# Patient Record
Sex: Female | Born: 1945 | Race: White | Hispanic: No | Marital: Single | Smoking: Never smoker
Health system: Southern US, Community
[De-identification: ages and names within clinical notes are randomized; demographics above are authoritative.]

## PROBLEM LIST (undated history)

## (undated) DIAGNOSIS — F329 Major depressive disorder, single episode, unspecified: Secondary | ICD-10-CM

## (undated) DIAGNOSIS — G4733 Obstructive sleep apnea (adult) (pediatric): Secondary | ICD-10-CM

## (undated) DIAGNOSIS — I10 Essential (primary) hypertension: Secondary | ICD-10-CM

## (undated) DIAGNOSIS — Z930 Tracheostomy status: Secondary | ICD-10-CM

## (undated) DIAGNOSIS — N189 Chronic kidney disease, unspecified: Secondary | ICD-10-CM

## (undated) DIAGNOSIS — E119 Type 2 diabetes mellitus without complications: Secondary | ICD-10-CM

---

## 2004-10-13 ENCOUNTER — Ambulatory Visit: Payer: Self-pay | Admitting: Physician Assistant

## 2004-11-29 ENCOUNTER — Other Ambulatory Visit: Payer: Self-pay

## 2004-12-28 ENCOUNTER — Ambulatory Visit: Payer: Self-pay | Admitting: Unknown Physician Specialty

## 2005-01-14 ENCOUNTER — Emergency Department: Payer: Self-pay | Admitting: Unknown Physician Specialty

## 2005-08-31 ENCOUNTER — Other Ambulatory Visit: Payer: Self-pay

## 2005-08-31 ENCOUNTER — Inpatient Hospital Stay: Payer: Self-pay | Admitting: Internal Medicine

## 2005-09-05 ENCOUNTER — Other Ambulatory Visit: Payer: Self-pay

## 2006-02-27 ENCOUNTER — Ambulatory Visit: Payer: Self-pay | Admitting: *Deleted

## 2006-04-04 ENCOUNTER — Ambulatory Visit: Payer: Self-pay | Admitting: Otolaryngology

## 2006-06-11 ENCOUNTER — Other Ambulatory Visit: Payer: Self-pay

## 2006-06-11 ENCOUNTER — Ambulatory Visit: Payer: Self-pay | Admitting: Unknown Physician Specialty

## 2006-06-18 ENCOUNTER — Other Ambulatory Visit: Payer: Self-pay

## 2006-06-18 ENCOUNTER — Inpatient Hospital Stay: Payer: Self-pay | Admitting: Unknown Physician Specialty

## 2008-10-17 ENCOUNTER — Emergency Department: Payer: Self-pay | Admitting: Emergency Medicine

## 2009-02-07 ENCOUNTER — Inpatient Hospital Stay: Payer: Self-pay | Admitting: Vascular Surgery

## 2009-02-20 ENCOUNTER — Inpatient Hospital Stay: Payer: Self-pay | Admitting: Internal Medicine

## 2009-03-17 ENCOUNTER — Ambulatory Visit: Payer: Self-pay | Admitting: Surgery

## 2009-05-26 ENCOUNTER — Ambulatory Visit: Payer: Self-pay | Admitting: Family Medicine

## 2009-10-14 ENCOUNTER — Ambulatory Visit: Payer: Self-pay | Admitting: Unknown Physician Specialty

## 2009-10-26 ENCOUNTER — Inpatient Hospital Stay: Payer: Self-pay | Admitting: Unknown Physician Specialty

## 2011-03-11 ENCOUNTER — Ambulatory Visit: Payer: Self-pay | Admitting: Internal Medicine

## 2011-03-26 ENCOUNTER — Inpatient Hospital Stay: Payer: Self-pay | Admitting: Internal Medicine

## 2011-04-11 ENCOUNTER — Ambulatory Visit: Payer: Self-pay | Admitting: Internal Medicine

## 2011-04-12 LAB — CBC WITH DIFFERENTIAL/PLATELET
Basophil %: 0.1 %
Eosinophil %: 0.8 %
HCT: 35.6 % (ref 35.0–47.0)
HGB: 11.6 g/dL — ABNORMAL LOW (ref 12.0–16.0)
Lymphocyte %: 5.9 %
MCHC: 32.6 g/dL (ref 32.0–36.0)
Monocyte %: 5.9 %
Neutrophil #: 10.4 10*3/uL — ABNORMAL HIGH (ref 1.4–6.5)
Neutrophil %: 87.3 %
RBC: 3.94 10*6/uL (ref 3.80–5.20)

## 2011-04-12 LAB — URINALYSIS, COMPLETE
Bilirubin,UR: NEGATIVE
Glucose,UR: NEGATIVE mg/dL (ref 0–75)
Leukocyte Esterase: NEGATIVE
Nitrite: NEGATIVE
RBC,UR: 328 /HPF (ref 0–5)
Specific Gravity: 1.012 (ref 1.003–1.030)
Squamous Epithelial: 2
WBC UR: 10 /HPF (ref 0–5)

## 2011-04-12 LAB — BASIC METABOLIC PANEL
Calcium, Total: 10.5 mg/dL — ABNORMAL HIGH (ref 8.5–10.1)
Co2: 31 mmol/L (ref 21–32)
Creatinine: 0.8 mg/dL (ref 0.60–1.30)
EGFR (African American): >60
EGFR (Non-African Amer.): >60
Glucose: 238 mg/dL — ABNORMAL HIGH (ref 65–99)
Potassium: 3.6 mmol/L (ref 3.5–5.1)
Sodium: 150 mmol/L — ABNORMAL HIGH (ref 136–145)

## 2011-04-13 LAB — BASIC METABOLIC PANEL
Anion Gap: 11 (ref 7–16)
BUN: 22 mg/dL — ABNORMAL HIGH (ref 7–18)
Calcium, Total: 10.2 mg/dL — ABNORMAL HIGH (ref 8.5–10.1)
Chloride: 105 mmol/L (ref 98–107)
EGFR (African American): >60
EGFR (Non-African Amer.): >60
Glucose: 261 mg/dL — ABNORMAL HIGH (ref 65–99)
Osmolality: 305 (ref 275–301)

## 2011-04-13 LAB — CBC WITH DIFFERENTIAL/PLATELET
Basophil #: 0 10*3/uL (ref 0.0–0.1)
Eosinophil #: 0.1 10*3/uL (ref 0.0–0.7)
HCT: 36.6 % (ref 35.0–47.0)
Lymphocyte #: 0.6 10*3/uL — ABNORMAL LOW (ref 1.0–3.6)
Lymphocyte %: 7.3 %
MCH: 30.1 pg (ref 26.0–34.0)
MCHC: 33.4 g/dL (ref 32.0–36.0)
MCV: 90 fL (ref 80–100)
Monocyte #: 0.5 10*3/uL (ref 0.0–0.7)
Monocyte %: 6 %
Neutrophil #: 7.3 10*3/uL — ABNORMAL HIGH (ref 1.4–6.5)
RBC: 4.07 10*6/uL (ref 3.80–5.20)
RDW: 14.3 % (ref 11.5–14.5)

## 2011-04-14 LAB — BASIC METABOLIC PANEL
Calcium, Total: 10.2 mg/dL — ABNORMAL HIGH (ref 8.5–10.1)
Creatinine: 0.64 mg/dL (ref 0.60–1.30)
EGFR (Non-African Amer.): >60
Glucose: 204 mg/dL — ABNORMAL HIGH (ref 65–99)
Potassium: 3.2 mmol/L — ABNORMAL LOW (ref 3.5–5.1)
Sodium: 146 mmol/L — ABNORMAL HIGH (ref 136–145)

## 2011-04-14 LAB — CBC WITH DIFFERENTIAL/PLATELET
Basophil #: 0 10*3/uL (ref 0.0–0.1)
Eosinophil #: 0.1 10*3/uL (ref 0.0–0.7)
HGB: 11.8 g/dL — ABNORMAL LOW (ref 12.0–16.0)
Lymphocyte #: 0.6 10*3/uL — ABNORMAL LOW (ref 1.0–3.6)
Lymphocyte %: 8.5 %
MCH: 29.9 pg (ref 26.0–34.0)
MCHC: 33.2 g/dL (ref 32.0–36.0)
Monocyte %: 7.7 %
Neutrophil #: 5.5 10*3/uL (ref 1.4–6.5)
Platelet: 254 10*3/uL (ref 150–440)
RDW: 13.7 % (ref 11.5–14.5)
WBC: 6.7 10*3/uL (ref 3.6–11.0)

## 2011-04-14 LAB — PROTIME-INR
INR: 1.1
Prothrombin Time: 14.5 secs (ref 11.5–14.7)

## 2011-04-15 LAB — CBC WITH DIFFERENTIAL/PLATELET
Basophil %: 0.3 %
Eosinophil %: 2.6 %
HGB: 11.7 g/dL — ABNORMAL LOW (ref 12.0–16.0)
Lymphocyte #: 0.6 10*3/uL — ABNORMAL LOW (ref 1.0–3.6)
Lymphocyte %: 8.9 %
MCH: 29.9 pg (ref 26.0–34.0)
MCV: 90 fL (ref 80–100)
Monocyte #: 0.4 10*3/uL (ref 0.0–0.7)
Neutrophil #: 5.3 10*3/uL (ref 1.4–6.5)
Neutrophil %: 81.8 %
Platelet: 237 10*3/uL (ref 150–440)
RBC: 3.9 10*6/uL (ref 3.80–5.20)
WBC: 6.5 10*3/uL (ref 3.6–11.0)

## 2011-04-15 LAB — BASIC METABOLIC PANEL
Anion Gap: 10 (ref 7–16)
BUN: 19 mg/dL — ABNORMAL HIGH (ref 7–18)
Chloride: 105 mmol/L (ref 98–107)
Co2: 28 mmol/L (ref 21–32)
EGFR (Non-African Amer.): >60
Osmolality: 294 (ref 275–301)
Potassium: 3.4 mmol/L — ABNORMAL LOW (ref 3.5–5.1)
Sodium: 143 mmol/L (ref 136–145)

## 2011-04-15 LAB — APTT
Activated PTT: 66.5 secs — ABNORMAL HIGH (ref 23.6–35.9)
Activated PTT: 92.1 secs — ABNORMAL HIGH (ref 23.6–35.9)

## 2011-04-16 LAB — BASIC METABOLIC PANEL
Anion Gap: 13 (ref 7–16)
BUN: 18 mg/dL (ref 7–18)
Calcium, Total: 9.9 mg/dL (ref 8.5–10.1)
Co2: 26 mmol/L (ref 21–32)
Creatinine: 0.71 mg/dL (ref 0.60–1.30)
EGFR (African American): >60
EGFR (Non-African Amer.): >60
Glucose: 172 mg/dL — ABNORMAL HIGH (ref 65–99)
Osmolality: 291 (ref 275–301)
Potassium: 3.2 mmol/L — ABNORMAL LOW (ref 3.5–5.1)

## 2011-04-16 LAB — CBC WITH DIFFERENTIAL/PLATELET
Basophil #: 0 10*3/uL (ref 0.0–0.1)
Basophil %: 0.3 %
Eosinophil %: 1.7 %
HCT: 35.8 % (ref 35.0–47.0)
HGB: 11.9 g/dL — ABNORMAL LOW (ref 12.0–16.0)
Lymphocyte %: 9.1 %
MCH: 29.5 pg (ref 26.0–34.0)
MCV: 89 fL (ref 80–100)
Monocyte %: 6.8 %
Neutrophil %: 82.1 %
Platelet: 235 10*3/uL (ref 150–440)
RBC: 4.03 10*6/uL (ref 3.80–5.20)

## 2011-04-16 LAB — APTT
Activated PTT: 117.1 secs — ABNORMAL HIGH (ref 23.6–35.9)
Activated PTT: 82.8 secs — ABNORMAL HIGH (ref 23.6–35.9)

## 2011-04-17 LAB — CBC WITH DIFFERENTIAL/PLATELET
Basophil #: 0 10*3/uL (ref 0.0–0.1)
Eosinophil #: 0.1 10*3/uL (ref 0.0–0.7)
Eosinophil %: 1.5 %
Lymphocyte #: 0.5 10*3/uL — ABNORMAL LOW (ref 1.0–3.6)
Lymphocyte %: 7.4 %
MCHC: 33.1 g/dL (ref 32.0–36.0)
Monocyte %: 5.8 %
Neutrophil %: 85.1 %
Platelet: 227 10*3/uL (ref 150–440)
RBC: 3.94 10*6/uL (ref 3.80–5.20)
RDW: 14.1 % (ref 11.5–14.5)
WBC: 6.7 10*3/uL (ref 3.6–11.0)

## 2011-04-17 LAB — BASIC METABOLIC PANEL
Calcium, Total: 9.7 mg/dL (ref 8.5–10.1)
Chloride: 107 mmol/L (ref 98–107)
Co2: 26 mmol/L (ref 21–32)
EGFR (Non-African Amer.): >60
Glucose: 177 mg/dL — ABNORMAL HIGH (ref 65–99)
Potassium: 3.4 mmol/L — ABNORMAL LOW (ref 3.5–5.1)
Sodium: 144 mmol/L (ref 136–145)

## 2011-04-17 LAB — MAGNESIUM
Magnesium: 1.5 mg/dL — ABNORMAL LOW
Magnesium: 1.9 mg/dL

## 2011-04-17 LAB — APTT: Activated PTT: 32.9 secs (ref 23.6–35.9)

## 2011-04-18 LAB — BASIC METABOLIC PANEL
Anion Gap: 11 (ref 7–16)
BUN: 13 mg/dL (ref 7–18)
EGFR (African American): >60
EGFR (Non-African Amer.): >60
Osmolality: 292 (ref 275–301)
Potassium: 3.3 mmol/L — ABNORMAL LOW (ref 3.5–5.1)
Sodium: 146 mmol/L — ABNORMAL HIGH (ref 136–145)

## 2011-04-18 LAB — MAGNESIUM: Magnesium: 1.6 mg/dL — ABNORMAL LOW

## 2011-04-18 LAB — CULTURE, BLOOD (SINGLE)

## 2011-04-19 LAB — MAGNESIUM
Magnesium: 1.6 mg/dL — ABNORMAL LOW
Magnesium: 3.4 mg/dL — ABNORMAL HIGH

## 2011-04-19 LAB — POTASSIUM
Potassium: 3.4 mmol/L — ABNORMAL LOW (ref 3.5–5.1)
Potassium: 3.8 mmol/L (ref 3.5–5.1)

## 2011-04-19 LAB — PROTIME-INR
INR: 1.2
Prothrombin Time: 16 secs — ABNORMAL HIGH (ref 11.5–14.7)

## 2011-04-20 ENCOUNTER — Ambulatory Visit (HOSPITAL_COMMUNITY)
Admission: AD | Admit: 2011-04-20 | Discharge: 2011-04-20 | Disposition: A | Discharge status: Home / Self Care | Payer: Self-pay | Source: Other Acute Inpatient Hospital | Attending: Internal Medicine | Admitting: Internal Medicine

## 2011-04-20 ENCOUNTER — Other Ambulatory Visit: Payer: Self-pay

## 2011-04-20 ENCOUNTER — Other Ambulatory Visit (HOSPITAL_COMMUNITY): Payer: Self-pay

## 2011-04-20 ENCOUNTER — Inpatient Hospital Stay
Admission: AD | Admit: 2011-04-20 | Discharge: 2011-05-19 | Disposition: A | Discharge status: Skilled Nursing Facility | Payer: Self-pay | Source: Ambulatory Visit | Attending: Internal Medicine | Admitting: Internal Medicine

## 2011-04-20 DIAGNOSIS — J9600 Acute respiratory failure, unspecified whether with hypoxia or hypercapnia: Secondary | ICD-10-CM | POA: Insufficient documentation

## 2011-04-20 DIAGNOSIS — Z9911 Dependence on respirator [ventilator] status: Secondary | ICD-10-CM | POA: Insufficient documentation

## 2011-04-20 LAB — COMPREHENSIVE METABOLIC PANEL
ALT: 26 U/L (ref 0–35)
AST: 16 U/L (ref 0–37)
Calcium: 10.9 mg/dL — ABNORMAL HIGH (ref 8.4–10.5)
GFR calc Af Amer: >90 mL/min (ref >90)
Sodium: 139 mEq/L (ref 135–145)
Total Protein: 6.5 g/dL (ref 6.0–8.3)

## 2011-04-20 LAB — PROTIME-INR
INR: 1.5
INR: 1.57 — ABNORMAL HIGH (ref 0.00–1.49)
Prothrombin Time: 18.9 secs — ABNORMAL HIGH (ref 11.5–14.7)

## 2011-04-20 LAB — CBC
MCH: 30.1 pg (ref 26.0–34.0)
MCHC: 34.2 g/dL (ref 30.0–36.0)
Platelets: 242 10*3/uL (ref 150–400)

## 2011-04-20 LAB — VANCOMYCIN, TROUGH: Vancomycin, Trough: 16 ug/mL (ref 10–20)

## 2011-04-20 LAB — BASIC METABOLIC PANEL
BUN: 16 mg/dL (ref 7–18)
Calcium, Total: 10.2 mg/dL — ABNORMAL HIGH (ref 8.5–10.1)
EGFR (African American): >60
EGFR (Non-African Amer.): >60
Glucose: 162 mg/dL — ABNORMAL HIGH (ref 65–99)
Osmolality: 293 (ref 275–301)
Potassium: 3.5 mmol/L (ref 3.5–5.1)

## 2011-04-20 LAB — MAGNESIUM
Magnesium: 1.7 mg/dL — ABNORMAL LOW
Magnesium: 1.9 mg/dL (ref 1.5–2.5)

## 2011-04-20 LAB — PHOSPHORUS: Phosphorus: 2.7 mg/dL (ref 2.3–4.6)

## 2011-04-20 LAB — CLOSTRIDIUM DIFFICILE BY PCR

## 2011-04-21 LAB — CBC
MCHC: 32.4 g/dL (ref 30.0–36.0)
MCV: 88.9 fL (ref 78.0–100.0)
Platelets: 230 10*3/uL (ref 150–400)
RDW: 14.3 % (ref 11.5–15.5)
WBC: 8.1 10*3/uL (ref 4.0–10.5)

## 2011-04-21 LAB — TYPE AND SCREEN: ABO/RH(D): O POS

## 2011-04-21 LAB — PREPARE FRESH FROZEN PLASMA: Unit division: 0

## 2011-04-21 LAB — ABO/RH: ABO/RH(D): O POS

## 2011-04-22 ENCOUNTER — Other Ambulatory Visit: Payer: Self-pay

## 2011-04-22 LAB — CBC
HCT: 29.4 % — ABNORMAL LOW (ref 36.0–46.0)
MCHC: 35.7 g/dL (ref 30.0–36.0)
MCV: 90.2 fL (ref 78.0–100.0)
RDW: 14.7 % (ref 11.5–15.5)

## 2011-04-22 LAB — BASIC METABOLIC PANEL
BUN: 12 mg/dL (ref 6–23)
CO2: 30 mEq/L (ref 19–32)
Chloride: 105 mEq/L (ref 96–112)
Creatinine, Ser: 0.54 mg/dL (ref 0.50–1.10)
GFR calc Af Amer: >90 mL/min (ref >90)
Glucose, Bld: 163 mg/dL — ABNORMAL HIGH (ref 70–99)
Potassium: 3.8 mEq/L (ref 3.5–5.1)

## 2011-04-22 LAB — PROTIME-INR: INR: 1.2 (ref 0.00–1.49)

## 2011-04-23 LAB — PROTIME-INR
INR: 1.06 (ref 0.00–1.49)
Prothrombin Time: 14 seconds (ref 11.6–15.2)

## 2011-04-23 LAB — CBC
MCH: 29.3 pg (ref 26.0–34.0)
MCHC: 32.1 g/dL (ref 30.0–36.0)
Platelets: 205 10*3/uL (ref 150–400)
RBC: 3.38 MIL/uL — ABNORMAL LOW (ref 3.87–5.11)

## 2011-04-23 LAB — BASIC METABOLIC PANEL
Calcium: 10.4 mg/dL (ref 8.4–10.5)
GFR calc Af Amer: >90 mL/min (ref >90)
GFR calc non Af Amer: >90 mL/min (ref >90)
Sodium: 140 mEq/L (ref 135–145)

## 2011-04-24 ENCOUNTER — Other Ambulatory Visit (HOSPITAL_COMMUNITY): Payer: Self-pay

## 2011-04-24 DIAGNOSIS — J9620 Acute and chronic respiratory failure, unspecified whether with hypoxia or hypercapnia: Secondary | ICD-10-CM

## 2011-04-24 DIAGNOSIS — Z930 Tracheostomy status: Secondary | ICD-10-CM

## 2011-04-24 DIAGNOSIS — J189 Pneumonia, unspecified organism: Secondary | ICD-10-CM

## 2011-04-24 LAB — URINALYSIS, ROUTINE W REFLEX MICROSCOPIC
Bilirubin Urine: NEGATIVE
Nitrite: NEGATIVE
Protein, ur: NEGATIVE mg/dL
Urobilinogen, UA: 0.2 mg/dL (ref 0.0–1.0)

## 2011-04-24 LAB — BASIC METABOLIC PANEL
BUN: 18 mg/dL (ref 6–23)
Chloride: 101 mEq/L (ref 96–112)
GFR calc Af Amer: >90 mL/min (ref >90)
GFR calc non Af Amer: >90 mL/min (ref >90)
Potassium: 4.2 mEq/L (ref 3.5–5.1)

## 2011-04-24 LAB — URINE CULTURE
Culture  Setup Time: 201301141658
Culture: NO GROWTH

## 2011-04-24 LAB — CBC
HCT: 32.2 % — ABNORMAL LOW (ref 36.0–46.0)
MCHC: 32.3 g/dL (ref 30.0–36.0)
Platelets: 193 10*3/uL (ref 150–400)
RDW: 14.3 % (ref 11.5–15.5)
WBC: 6.8 10*3/uL (ref 4.0–10.5)

## 2011-04-24 LAB — PROTIME-INR
INR: 1 (ref 0.00–1.49)
Prothrombin Time: 13.4 seconds (ref 11.6–15.2)

## 2011-04-24 LAB — PROCALCITONIN: Procalcitonin: <0.1 ng/mL

## 2011-04-24 LAB — CULTURE, BLOOD (ROUTINE X 2): Culture  Setup Time: 201301142207

## 2011-04-24 LAB — PTH, INTACT AND CALCIUM: Calcium, Total (PTH): 9.6 mg/dL (ref 8.4–10.5)

## 2011-04-24 LAB — URINE MICROSCOPIC-ADD ON

## 2011-04-24 NOTE — Consult Note (Signed)
Name: Rachel Brady MRN: [id number] DOB: 01/08/1946    LOS: 4  PCCM  CONSULT  NOTE  History of Present Illness: 66 yo f with hx of drug OD ? Suicide attempt. Treated at [facility] and required tracheostomy and full vent support. Tx to [facility] 04/20/11 and PCCM asked to consult 04/24/11 for ventilator mangement.  Lines / Drains: Trach>> Rt ij cvl>>  Cultures: none  Antibiotics: 1/11 diflucan>> 1/10 primaxin>> 1/10 zyvox>>reviewed  Tests / Events:   The patient is on full vent support.  PMH: ARF on vent MRSA PNA Drug OD LLE cellulitis CAD CHF CAF OSA DJD TME Fungal groin rash Lower ext ulcers   Allergies nnone Family History reviewed Social History Hx substance abuse Review Of Systems  na  Vital Signs:  reviewed Physical Examination: General: MO WF, follows commands by MAE x 4 Neuro:  As above   HEENT:  trch-> vent   Cardiovascular hsir Lungs:  Coarse rhonchi bilat Abdomen: obese +bs Musculoskeletal:  reviewed Skin:  Rt breast with fungus, Bilat lower ext PVD changes  Ventilator settings: PS wean ongoing per protocol  Labs and Imaging:     Lab 04/24/11 0430 04/23/11 0539 04/22/11 0702  NA 138 140 142  K 4.2 4.0 3.8  CL 101 101 105  CO2 31 32 30  BUN 18 13 12   CREATININE 0.55 0.48* 0.54  GLUCOSE 232* 204* 163*    Lab 04/24/11 0430 04/23/11 0539 04/22/11 0933  HGB 10.4* 9.9* 10.5*  HCT 32.2* 30.8* 29.4*  WBC 6.8 5.9 6.7  PLT 193 205 372   No results found. .  Assessment and Plan: VDRF multifactorial. OD. MO, Pna. -wean per protocol -abx established per primary. -repeat cultures and CxR\   CAF -per IM  LE PVD -per IM  Brett Canales Minor ACNP Adolph Pollack PCCM Pager [phone] till 3 pm If no answer page [phone] 04/24/2011, 10:47 AM  Patient seen and examined, agree with above note.  I dictated the care and orders written for this patient under my direction.  Koren Bound, M.D. [phone]

## 2011-04-25 LAB — PROTIME-INR: Prothrombin Time: 14.6 seconds (ref 11.6–15.2)

## 2011-04-26 DIAGNOSIS — J189 Pneumonia, unspecified organism: Secondary | ICD-10-CM

## 2011-04-26 DIAGNOSIS — Z930 Tracheostomy status: Secondary | ICD-10-CM

## 2011-04-26 DIAGNOSIS — J9620 Acute and chronic respiratory failure, unspecified whether with hypoxia or hypercapnia: Secondary | ICD-10-CM

## 2011-04-26 LAB — DIFFERENTIAL
Basophils Absolute: 0 10*3/uL (ref 0.0–0.1)
Basophils Relative: 0 % (ref 0–1)
Neutro Abs: 4.3 10*3/uL (ref 1.7–7.7)
Neutrophils Relative %: 76 % (ref 43–77)

## 2011-04-26 LAB — PROTIME-INR
INR: 1.04 (ref 0.00–1.49)
Prothrombin Time: 13.8 seconds (ref 11.6–15.2)

## 2011-04-26 LAB — CBC
MCHC: 32.1 g/dL (ref 30.0–36.0)
Platelets: 200 10*3/uL (ref 150–400)
RDW: 14 % (ref 11.5–15.5)
WBC: 5.6 10*3/uL (ref 4.0–10.5)

## 2011-04-26 NOTE — Progress Notes (Addendum)
Name: TAPANGA OTTAWAY MRN: [id number] DOB: [birth year]    LOS: 6  PCCM  CONSULT  NOTE  History of Present Illness: 66 yo f with hx of drug OD ? Suicide attempt. Treated at [facility] and required tracheostomy and full vent support. Tx to [facility] 04/20/11 and PCCM asked to consult 04/24/11 for ventilator mangement.  Lines / Drains: Trach>> Rt ij cvl>>  Cultures: none  Antibiotics: 1/11 diflucan>> 1/10 primaxin>> 1/10 zyvox>>reviewed  Tests / Events:   The patient is on full vent support.   Ventilator settings: PS wean ongoing per protocol  Labs and Imaging:     Lab 04/24/11 0430 04/23/11 0539 04/22/11 0702  NA 138 140 142  K 4.2 4.0 3.8  CL 101 101 105  CO2 31 32 30  BUN 18 13 12   CREATININE 0.55 0.48* 0.54  GLUCOSE 232* 204* 163*    Lab 04/26/11 0524 04/24/11 0430 04/23/11 0539  HGB 10.5* 10.4* 9.9*  HCT 32.7* 32.2* 30.8*  WBC 5.6 6.8 5.9  PLT 200 193 205   Dg Chest Portable 1 View  04/24/2011  *RADIOLOGY REPORT*  Clinical Data: vdrf  PORTABLE CHEST - 1 VIEW  Comparison: 04/20/2011  Findings: Megaly again noted.  Stable right IJ central line position.  Stable tracheostomy tube position.  Central mild vascular congestion without convincing edema.  Mild basilar atelectasis.  IMPRESSION: Central vascular congestion without convincing edema.  Basilar atelectasis.  Original Report Authenticated By: Natasha Mead, M.D.   .  Assessment and Plan: VDRF multifactorial. OD. MO, Pna. -wean per protocol -abx established per primary. -repeat cultures and CxR\   CAF -per IM  LE PVD -per IM  Brett Canales Minor ACNP Adolph Pollack PCCM Pager [phone] till 3 pm If no answer page [phone] 04/26/2011, 10:43 AM  Patient seen and examined, agree with above note.  I dictated the care and orders written for this patient under my direction.  Koren Bound, M.D. [phone]

## 2011-04-27 ENCOUNTER — Other Ambulatory Visit (HOSPITAL_COMMUNITY): Payer: Self-pay

## 2011-04-27 LAB — PROTIME-INR
INR: 1.38 (ref 0.00–1.49)
Prothrombin Time: 17.2 seconds — ABNORMAL HIGH (ref 11.6–15.2)

## 2011-04-28 ENCOUNTER — Other Ambulatory Visit (HOSPITAL_COMMUNITY): Payer: Self-pay

## 2011-04-28 DIAGNOSIS — J189 Pneumonia, unspecified organism: Secondary | ICD-10-CM

## 2011-04-28 DIAGNOSIS — J9620 Acute and chronic respiratory failure, unspecified whether with hypoxia or hypercapnia: Secondary | ICD-10-CM

## 2011-04-28 DIAGNOSIS — Z930 Tracheostomy status: Secondary | ICD-10-CM

## 2011-04-28 LAB — PROTIME-INR: Prothrombin Time: 19.7 seconds — ABNORMAL HIGH (ref 11.6–15.2)

## 2011-04-28 NOTE — Progress Notes (Addendum)
Name: Rachel Brady MRN: [id number] DOB: 08/16/1945    LOS: 8  PCCM  PROGRESS  NOTE  History of Present Illness: 66 yo f with hx of drug OD ? Suicide attempt. Treated at [facility] and required tracheostomy and full vent support. Tx to [facility] 04/20/11 and PCCM asked to consult 04/24/11 for ventilator mangement.  Lines / Drains: Trach>> Rt ij cvl>>  Cultures: none  Antibiotics: 1/11 diflucan>> 1/10 primaxin>> 1/10 zyvox>>reviewed  Tests / Events:   The patient is on full vent support.  PMH: ARF on vent MRSA PNA Drug OD LLE cellulitis CAD CHF CAF OSA DJD TME Fungal groin rash Lower ext ulcers   Allergies nnone Family History reviewed Social History Hx substance abuse Review Of Systems  na  Vital Signs:  reviewed Physical Examination: General: MO WF, follows commands by MAE x 4, comfortable on vent Neuro:  As above   HEENT:  trch-> vent   Cardiovascular hsir Lungs:  Coarse rhonchi bilat Abdomen: obese +bs Musculoskeletal:  reviewed Skin:  Rt breast with fungus, Bilat lower ext PVD changes  Ventilator settings: PS wean ongoing per protocol  Labs and Imaging:     Lab 04/24/11 0430 04/23/11 0539 04/22/11 0702  NA 138 140 142  K 4.2 4.0 3.8  CL 101 101 105  CO2 31 32 30  BUN 18 13 12   CREATININE 0.55 0.48* 0.54  GLUCOSE 232* 204* 163*    Lab 04/26/11 0524 04/24/11 0430 04/23/11 0539  HGB 10.5* 10.4* 9.9*  HCT 32.7* 32.2* 30.8*  WBC 5.6 6.8 5.9  PLT 200 193 205   Dg Chest Port 1 View  04/27/2011  *RADIOLOGY REPORT*  Clinical Data: Respiratory failure.  PORTABLE CHEST - 1 VIEW  Comparison: 04/24/2011  Findings: The patient is rotated to the right on today's exam, resulting in reduced diagnostic sensitivity and specificity. Right IJ line tip projects over the SVC. Tracheostomy tube noted.  Cardiomegaly noted.  There is obscuration of the left hemidiaphragm.  Left rib deformities reflect prior healed fractures.  Low lung volumes are present, causing  crowding of the pulmonary vasculature.  Indistinct opacity at the right lung base likely represents combination of atelectasis and vasculature.  IMPRESSION:  1.  Cardiomegaly, without overt edema. 2.  Left lower lobe airspace opacity, potentially from atelectasis or pneumonia.  Minimal atelectasis in the right lower lobe. 3.  Prominent rightward rotation reduces diagnostic sensitivity.  Original Report Authenticated By: Dellia Cloud, M.D.   .  Assessment and Plan: VDRF multifactorial. OD. MO, Pna. -wean per protocol -abx established per primary. -repeat cultures and CxR\   CAF -per IM  LE PVD -per IM  Brett Canales Minor ACNP Adolph Pollack PCCM Pager [phone] till 3 pm If no answer page [phone] 04/28/2011, 10:07 AM  Patient seen and examined, agree with above note.  I dictated the care and orders written for this patient under my direction.  Koren Bound, M.D. [phone]

## 2011-04-29 LAB — PROTIME-INR: Prothrombin Time: 20.9 seconds — ABNORMAL HIGH (ref 11.6–15.2)

## 2011-04-30 LAB — BASIC METABOLIC PANEL
BUN: 32 mg/dL — ABNORMAL HIGH (ref 6–23)
Calcium: 11.4 mg/dL — ABNORMAL HIGH (ref 8.4–10.5)
GFR calc non Af Amer: >90 mL/min (ref >90)
Glucose, Bld: 233 mg/dL — ABNORMAL HIGH (ref 70–99)
Sodium: 139 mEq/L (ref 135–145)

## 2011-04-30 LAB — CBC
Hemoglobin: 11.9 g/dL — ABNORMAL LOW (ref 12.0–15.0)
MCH: 29.2 pg (ref 26.0–34.0)
MCHC: 32.1 g/dL (ref 30.0–36.0)

## 2011-04-30 LAB — PROTIME-INR: INR: 2.31 — ABNORMAL HIGH (ref 0.00–1.49)

## 2011-05-01 ENCOUNTER — Other Ambulatory Visit (HOSPITAL_COMMUNITY): Payer: Self-pay

## 2011-05-01 DIAGNOSIS — Z930 Tracheostomy status: Secondary | ICD-10-CM

## 2011-05-01 DIAGNOSIS — J189 Pneumonia, unspecified organism: Secondary | ICD-10-CM

## 2011-05-01 DIAGNOSIS — J9620 Acute and chronic respiratory failure, unspecified whether with hypoxia or hypercapnia: Secondary | ICD-10-CM

## 2011-05-01 NOTE — Progress Notes (Signed)
Name: Rachel Brady MRN: [id number] DOB: June 19, 1945    LOS: 11  PCCM  PROGRESS  NOTE  History of Present Illness: 66 yo f with hx of drug OD ? Suicide attempt. Treated at [facility] and required tracheostomy and full vent support. Tx to [facility] 04/20/11 and PCCM asked to consult 04/24/11 for ventilator mangement.  Lines / Drains: Trach>> Rt ij cvl>>  Cultures: none  Antibiotics: 1/11 diflucan>> 1/10 primaxin>> 1/10 zyvox>>off  Tests / Events:   Vital Signs:  reviewed  Physical Examination: General: chronically ill appearing female, NAD OOB in chair Neuro: awake and alert, appropriate, MAE CV: s1s2 rrr, no m/r/g PULM: resps even non labored on ATC with PMV, dimininshed bases, few scattered ronchi  GI: abd soft, +bs Extremities: warm and dry, no edema    Ventilator settings: ATC wean - tol ATC since 1/20  Labs and Imaging:     Lab 04/30/11 0618  NA 139  K 3.8  CL 96  CO2 33*  BUN 32*  CREATININE 0.55  GLUCOSE 233*    Lab 04/30/11 0618 04/26/11 0524  HGB 11.9* 10.5*  HCT 37.1 32.7*  WBC 6.4 5.6  PLT 192 200   Dg Chest Port 1 View  05/01/2011  *RADIOLOGY REPORT*  Clinical Data: Fever  PORTABLE CHEST - 1 VIEW  Comparison: Multiple priors, most recently a chest x-ray 04/28/2011.  Findings: Previously noted support apparatus are unchanged.  Lung volumes remain low.  There continues to be complete obscuration of the left hemidiaphragm and retrocardiac structures.  Blunting left costophrenic sulcus is again noted. There are increasing opacities in the medial aspect of the right lung apex and right lung base, which may reflect additional areas of worsening atelectasis and/or consolidation.  Pulmonary vascular crowding without frank pulmonary edema.  Heart size is enlarged (unchanged).  Atherosclerosis of the arch of the aorta.  IMPRESSION:  1.  While today's study appears similar to prior examinations, with complete opacification in the region of the left lower lobe (concerning  for combination of atelectasis and consolidation from infection or aspiration) there is worsening aeration in the medial aspect of the right apex and right base, concerning for additional areas of atelectasis and/or consolidation.  Original Report Authenticated By: Florencia Reasons, M.D.    Assessment and Plan: VDRF multifactorial in setting ? Drug OD, PNA, OSA.  Now tol ATC since 1/20.  Passed swallow eval will start po diet 1/21. CXR slightly worse aeration R and cont opacification LLL.   PLAN -  - cont ATC as tol  -abx per primary. -repeat cultures and CxR -would recommend at least PS qhs for now with hx OSA and worsening atx/collapse on CXR   CAF -per IM  LE PVD -per IM  WHITEHEART,KATHRYN, NP 05/01/2011  9:10 AM  *Care during the described time interval was provided by me and/or other providers on the critical care team. I have reviewed this patient's available data, including medical history, events of note, physical examination and test results as part of my evaluation.    Pt seen and examined and database reviewed. I agree with above findings, assessment and plan Billy Fischer, MD;  PCCM service; Mobile [phone]

## 2011-05-02 LAB — CBC
HCT: 36.1 % (ref 36.0–46.0)
Hemoglobin: 12 g/dL (ref 12.0–15.0)
MCH: 29.6 pg (ref 26.0–34.0)
MCHC: 33.2 g/dL (ref 30.0–36.0)
MCV: 89.1 fL (ref 78.0–100.0)

## 2011-05-02 LAB — BASIC METABOLIC PANEL
BUN: 38 mg/dL — ABNORMAL HIGH (ref 6–23)
Chloride: 96 mEq/L (ref 96–112)
Glucose, Bld: 168 mg/dL — ABNORMAL HIGH (ref 70–99)
Potassium: 3.7 mEq/L (ref 3.5–5.1)

## 2011-05-03 DIAGNOSIS — J189 Pneumonia, unspecified organism: Secondary | ICD-10-CM

## 2011-05-03 DIAGNOSIS — Z930 Tracheostomy status: Secondary | ICD-10-CM

## 2011-05-03 DIAGNOSIS — J9620 Acute and chronic respiratory failure, unspecified whether with hypoxia or hypercapnia: Secondary | ICD-10-CM

## 2011-05-03 NOTE — Progress Notes (Signed)
Name: Rachel Brady MRN: [id number] DOB: 1946-02-11    LOS: 13  PCCM  PROGRESS  NOTE  History of Present Illness: 66 yo f with hx of drug OD ? Suicide attempt. Treated at [facility] and required tracheostomy and full vent support. Tx to [facility] 04/20/11 and PCCM asked to consult 04/24/11 for ventilator mangement.  Lines / Drains: Trach>> Rt ij cvl>>  Cultures: none  Antibiotics: 1/11 diflucan>>off 1/10 primaxin>>off 1/10 zyvox>>off  Tests / Events:   Vital Signs: Vital signs reviewed. Abnormal values will appear under impression plan section.    Physical Examination: General: chronically ill appearing female,  Neuro: awake and alert, appropriate, MAE CV: s1s2 rrr, no m/r/g PULM: resps even non labored on ATC with PMV, dimininshed bases, few scattered rhonchi. Remain on full vent support nocturnally  GI: abd soft, +bs Extremities: warm and dry, no edema    Ventilator settings: ATC wean - tol ATC since 1/20  Labs and Imaging:     Lab 05/02/11 0529 04/30/11 0618  NA 138 139  K 3.7 3.8  CL 96 96  CO2 31 33*  BUN 38* 32*  CREATININE 0.56 0.55  GLUCOSE 168* 233*    Lab 05/02/11 0529 04/30/11 0618  HGB 12.0 11.9*  HCT 36.1 37.1  WBC 9.0 6.4  PLT 196 192   No results found.   Assessment and Plan: VDRF multifactorial in setting ? Drug OD, PNA, OSA.  Now tol ATC since 1/20.  Passed swallow eval will start po diet 1/21. CXR slightly worse aeration R and cont opacification LLL.   PLAN -  - cont ATC as tol  -abx per primary. -repeat cultures and CxR -would recommend at least PS qhs for now with hx OSA and worsening atx/collapse on CXR -repeat c x r 1/25   CAF -per IM  LE PVD -per IM   Brett Canales Minor ACNP Adolph Pollack PCCM Pager [phone] till 3 pm If no answer page [phone] 05/03/2011, 9:16 AM  ATTENDING MD ADDENDUM: Pt seen and examined and database reviewed. I agree with above findings, assessment and plan  Billy Fischer, MD;  PCCM service; Mobile  [phone]

## 2011-05-04 ENCOUNTER — Other Ambulatory Visit (HOSPITAL_COMMUNITY): Payer: Self-pay

## 2011-05-04 LAB — PROTIME-INR
INR: 3.38 — ABNORMAL HIGH (ref 0.00–1.49)
Prothrombin Time: 34.7 seconds — ABNORMAL HIGH (ref 11.6–15.2)

## 2011-05-05 ENCOUNTER — Other Ambulatory Visit (HOSPITAL_COMMUNITY): Payer: Self-pay

## 2011-05-05 DIAGNOSIS — J9620 Acute and chronic respiratory failure, unspecified whether with hypoxia or hypercapnia: Secondary | ICD-10-CM

## 2011-05-05 DIAGNOSIS — Z930 Tracheostomy status: Secondary | ICD-10-CM

## 2011-05-05 DIAGNOSIS — J189 Pneumonia, unspecified organism: Secondary | ICD-10-CM

## 2011-05-05 LAB — PROTIME-INR: INR: 3.34 — ABNORMAL HIGH (ref 0.00–1.49)

## 2011-05-05 NOTE — Progress Notes (Signed)
Name: Rachel Brady MRN: [id number] DOB: May 19, 1945    LOS: 15  PCCM  PROGRESS  NOTE  History of Present Illness: 66 y/o F with hx of drug OD ? Suicide attempt. Treated at [facility] and required tracheostomy and full vent support. Tx to [facility] 04/20/11 and PCCM asked to consult 04/24/11 for ventilator mangement.  Lines / Drains: Trach>>  Cultures: none  Antibiotics: 1/11 diflucan>>off 1/10 primaxin>>off 1/10 zyvox>>off  Tests / Events: Trach collar tolerated  Vital Signs: Vital signs reviewed. Abnormal values will appear under impression plan section.    Physical Examination: General: chronically ill appearing female,  Neuro: awake and alert, appropriate, MAE CV: s1s2 rrr, no m/r/g PULM: resps even non labored on ATC with PMV, dimininshed bases, few scattered rhonchi.  GI: abd soft, +bs Extremities: warm and dry, no edema    Ventilator settings: ATC wean - tol ATC since 1/20, 28%  Labs and Imaging:     Lab 05/02/11 0529 04/30/11 0618  NA 138 139  K 3.7 3.8  CL 96 96  CO2 31 33*  BUN 38* 32*  CREATININE 0.56 0.55  GLUCOSE 168* 233*    Lab 05/02/11 0529 04/30/11 0618  HGB 12.0 11.9*  HCT 36.1 37.1  WBC 9.0 6.4  PLT 196 192   Dg Chest Port 1 View  05/05/2011  *RADIOLOGY REPORT*  Clinical Data: Respiratory distress  PORTABLE CHEST - 1 VIEW  Comparison: 05/01/2011  Findings: Cardiomediastinal silhouette is stable.  Tracheostomy tube is unchanged in position.  Stable right IJ central line position.  There is slight improvement in aeration without pulmonary edema.  Old left rib fractures are stable.  Residual left basilar and medial right base atelectasis or infiltrate.  IMPRESSION: Stable tracheostomy tube position.   There is slight improvement in aeration without pulmonary edema.  Old left rib fractures are stable.  Residual left basilar and medial right base atelectasis or infiltrate.  Original Report Authenticated By: Natasha Mead, M.D.     Assessment and Plan: VDRF  multifactorial in setting ? Drug OD, PNA, OSA.   Assessment:  Now tol ATC since 1/20.  Passed swallow eval will start po diet 1/21. CXR slightly worse aeration R and cont opacification LLL.   PLAN -  -cont ATC as tol --on 28% -abx per primary -repeat CXR PRN, today improved  Hilar changes -downsize to #6 cuffed trach 1/25 Requires noctunal support for now,  Next week may consider to assess off vent at night  CAF -per IM  LE PVD -per IM   Canary Brim, NP-C [facility] Pgr: [phone]  Mcarthur Rossetti. Tyson Alias, MD, FACP Pgr: [phone] [place] [facility]

## 2011-05-06 ENCOUNTER — Other Ambulatory Visit (HOSPITAL_COMMUNITY): Payer: Self-pay

## 2011-05-06 LAB — PROTIME-INR: Prothrombin Time: 25.9 seconds — ABNORMAL HIGH (ref 11.6–15.2)

## 2011-05-07 LAB — BASIC METABOLIC PANEL
Calcium: 10.8 mg/dL — ABNORMAL HIGH (ref 8.4–10.5)
Creatinine, Ser: 0.59 mg/dL (ref 0.50–1.10)
GFR calc non Af Amer: >90 mL/min (ref >90)
Glucose, Bld: 110 mg/dL — ABNORMAL HIGH (ref 70–99)
Sodium: 137 mEq/L (ref 135–145)

## 2011-05-07 LAB — CBC
MCH: 29.2 pg (ref 26.0–34.0)
MCHC: 32.7 g/dL (ref 30.0–36.0)
Platelets: 222 10*3/uL (ref 150–400)

## 2011-05-07 LAB — PROTIME-INR: INR: 1.86 — ABNORMAL HIGH (ref 0.00–1.49)

## 2011-05-08 DIAGNOSIS — Z930 Tracheostomy status: Secondary | ICD-10-CM

## 2011-05-08 DIAGNOSIS — J189 Pneumonia, unspecified organism: Secondary | ICD-10-CM

## 2011-05-08 DIAGNOSIS — J9620 Acute and chronic respiratory failure, unspecified whether with hypoxia or hypercapnia: Secondary | ICD-10-CM

## 2011-05-08 LAB — BASIC METABOLIC PANEL
BUN: 24 mg/dL — ABNORMAL HIGH (ref 6–23)
CO2: 28 mEq/L (ref 19–32)
Calcium: 10.8 mg/dL — ABNORMAL HIGH (ref 8.4–10.5)
Creatinine, Ser: 0.64 mg/dL (ref 0.50–1.10)

## 2011-05-08 LAB — MAGNESIUM: Magnesium: 1.8 mg/dL (ref 1.5–2.5)

## 2011-05-08 NOTE — Progress Notes (Signed)
Name: Rachel Brady MRN: [id number] DOB: 27-May-1945    LOS: 18  PCCM  PROGRESS  NOTE  History of Present Illness: 66 y/o F with hx of drug OD ? Suicide attempt. Treated at [facility] and required tracheostomy and full vent support. Tx to [facility] 04/20/11 and PCCM asked to consult 04/24/11 for ventilator mangement.  Lines / Drains: Trach>>  Cultures: none  Antibiotics: 1/11 diflucan>>off 1/10 primaxin>>off 1/10 zyvox>>off  Tests / Events: Trach collar tolerated  Vital Signs: Vital signs reviewed. Abnormal values will appear under impression plan section.    Physical Examination: General: chronically ill appearing female,  Neuro: awake and alert, appropriate, MAE CV: s1s2 rrr, no m/r/g PULM: resps even non labored on ATC with PMV, dimininshed bases, few scattered rhonchi.  GI: abd soft, +bs Extremities: warm and dry, no edema    Ventilator settings: ATC wean - tol ATC since 1/20, 28%  Labs and Imaging:     Lab 05/08/11 0600 05/07/11 0535 05/02/11 0529  NA 138 137 138  K 3.8 3.4* 3.7  CL 100 99 96  CO2 28 30 31   BUN 24* 19 38*  CREATININE 0.64 0.59 0.56  GLUCOSE 143* 110* 168*    Lab 05/07/11 0535 05/02/11 0529  HGB 10.8* 12.0  HCT 33.0* 36.1  WBC 6.1 9.0  PLT 222 196   No results found.   Assessment and Plan: VDRF multifactorial in setting ? Drug OD, PNA, OSA.   Assessment:  Now tol ATC since 1/20.  Passed swallow eval will start po diet 1/21. CXR slightly worse aeration R and cont opacification LLL.   PLAN -  -cont ATC as tol --on 28% -abx per primary -repeat CXR PRN, today improved  Hilar changes -downsize to #6 cuffed trach 1/25 Requires noctunal support for now,  Next week may consider to assess off vent at night. Consider decannulation and nocturnal nimvs CAF -per IM  LE PVD -per IM  Brett Canales Minor ACNP Adolph Pollack PCCM Pager [phone] till 3 pm If no answer page [phone] 05/08/2011, 9:12 AM  Patient seen and examined, agree with above note.  I  dictated the care and orders written for this patient under my direction.  Koren Bound, M.D. [phone]

## 2011-05-09 ENCOUNTER — Other Ambulatory Visit (HOSPITAL_COMMUNITY): Payer: Self-pay

## 2011-05-09 LAB — PROTIME-INR: INR: 2.54 — ABNORMAL HIGH (ref 0.00–1.49)

## 2011-05-10 LAB — PROTIME-INR
INR: 2.46 — ABNORMAL HIGH (ref 0.00–1.49)
Prothrombin Time: 27.1 s — ABNORMAL HIGH (ref 11.6–15.2)

## 2011-05-10 NOTE — Progress Notes (Signed)
Name: Rachel Brady MRN: [id number] DOB: April 26, 1945    LOS: 20  PCCM  PROGRESS  NOTE  History of Present Illness: 66 y/o F with hx of drug OD ? Suicide attempt. Treated at [facility] and required tracheostomy and full vent support. Tx to [facility] 04/20/11 and PCCM asked to consult 04/24/11 for ventilator mangement.  Lines / Drains: Trach>>  Cultures: none  Antibiotics: 1/11 diflucan>>off 1/10 primaxin>>off 1/10 zyvox>>off  Tests / Events: Trach collar tolerated  Vital Signs: Vital signs reviewed. Abnormal values will appear under impression plan section.    Physical Examination: General: chronically ill appearing female,  Neuro: awake and alert, appropriate, MAE CV: s1s2 rrr, no m/r/g PULM: resps even non labored on ATC with PMV, dimininshed bases, few scattered rhonchi.  GI: abd soft, +bs Extremities: warm and dry, no edema    Ventilator settings: ATC wean - tol ATC since 1/20, 28%  Labs and Imaging:     Lab 05/08/11 0600 05/07/11 0535  NA 138 137  K 3.8 3.4*  CL 100 99  CO2 28 30  BUN 24* 19  CREATININE 0.64 0.59  GLUCOSE 143* 110*    Lab 05/07/11 0535  HGB 10.8*  HCT 33.0*  WBC 6.1  PLT 222   Dg Chest Portable 1 View  05/09/2011  *RADIOLOGY REPORT*  Clinical Data: follow-up airspace disease.  Tracheostomy.  PORTABLE CHEST - 1 VIEW  Comparison: 06/13/2011  Findings: Tracheostomy and right central line are unchanged. Cardiomegaly.  Left basilar atelectasis or infiltrate.  No focal opacity on the right.  No definite effusions.  Old left rib fractures are noted.  IMPRESSION: Cardiomegaly.  Left lower lobe atelectasis or infiltrate.  Original Report Authenticated By: Cyndie Chime, M.D.     Assessment and Plan: VDRF multifactorial in setting ? Drug OD, PNA, OSA.   Assessment:  Now tol ATC since 1/20.  Passed swallow eval will start po diet 1/21. CXR slightly worse aeration R and cont opacification LLL.   PLAN -  -cont ATC as tol --on 28% -abx per  primary -repeat CXR PRN, today improved  Hilar changes -downsize to #6 cuffed trach 1/25 Requires noctunal support for now,  Next week may consider to assess off vent at night. Consider decannulation and nocturnal nimvs CAF -per IM  LE PVD -per IM  Place on CPAP of 5 overnight and if tolerated by Friday will keep off positive pressure 24/7.  Brett Canales Minor ACNP Adolph Pollack PCCM Pager [phone] till 3 pm If no answer page [phone] 05/10/2011, 9:07 AM  Patient seen and examined, agree with above note.  I dictated the care and orders written for this patient under my direction.  Koren Bound, M.D. [phone]

## 2011-05-11 DIAGNOSIS — J9620 Acute and chronic respiratory failure, unspecified whether with hypoxia or hypercapnia: Secondary | ICD-10-CM

## 2011-05-11 DIAGNOSIS — J189 Pneumonia, unspecified organism: Secondary | ICD-10-CM

## 2011-05-11 DIAGNOSIS — Z930 Tracheostomy status: Secondary | ICD-10-CM

## 2011-05-11 NOTE — Progress Notes (Signed)
Name: Rachel Brady MRN: [id number] DOB: 10-08-1945    LOS: 21  PCCM  PROGRESS  NOTE  History of Present Illness: 66 y/o F with hx of drug OD ? Suicide attempt. Treated at [facility] and required tracheostomy and full vent support. Tx to [facility] 04/20/11 and PCCM asked to consult 04/24/11 for ventilator mangement.  Lines / Drains: Trach>>  Cultures: none  Antibiotics: 1/11 diflucan>>off 1/10 primaxin>>off 1/10 zyvox>>off  Tests / Events: Tol PS 5 overnight. Wears CPAP at baseline.   Vital Signs: Vital signs reviewed and stable   Physical Examination: General: chronically ill appearing female,  Neuro: awake and alert, appropriate, MAE CV: s1s2 rrr, no m/r/g PULM: resps even non labored on ATC with PMV, dimininshed bases, few scattered rhonchi.  GI: abd soft, +bs Extremities: warm and dry, no edema    Ventilator settings: ATC wean - tol ATC since 1/20, 28% with qhs PS  Labs and Imaging:     Lab 05/08/11 0600 05/07/11 0535  NA 138 137  K 3.8 3.4*  CL 100 99  CO2 28 30  BUN 24* 19  CREATININE 0.64 0.59  GLUCOSE 143* 110*    Lab 05/07/11 0535  HGB 10.8*  HCT 33.0*  WBC 6.1  PLT 222   No results found.   Assessment and Plan: VDRF multifactorial in setting ? Drug OD, PNA, OSA.   Assessment:  Now tol ATC since 1/20.  Passed swallow eval will start po diet 1/21. Tol PS 5 overnight 1/30.   PLAN -  -cont ATC as tol --on 28% -abx per primary -downsize to #6 cuffed trach 1/25 -will try cap trach daytime, ATC overnight  -likely not candidate for decannulation given mult OD/suicide attempts/VDRF  CAF -per IM  LE PVD -per IM    WHITEHEART,KATHRYN, NP 05/11/2011  9:37 AM Pager: [phone]  *Care during the described time interval was provided by me and/or other providers on the critical care team. I have reviewed this patient's available data, including medical history, events of note, physical examination and test results as part of my  evaluation.  Patient seen and examined, agree with above note.  I dictated the care and orders written for this patient under my direction.  Koren Bound, M.D. [phone]

## 2011-05-12 ENCOUNTER — Other Ambulatory Visit (HOSPITAL_COMMUNITY): Payer: Self-pay

## 2011-05-12 ENCOUNTER — Ambulatory Visit: Payer: Self-pay | Admitting: Internal Medicine

## 2011-05-12 LAB — PROTIME-INR: INR: 1.91 — ABNORMAL HIGH (ref 0.00–1.49)

## 2011-05-14 LAB — PROTIME-INR: INR: 1.9 — ABNORMAL HIGH (ref 0.00–1.49)

## 2011-05-15 DIAGNOSIS — J9620 Acute and chronic respiratory failure, unspecified whether with hypoxia or hypercapnia: Secondary | ICD-10-CM

## 2011-05-15 DIAGNOSIS — J189 Pneumonia, unspecified organism: Secondary | ICD-10-CM

## 2011-05-15 DIAGNOSIS — Z930 Tracheostomy status: Secondary | ICD-10-CM

## 2011-05-15 LAB — BASIC METABOLIC PANEL
CO2: 30 mEq/L (ref 19–32)
Chloride: 100 mEq/L (ref 96–112)
Creatinine, Ser: 0.53 mg/dL (ref 0.50–1.10)
Sodium: 138 mEq/L (ref 135–145)

## 2011-05-15 LAB — CBC
MCV: 89.4 fL (ref 78.0–100.0)
Platelets: 301 10*3/uL (ref 150–400)
RBC: 3.97 MIL/uL (ref 3.87–5.11)
WBC: 6.7 10*3/uL (ref 4.0–10.5)

## 2011-05-15 NOTE — Progress Notes (Signed)
Name: Rachel Brady MRN: [id number] DOB: [birth year]    LOS: 25  PCCM  PROGRESS  NOTE  History of Present Illness: 66 y/o F with hx of drug OD ? Suicide attempt. Treated at [facility] and required tracheostomy and full vent support. Tx to [facility] 04/20/11 and PCCM asked to consult 04/24/11 for ventilator mangement.  Lines / Drains: Trach>>  Cultures: none  Antibiotics: 1/11 diflucan>>off 1/10 primaxin>>off 1/10 zyvox>>off  Tests / Events: On RA, trach capped since 2/3  Vital Signs: Vital signs reviewed and stable   Physical Examination: General: chronically ill appearing female,  Neuro: awake and alert, appropriate, MAE CV: s1s2 rrr, no m/r/g PULM: resps even non labored on RA with cap, lungs distant bilaterally but clear GI: abd soft, +bs Extremities: warm and dry, no edema      Labs and Imaging:     Lab 05/15/11 0500  NA 138  K 3.6  CL 100  CO2 30  BUN 17  CREATININE 0.53  GLUCOSE 108*    Lab 05/15/11 0500  HGB 11.4*  HCT 35.5*  WBC 6.7  PLT 301   No results found.   Assessment and Plan: VDRF multifactorial in setting ? Drug OD, PNA, OSA.   Assessment:  Now tol ATC since 1/20.  Passed swallow eval will start po diet 1/21. Tol PS 5 overnight 1/30.   PLAN -  -cont ATC as tol --on 28% -abx per primary -downsize to #6 cuffed trach 1/25>>>? De-cannulate 2/4.  Pending Pulmonary MD review. -?? candidate for decannulation given mult OD/suicide attempts/VDRF  CAF -per IM  LE PVD -per IM    Canary Brim, NP-C [place] [facility] Pgr: [phone]  Pt seen and examined and database reviewed. I agree with above findings, assessment and plan. Discussed with Dr Christella Hartigan. She appears ready for decannulation. Would proceed with such today. PCCM will s/o. Please call if we can be of further assistance  Billy Fischer, MD;  PCCM service; Mobile [phone]

## 2011-05-16 LAB — PROTIME-INR
INR: 1.88 — ABNORMAL HIGH (ref 0.00–1.49)
Prothrombin Time: 21.9 seconds — ABNORMAL HIGH (ref 11.6–15.2)

## 2011-05-17 ENCOUNTER — Other Ambulatory Visit (HOSPITAL_COMMUNITY): Payer: Self-pay

## 2011-05-17 DIAGNOSIS — Z930 Tracheostomy status: Secondary | ICD-10-CM

## 2011-05-17 DIAGNOSIS — R0602 Shortness of breath: Secondary | ICD-10-CM

## 2011-05-17 DIAGNOSIS — R061 Stridor: Secondary | ICD-10-CM

## 2011-05-17 LAB — PROTIME-INR: INR: 1.89 — ABNORMAL HIGH (ref 0.00–1.49)

## 2011-05-17 LAB — BLOOD GAS, ARTERIAL
Acid-Base Excess: 4.4 mmol/L — ABNORMAL HIGH (ref 0.0–2.0)
O2 Content: 6 L/min
O2 Saturation: 99.7 %
Patient temperature: 98.6

## 2011-05-17 NOTE — Progress Notes (Signed)
Name: Rachel Brady MRN: [id number] DOB: 1946/03/14    LOS: 27  PCCM  PROGRESS  NOTE  History of Present Illness: 66 y/o F with hx of drug OD ? Suicide attempt. Treated at [facility] and required tracheostomy and full vent support. Tx to [facility] 04/20/11 and PCCM asked to consult 04/24/11 for ventilator mangement.  Lines / Drains: Trach>>  Cultures: none  Antibiotics: 1/11 diflucan>>off 1/10 primaxin>>off 1/10 zyvox>>off  Tests / Events: On RA, trach capped since 2/3>>decan 2/4 2/5- stridor , resp distress>>>  Vital Signs: Vital signs reviewed rr 21   Physical Examination: General: chronically ill appearing female, mild distress, cough wnl, speaking full sentences Neuro: awake and alert, appropriate, MAE CV: s1s2 rrr, no m/r/g PULM: resps even mild labored on rac epi neb, lungs distant bilaterally but clear GI: abd soft, +bs Extremities: warm and dry, no edema    Labs and Imaging:     Lab 05/15/11 0500  NA 138  K 3.6  CL 100  CO2 30  BUN 17  CREATININE 0.53  GLUCOSE 108*    Lab 05/15/11 0500  HGB 11.4*  HCT 35.5*  WBC 6.7  PLT 301   No results found.  Assessment and Plan: VDRF multifactorial in setting ? Drug OD, PNA, OSA.   Assessment:  Now tol ATC since 1/20.  Passed swallow eval will start po diet 1/21. Tol PS 5 overnight 1/30.  Decanulation 2/4 done now Stridor, resp distress 2/4, mild at best -she speaks full sentences abg reviewed, ventilation and O2 needs good Etiology? Secretions, stenosis, granulation? i see no need now to intubate, if she requires intubation then I will perform bedside re do trach rac epi x 2 given, no further needed NTS Will call ENT to consider scope and assess for stenosis, granulation etc  Re add home nocturnal Bipap for tonight There is no role NIMV as of now for resp status, but may be required in day  Change solumedrol to decadron  CAF -per IM  LE PVD -per IM  Ccm 30 min   Mcarthur Rossetti. Tyson Alias, MD, FACP Pgr:  [phone] [facility]

## 2011-05-18 ENCOUNTER — Other Ambulatory Visit (HOSPITAL_COMMUNITY): Payer: Self-pay

## 2011-05-18 LAB — BASIC METABOLIC PANEL
BUN: 21 mg/dL (ref 6–23)
Calcium: 11.3 mg/dL — ABNORMAL HIGH (ref 8.4–10.5)
GFR calc non Af Amer: >90 mL/min (ref >90)
Glucose, Bld: 214 mg/dL — ABNORMAL HIGH (ref 70–99)

## 2011-05-18 LAB — BLOOD GAS, ARTERIAL
Bicarbonate: 29 mEq/L — ABNORMAL HIGH (ref 20.0–24.0)
TCO2: 30.5 mmol/L (ref 0–100)
pCO2 arterial: 47.1 mmHg — ABNORMAL HIGH (ref 35.0–45.0)
pH, Arterial: 7.406 — ABNORMAL HIGH (ref 7.350–7.400)

## 2011-05-18 LAB — PROTIME-INR: Prothrombin Time: 26.7 seconds — ABNORMAL HIGH (ref 11.6–15.2)

## 2011-05-19 LAB — PROTIME-INR
INR: 3.22 — ABNORMAL HIGH (ref 0.00–1.49)
Prothrombin Time: 33.4 seconds — ABNORMAL HIGH (ref 11.6–15.2)

## 2011-05-20 ENCOUNTER — Other Ambulatory Visit: Payer: Self-pay | Admitting: Family Medicine

## 2011-05-20 LAB — PROTIME-INR
INR: 2.5
Prothrombin Time: 27.5 secs — ABNORMAL HIGH (ref 11.5–14.7)

## 2012-09-02 ENCOUNTER — Emergency Department: Payer: Self-pay | Admitting: Internal Medicine

## 2013-06-12 IMAGING — CR RIGHT FOOT - 2 VIEW
1 series · 2 of 2 positions shown · non-contrast
Comparison: none

REASON FOR EXAM: pain
COMMENTS:

PROCEDURE:     DXR - DXR FOOT RIGHT AP AND LATERAL  - March 27, 2011  [DATE]
RESULT:
The foot and ankle are wrapped with gauze. No fracture or dislocation is
seen. No definite lytic areas indicative of osteomyelitis are seen.

[Series 1: ap · 0.17mm/px · 2 of 2 slices shown]
[im 1/2]
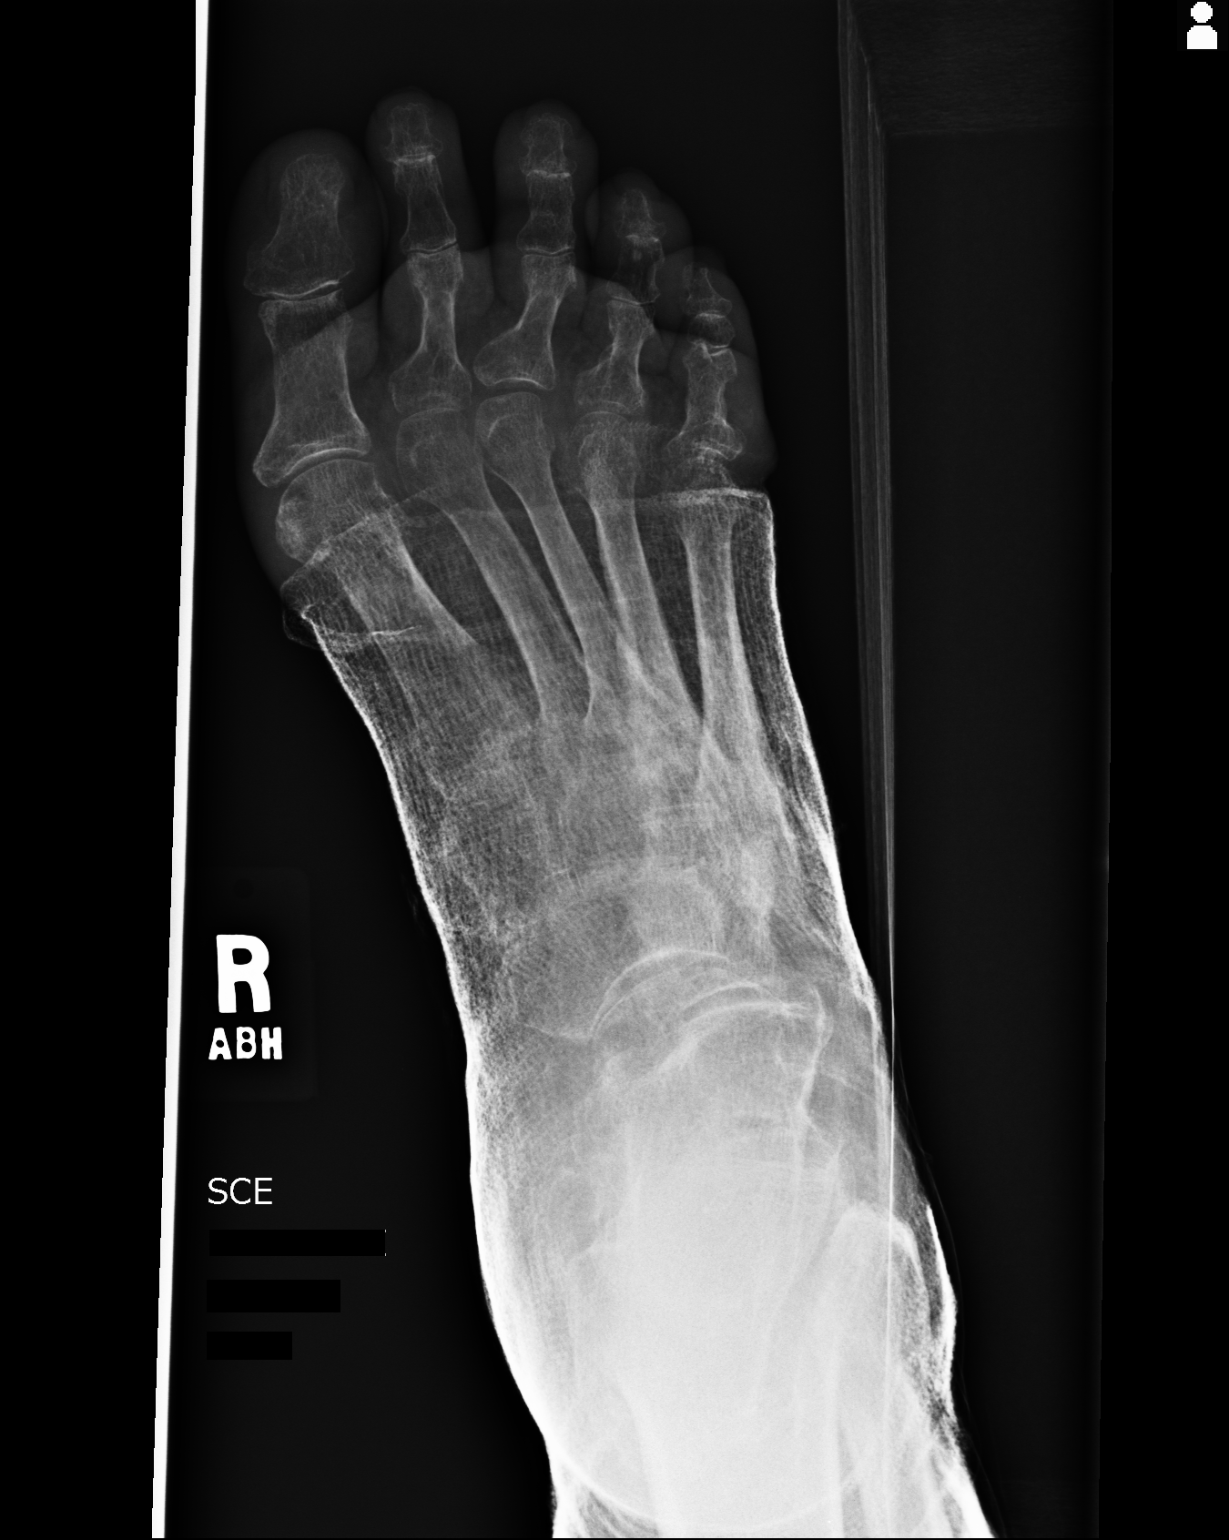
[im 2/2]
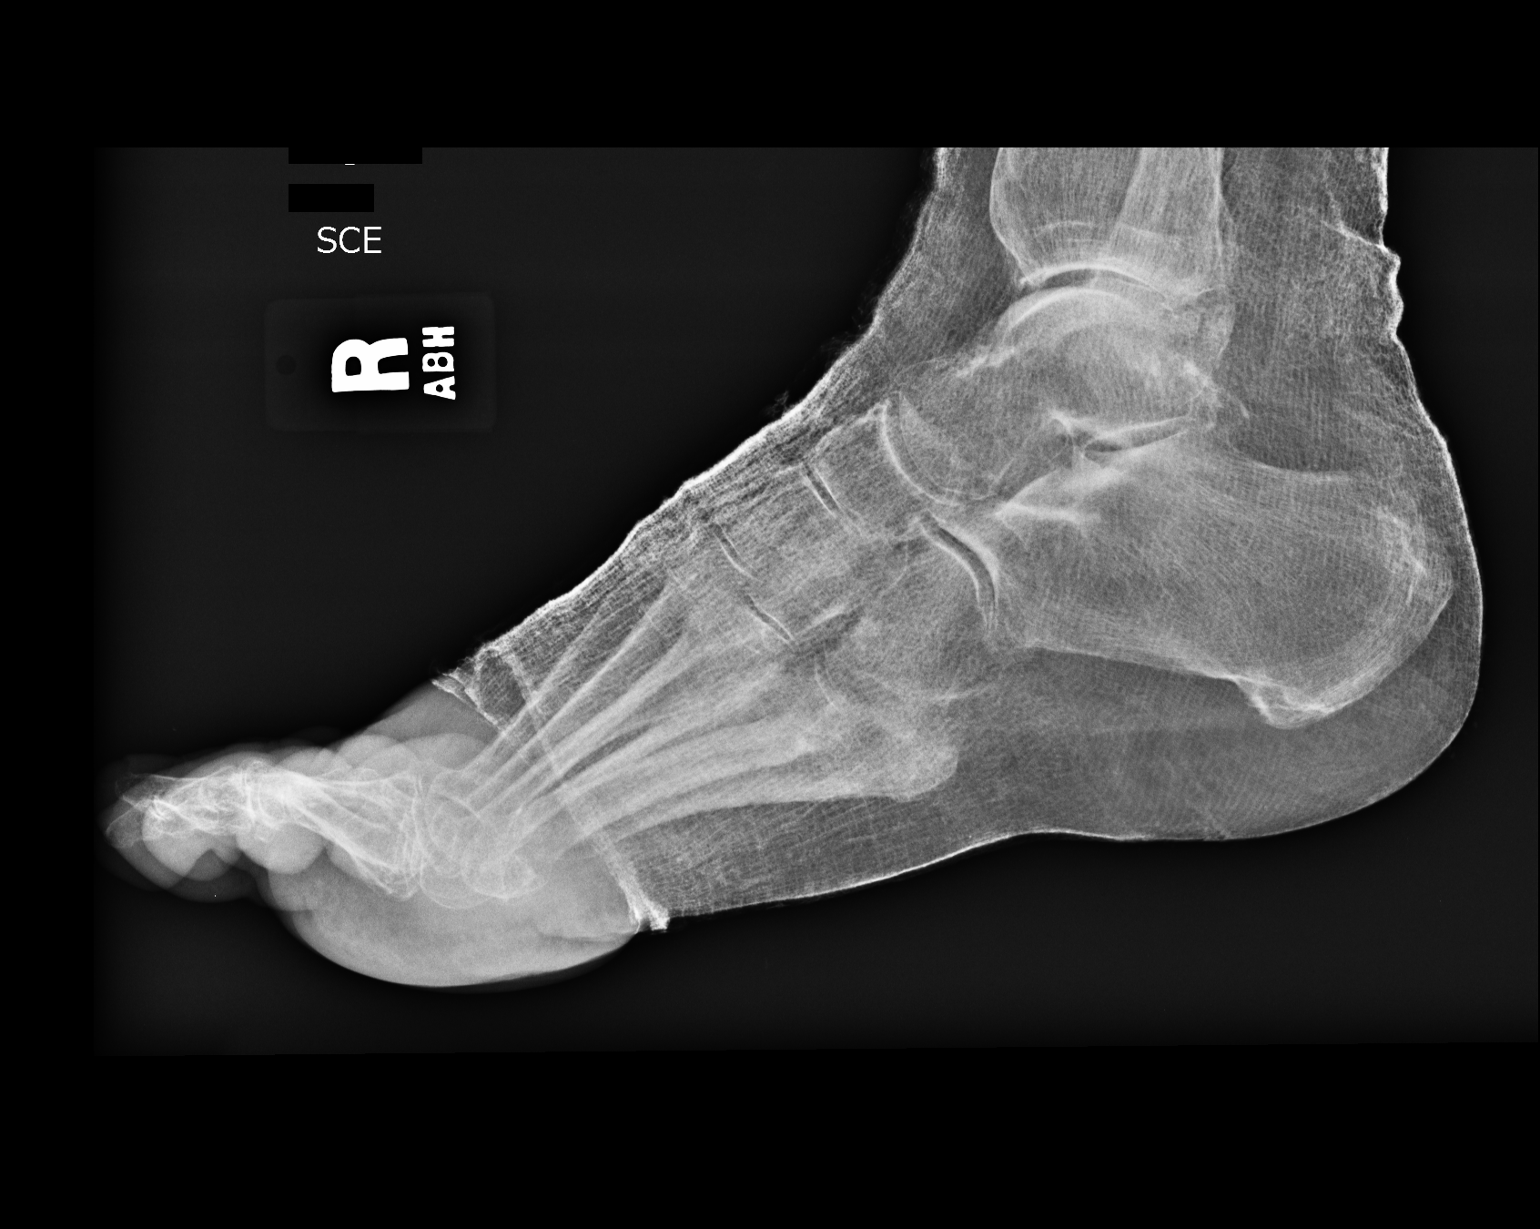

[2 of 2 positions shown; findings below may reference images not displayed]

IMPRESSION: No acute bony abnormalities are identified.

## 2013-06-12 IMAGING — CT CT HEAD WITHOUT CONTRAST
2 series · 15 of 30 positions shown, 19 images · non-contrast
Comparison: none

REASON FOR EXAM: unresponsive
COMMENTS:

PROCEDURE:     CT  - CT HEAD WITHOUT CONTRAST  - March 27, 2011  [DATE]
RESULT:     Comparison:  10/27/2008
TECHNIQUE: Multiple axial images from the foramen magnum to the vertex were
obtained without IV contrast.

[Series 2: without · axial · non-contrast · 0.39mm/px · z∈[+198,+334]mm · 13 of 33 slices shown, 17 images]
[im 3/33  brain]
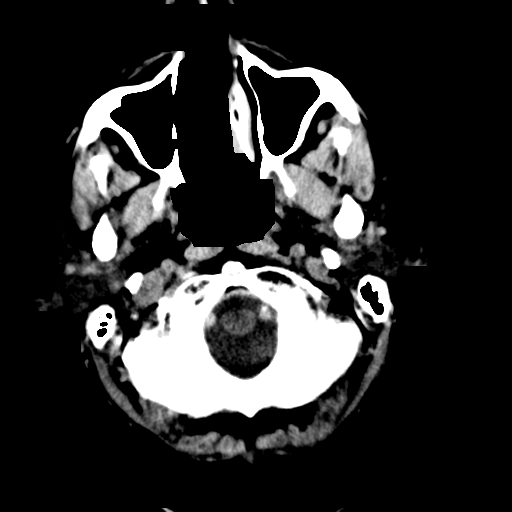
[im 3/33  bone]
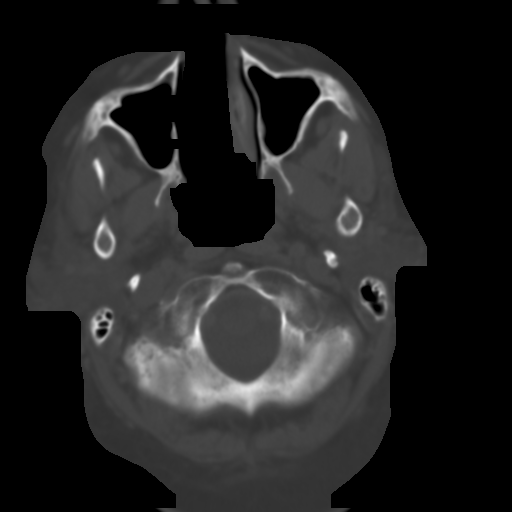
[im 5/33  brain]
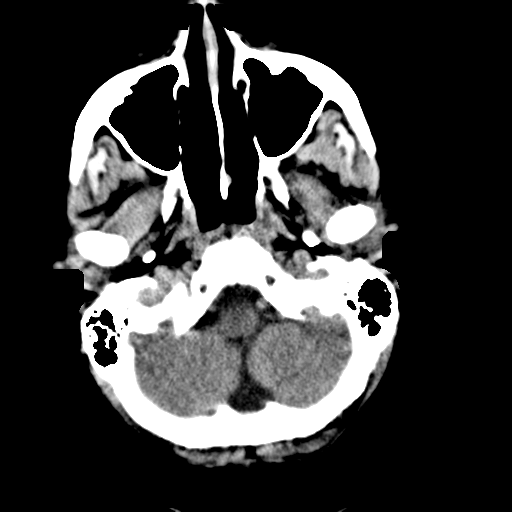
[im 7/33  brain]
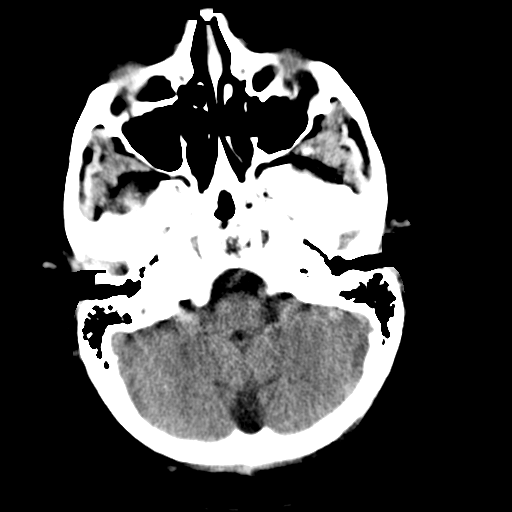
[im 10/33  brain]
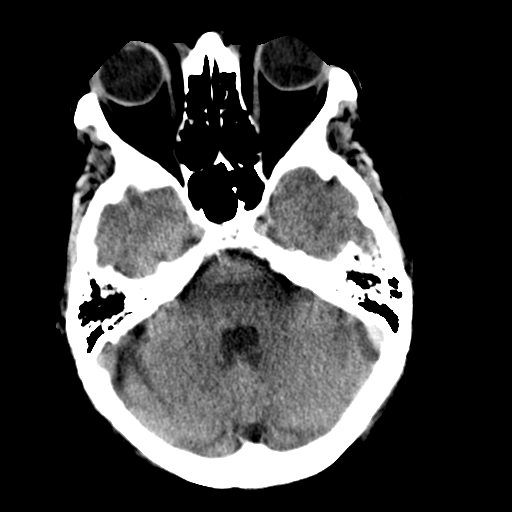
[im 12/33  brain]
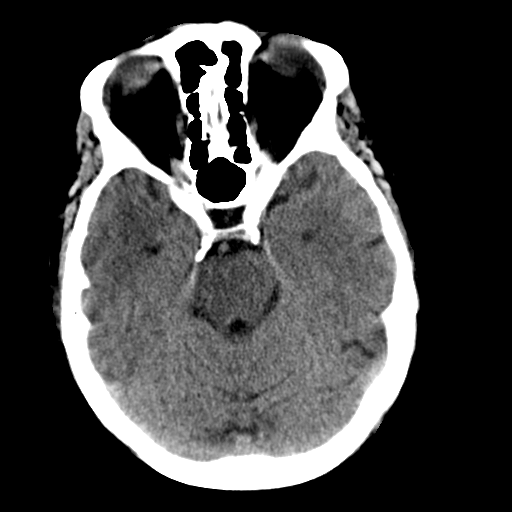
[im 12/33  bone]
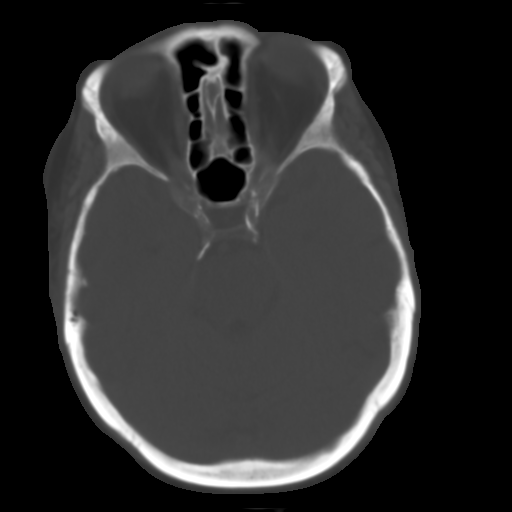
[im 14/33  brain]
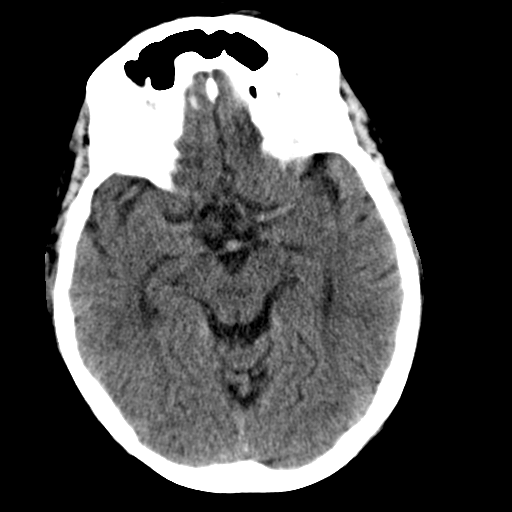
[im 17/33  brain]
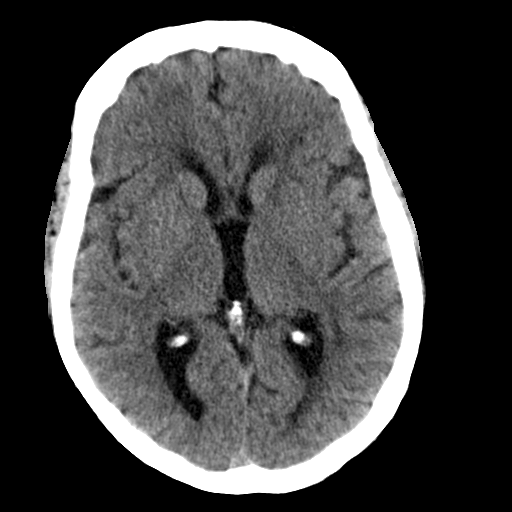
[im 19/33  brain]
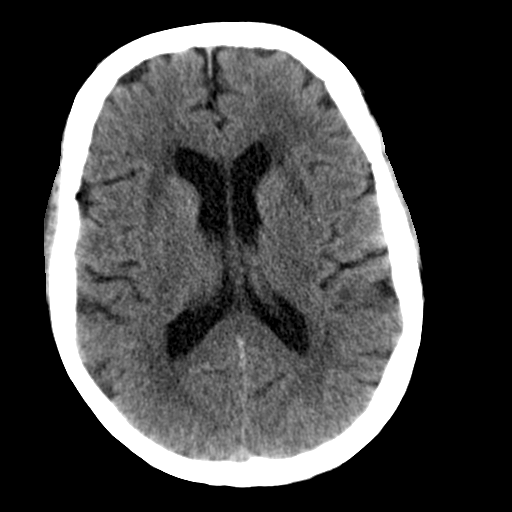
[im 21/33  brain]
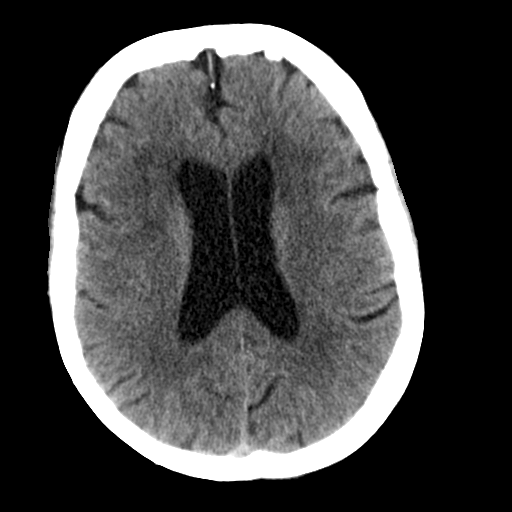
[im 21/33  bone]
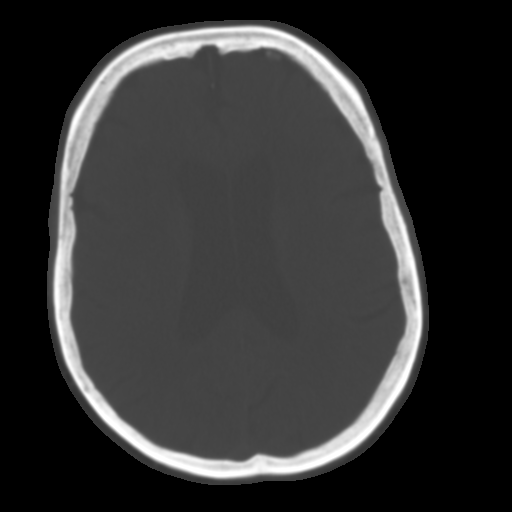
[im 23/33  brain]
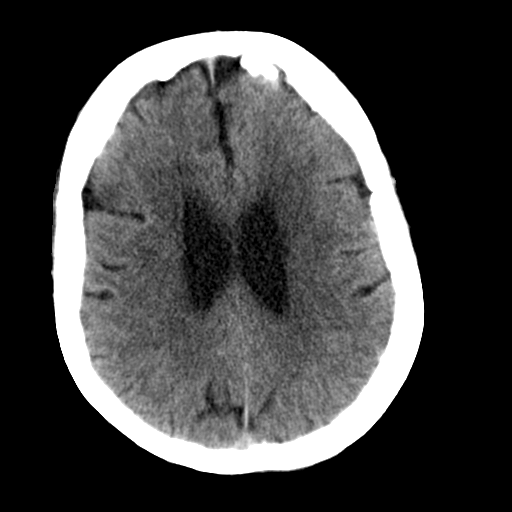
[im 26/33  brain]
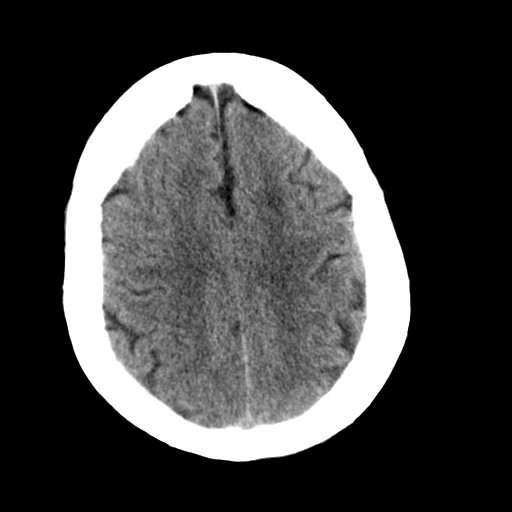
[im 28/33  brain]
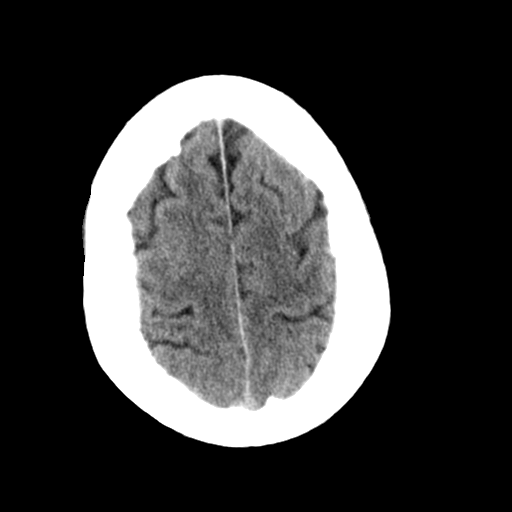
[im 30/33  brain]
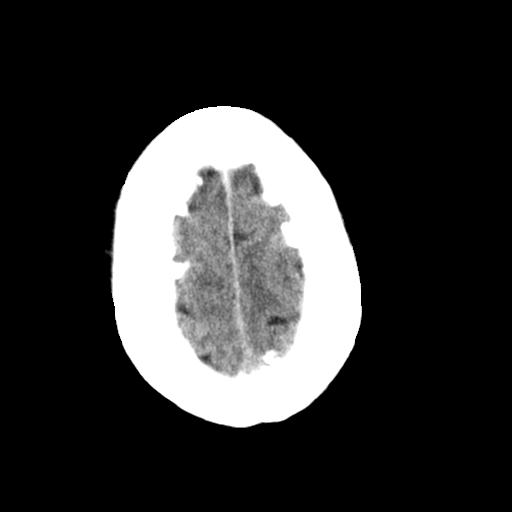
[im 30/33  bone]
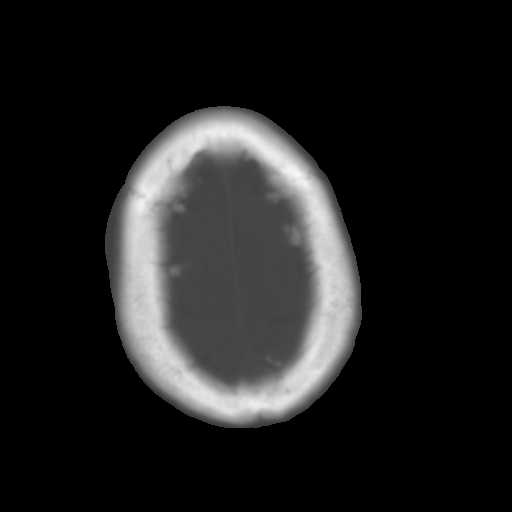

[Series 3: bone · axial · 0.39mm/px · z∈[+198,+218]mm · 2 of 33 slices shown]
[im 3/33  bone]
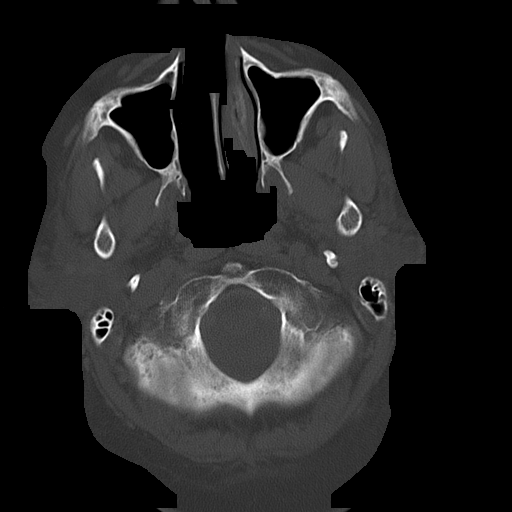
[im 7/33  bone]
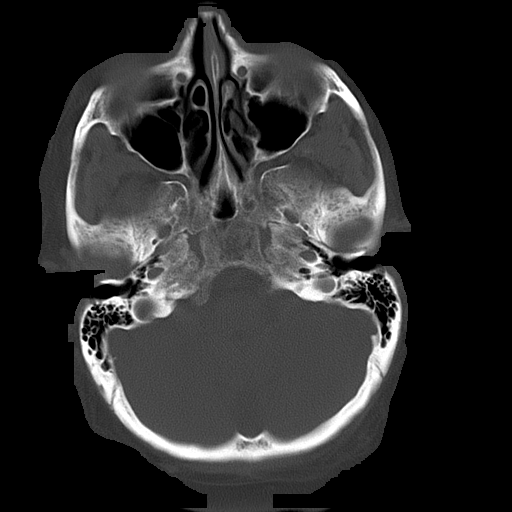

[15 of 30 positions shown; findings below may reference images not displayed]

FINDINGS: There is no evidence of mass effect, midline shift, or extra-axial fluid
collections.  There is no evidence of a space-occupying lesion or
intracranial hemorrhage. There is no evidence of a cortical-based area of
acute infarction. There is generalized cerebral atrophy. There is
periventricular white matter low attenuation likely secondary to
microangiopathy.

The ventricles and sulci are appropriate for the patient's age. The basal
cisterns are patent.

Visualized portions of the orbits are unremarkable. The visualized portions
of the paranasal sinuses and mastoid air cells are unremarkable.
Cerebrovascular atherosclerotic calcifications are noted.

The osseous structures are unremarkable.
IMPRESSION: No acute intracranial process.

## 2013-06-13 IMAGING — CR DG CHEST 1V PORT
1 series · 1 of 1 positions shown · non-contrast
Comparison: none

REASON FOR EXAM: AMS
COMMENTS:

[portable]
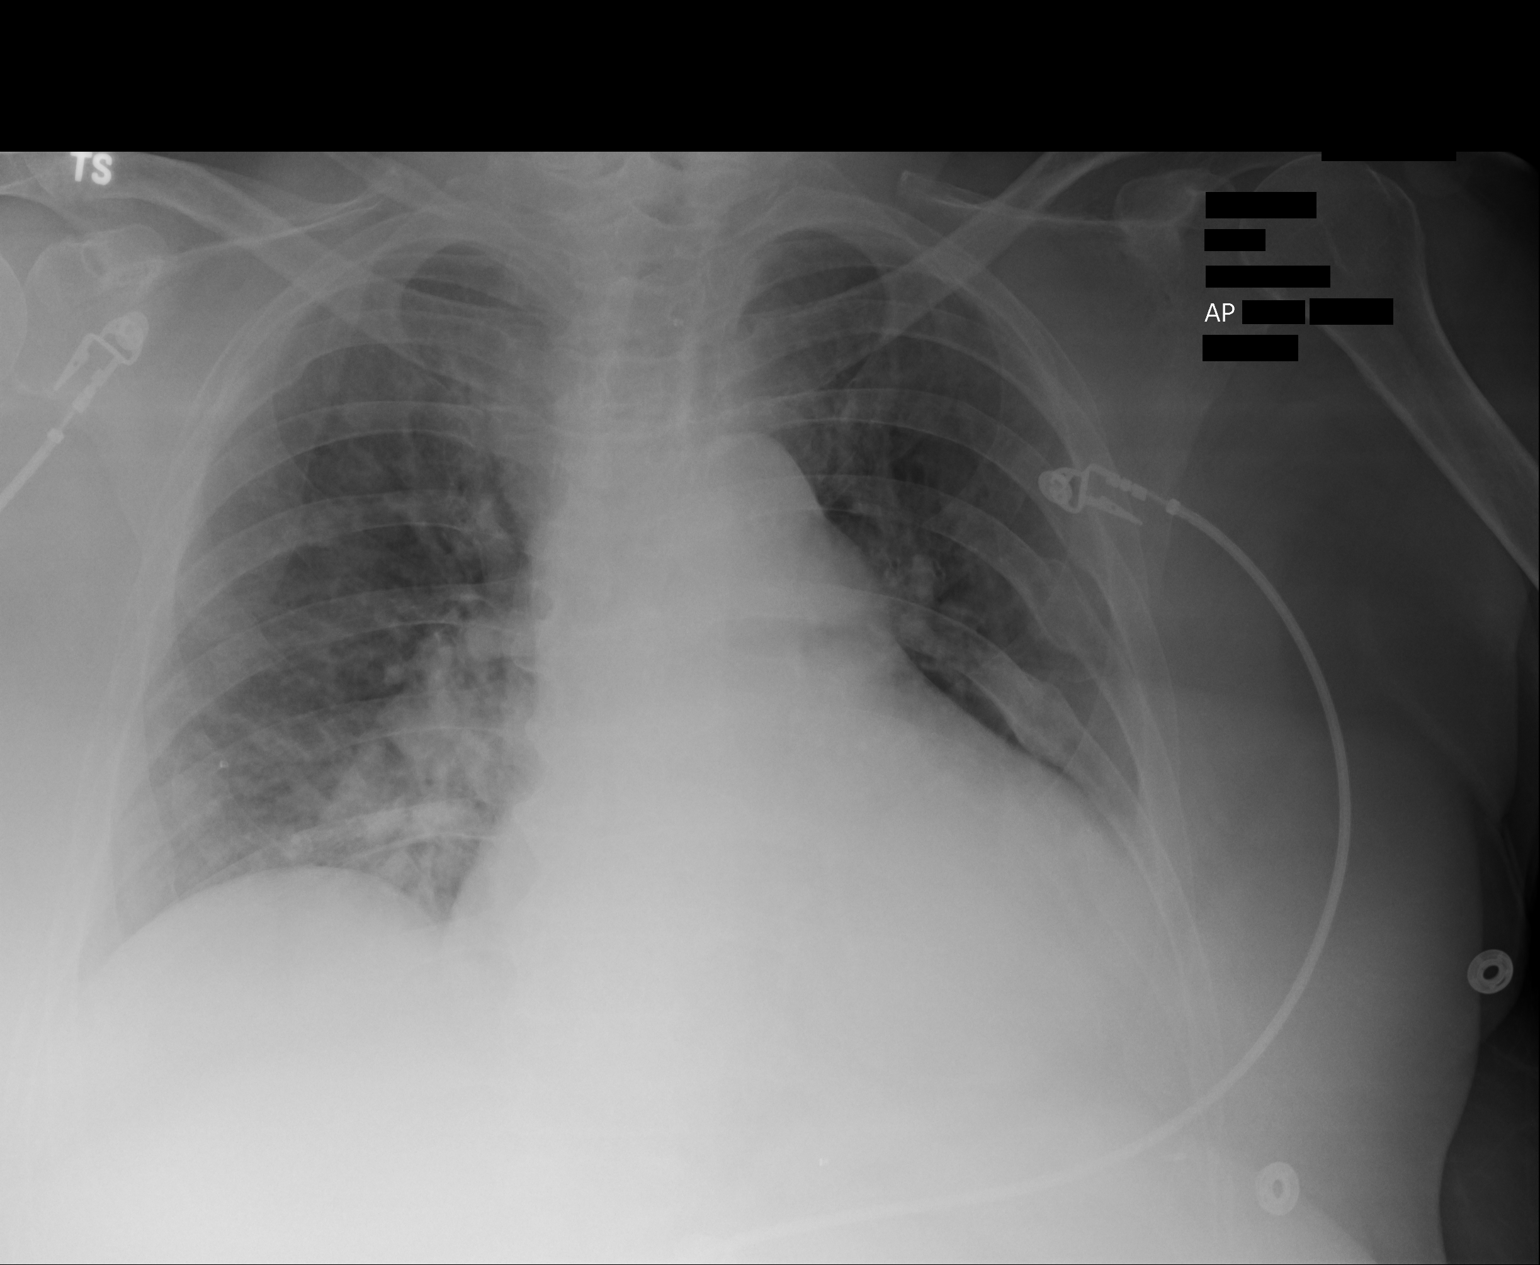

[1 of 1 positions shown; findings below may reference images not displayed]

PROCEDURE:     DXR - DXR PORTABLE CHEST SINGLE VIEW  - March 28, 2011  [DATE]

RESULT:

Comparison is made to the prior exam of 10/14/2009.

The inspiratory level of the current exam is less than optimal. There is
increased density at the left base compatible with atelectasis or pneumonia.
There is thickening of the right basilar markings compatible with the lack
of deep inspiration. The heart is mildly enlarged but stable in size as
compared to the prior exam. There are multiple, old healed rib fractures on
the left.
IMPRESSION: 1.  Suboptimal inspiration.
2.  There is increased density at the left base compatible with atelectasis
although underlying pneumonia cannot be excluded on the basis of this exam.
3.  Stable cardiomegaly.

## 2013-06-17 IMAGING — CR DG CHEST 1V PORT
1 series · 1 of 1 positions shown · non-contrast
Comparison: none

REASON FOR EXAM: central line
COMMENTS:

[portable]
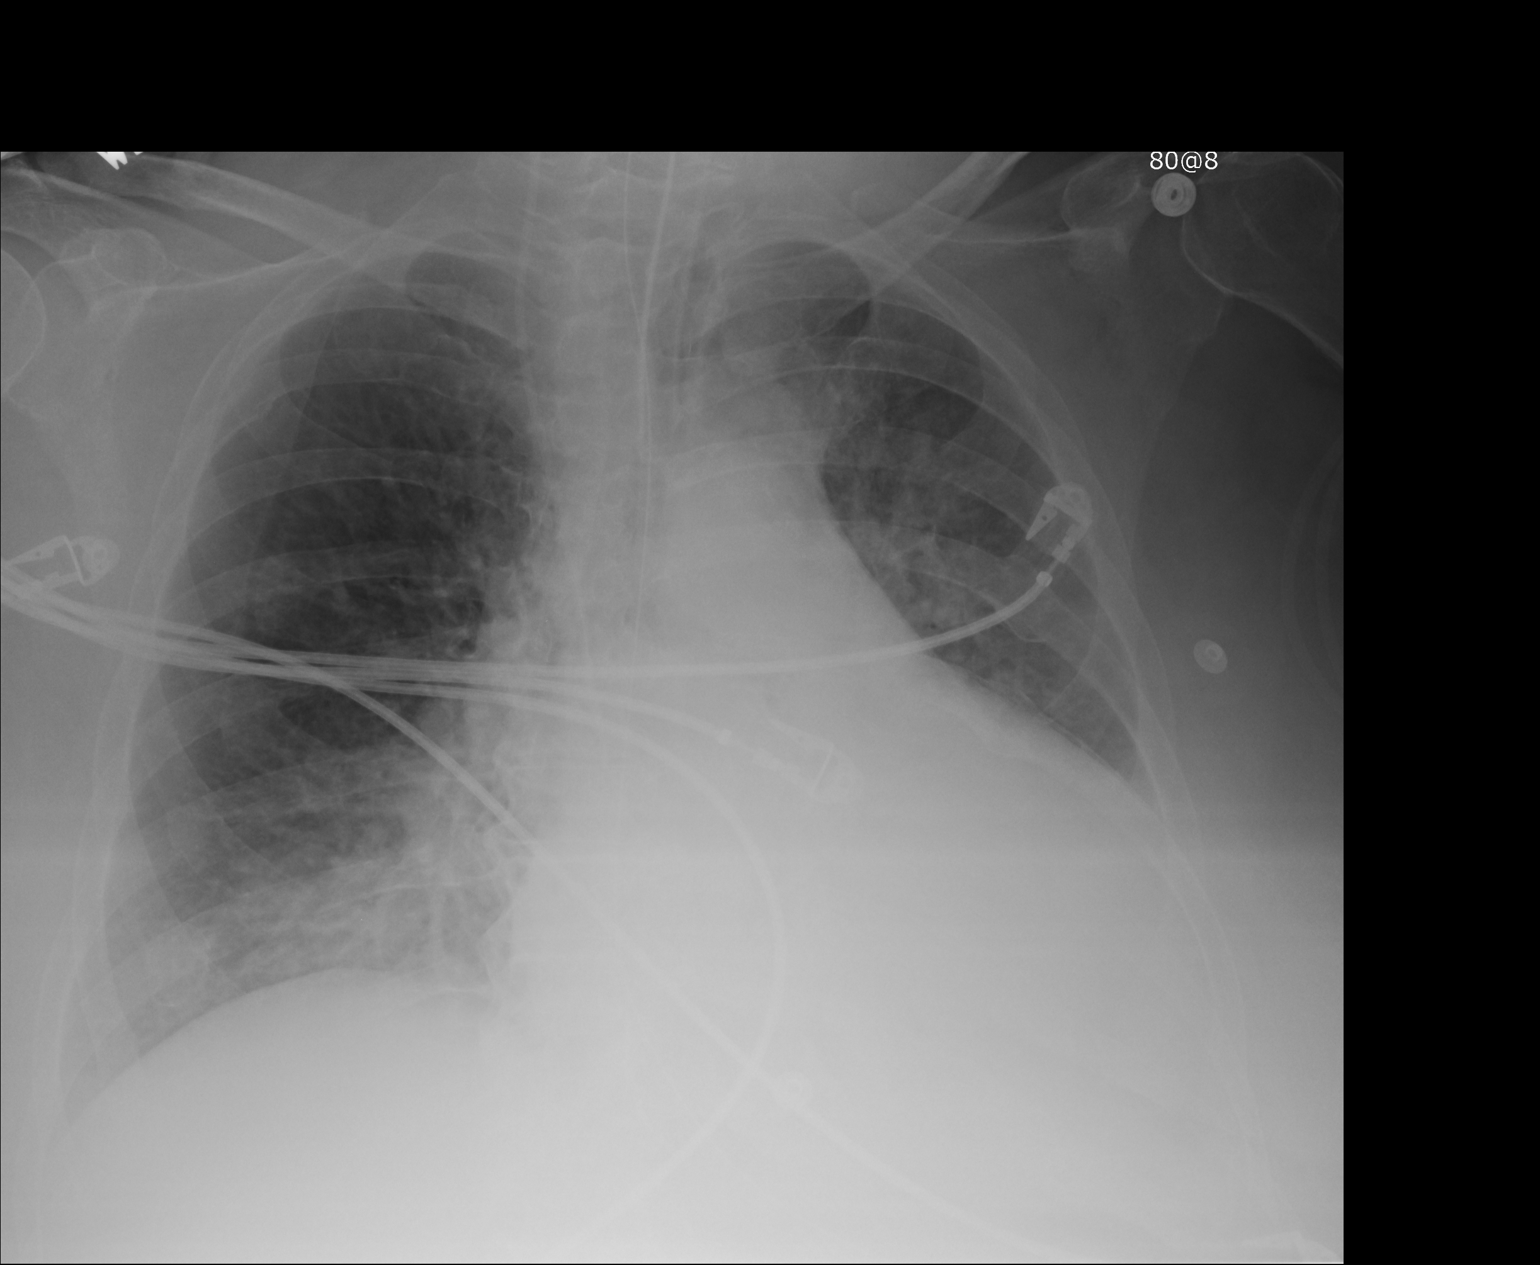

[1 of 1 positions shown; findings below may reference images not displayed]

PROCEDURE:     DXR - DXR PORTABLE CHEST SINGLE VIEW  - April 01, 2011  [DATE]

RESULT:     Comparison made to study 01 April, 2011 at [DATE] a.m.

The patient's undergone interval placement of a right internal jugular
venous catheter. The tip of the catheter lies in the region of the distal
SVC. I see no postprocedure complication. The pulmonary vascularity and
perihilar lung markings remain prominent. The retrocardiac region is more
dense. The cardiac silhouette is mildly enlarged. The endotracheal tube tip
lies approximately 1 cm below the inferior margin of the clavicular heads.
IMPRESSION: I see no postprocedure complication following placement of
the right internal jugular venous catheter.

## 2013-06-17 IMAGING — CR DG CHEST 1V PORT
1 series · 1 of 1 positions shown · non-contrast
Comparison: none

REASON FOR EXAM: f/u resp failure
COMMENTS:

[portable]
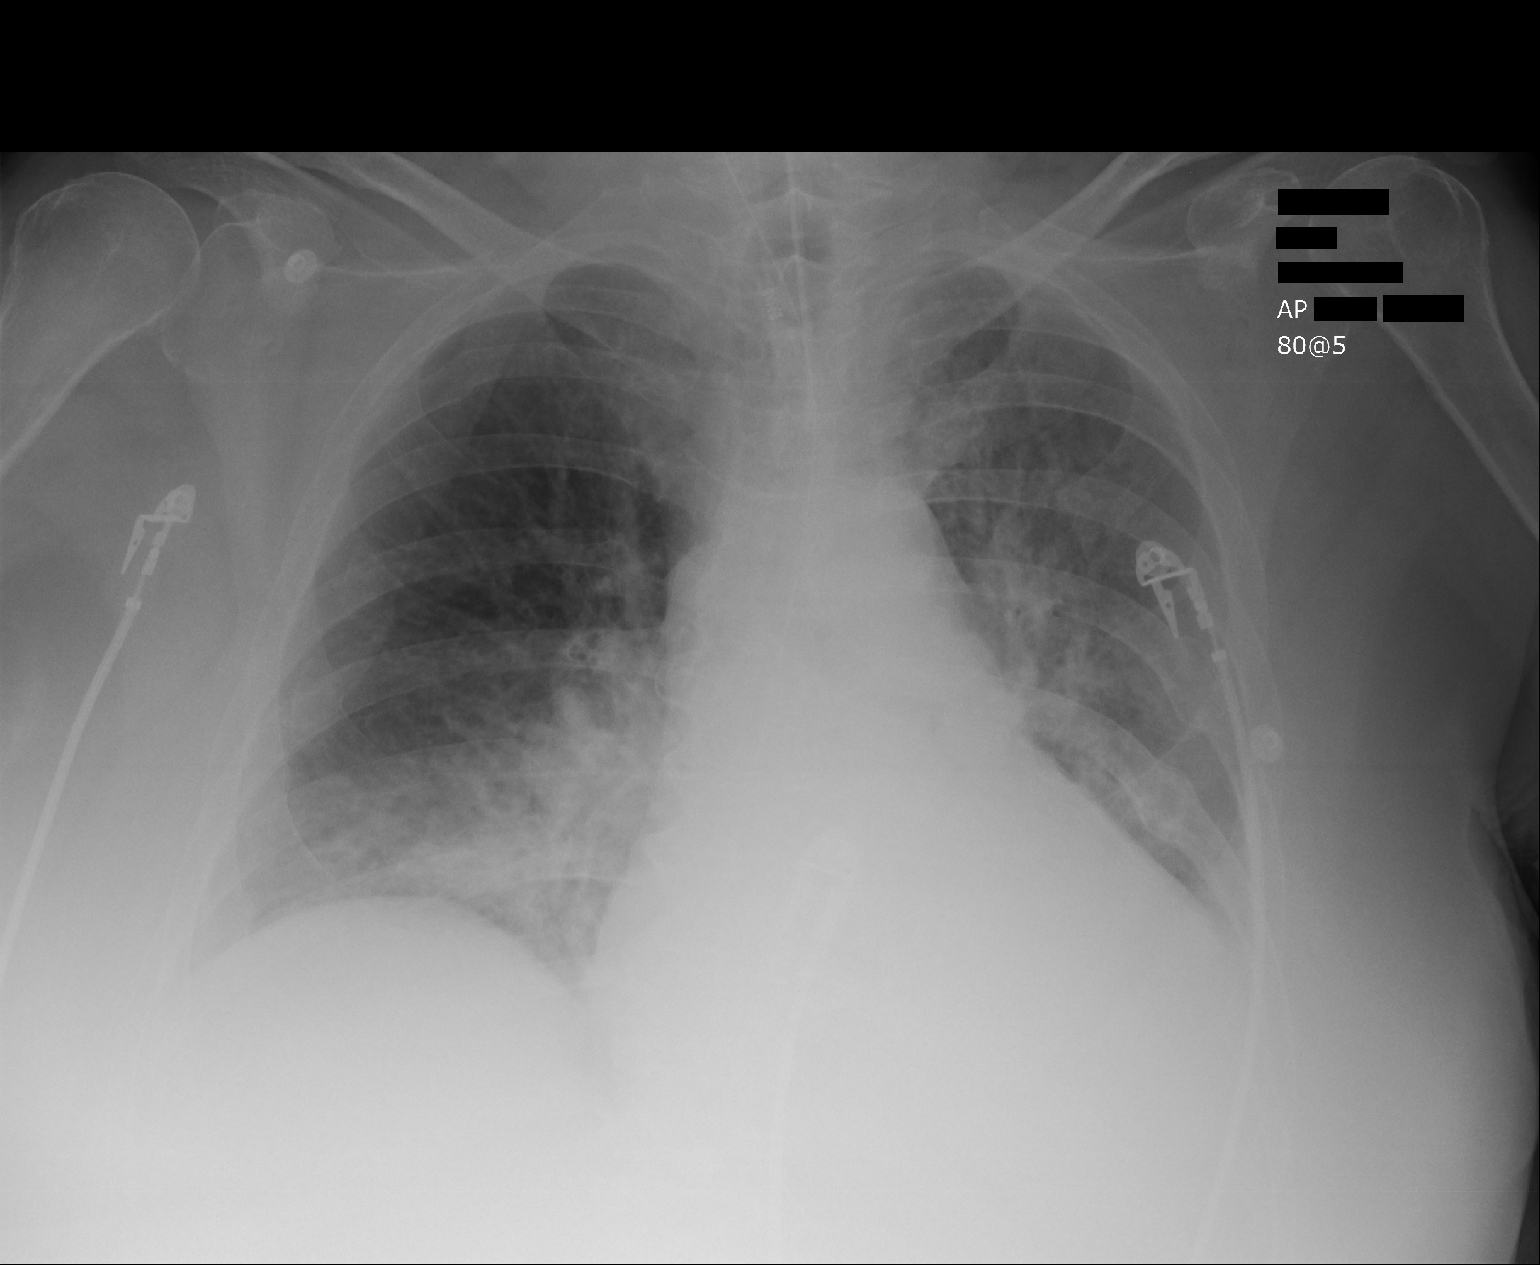

[1 of 1 positions shown; findings below may reference images not displayed]

PROCEDURE:     DXR - DXR PORTABLE CHEST SINGLE VIEW  - April 01, 2011  [DATE]

RESULT:     Comparison is made to the study March 31, 2011.

The endotracheal tube tip lies at the level of the inferior margin of the
clavicular heads. The nasogastric tube tip projects off the film. There are
fluffy perihilar densities in both lungs. There has been overall improvement
however in the appearance of the left lung since yesterday's study. The
cardiac silhouette remains enlarged in the retrocardiac region on the left
remains dense. Slightly increased density at the right lung base is seen.
IMPRESSION: 1. There is an improved appearance of the left lung since yesterday's study
but significant abnormality consistent with atelectasis and consolidation
remains.
2. On the right slightly increased density is noted inferiorly which may
reflect some worsening of atelectasis.

## 2013-07-24 ENCOUNTER — Encounter: Payer: Self-pay | Admitting: Surgery

## 2014-04-22 ENCOUNTER — Encounter: Payer: Self-pay | Admitting: Surgery

## 2014-04-25 LAB — WOUND AEROBIC CULTURE

## 2014-04-30 ENCOUNTER — Encounter: Payer: Self-pay | Admitting: Surgery

## 2014-05-11 ENCOUNTER — Encounter: Payer: Self-pay | Admitting: Surgery

## 2014-05-12 ENCOUNTER — Ambulatory Visit: Payer: Self-pay | Admitting: Internal Medicine

## 2014-06-09 ENCOUNTER — Encounter: Payer: Self-pay | Admitting: Surgery

## 2014-07-14 ENCOUNTER — Emergency Department
Admit: 2014-07-14 | Disposition: A | Discharge status: Other Acute Inpatient Hospital | Payer: Self-pay | Admitting: Emergency Medicine

## 2014-07-14 LAB — URINALYSIS, COMPLETE
BILIRUBIN, UR: NEGATIVE
Glucose,UR: NEGATIVE mg/dL (ref 0–75)
Hyaline Cast: 19
KETONE: NEGATIVE
LEUKOCYTE ESTERASE: NEGATIVE
Nitrite: NEGATIVE
Ph: 6 (ref 4.5–8.0)
Protein: 100
Specific Gravity: 1.013 (ref 1.003–1.030)
Squamous Epithelial: 3

## 2014-07-14 LAB — TROPONIN I: TROPONIN-I: 0.27 ng/mL — AB

## 2014-07-14 LAB — COMPREHENSIVE METABOLIC PANEL
Albumin: 3.3 g/dL — ABNORMAL LOW
Alkaline Phosphatase: 113 U/L
Anion Gap: 13 (ref 7–16)
BUN: 45 mg/dL — ABNORMAL HIGH
Bilirubin,Total: 0.4 mg/dL
CO2: 26 mmol/L
CREATININE: 2.88 mg/dL — AB
Calcium, Total: 9.4 mg/dL
Chloride: 97 mmol/L — ABNORMAL LOW
EGFR (African American): 19 — ABNORMAL LOW
EGFR (Non-African Amer.): 16 — ABNORMAL LOW
GLUCOSE: 234 mg/dL — AB
POTASSIUM: 6.1 mmol/L — AB
SGOT(AST): 28 U/L
SGPT (ALT): 22 U/L
SODIUM: 136 mmol/L
TOTAL PROTEIN: 6.7 g/dL

## 2014-07-14 LAB — CBC WITH DIFFERENTIAL/PLATELET
BASOS ABS: 0 10*3/uL (ref 0.0–0.1)
BASOS PCT: 0.6 %
EOS ABS: 0.1 10*3/uL (ref 0.0–0.7)
Eosinophil %: 1.5 %
HCT: 33.7 % — AB (ref 35.0–47.0)
HGB: 9.8 g/dL — ABNORMAL LOW (ref 12.0–16.0)
LYMPHS PCT: 15.8 %
Lymphocyte #: 1.2 10*3/uL (ref 1.0–3.6)
MCH: 27.7 pg (ref 26.0–34.0)
MCHC: 29.1 g/dL — ABNORMAL LOW (ref 32.0–36.0)
MCV: 95 fL (ref 80–100)
Monocyte #: 0.7 x10 3/mm (ref 0.2–0.9)
Monocyte %: 9.1 %
Neutrophil #: 5.4 10*3/uL (ref 1.4–6.5)
Neutrophil %: 73 %
PLATELETS: 238 10*3/uL (ref 150–440)
RBC: 3.53 10*6/uL — ABNORMAL LOW (ref 3.80–5.20)
RDW: 23.5 % — ABNORMAL HIGH (ref 11.5–14.5)
WBC: 7.4 10*3/uL (ref 3.6–11.0)

## 2014-07-14 LAB — CK TOTAL AND CKMB (NOT AT ARMC)
CK, TOTAL: 22 U/L — AB
CK-MB: 3.9 ng/mL

## 2014-07-14 LAB — PROTIME-INR
INR: 1.9
Prothrombin Time: 22 secs — ABNORMAL HIGH

## 2014-07-14 LAB — PRO B NATRIURETIC PEPTIDE: B-Type Natriuretic Peptide: 2133 pg/mL — ABNORMAL HIGH

## 2014-08-02 ENCOUNTER — Emergency Department
Admit: 2014-08-02 | Disposition: A | Discharge status: Other Acute Inpatient Hospital | Payer: Self-pay | Admitting: Emergency Medicine

## 2014-08-02 LAB — CBC WITH DIFFERENTIAL/PLATELET
BASOS ABS: 0 10*3/uL (ref 0.0–0.1)
Basophil %: 0.2 %
EOS ABS: 0 10*3/uL (ref 0.0–0.7)
Eosinophil %: 0 %
HCT: 30.2 % — ABNORMAL LOW (ref 35.0–47.0)
HGB: 9.6 g/dL — AB (ref 12.0–16.0)
LYMPHS ABS: 0.2 10*3/uL — AB (ref 1.0–3.6)
LYMPHS PCT: 1.6 %
MCH: 29 pg (ref 26.0–34.0)
MCHC: 31.8 g/dL — AB (ref 32.0–36.0)
MCV: 91 fL (ref 80–100)
MONOS PCT: 5.7 %
Monocyte #: 0.8 x10 3/mm (ref 0.2–0.9)
Neutrophil #: 12.8 10*3/uL — ABNORMAL HIGH (ref 1.4–6.5)
Neutrophil %: 92.5 %
Platelet: 190 10*3/uL (ref 150–440)
RBC: 3.31 10*6/uL — ABNORMAL LOW (ref 3.80–5.20)
RDW: 19.7 % — AB (ref 11.5–14.5)
WBC: 13.8 10*3/uL — ABNORMAL HIGH (ref 3.6–11.0)

## 2014-08-02 LAB — URINALYSIS, COMPLETE
BILIRUBIN, UR: NEGATIVE
GLUCOSE, UR: NEGATIVE mg/dL (ref 0–75)
KETONE: NEGATIVE
Nitrite: NEGATIVE
Ph: 5 (ref 4.5–8.0)
Protein: 100
Specific Gravity: 1.015 (ref 1.003–1.030)

## 2014-08-02 LAB — COMPREHENSIVE METABOLIC PANEL
ANION GAP: 8 (ref 7–16)
Albumin: 3 g/dL — ABNORMAL LOW
Alkaline Phosphatase: 133 U/L — ABNORMAL HIGH
BILIRUBIN TOTAL: 0.5 mg/dL
BUN: 44 mg/dL — AB
CHLORIDE: 95 mmol/L — AB
Calcium, Total: 10.9 mg/dL — ABNORMAL HIGH
Co2: 31 mmol/L
Creatinine: 1.61 mg/dL — ABNORMAL HIGH
EGFR (African American): 38 — ABNORMAL LOW
EGFR (Non-African Amer.): 33 — ABNORMAL LOW
Glucose: 158 mg/dL — ABNORMAL HIGH
Potassium: 5.9 mmol/L — ABNORMAL HIGH
SGOT(AST): 33 U/L
SGPT (ALT): 27 U/L
Sodium: 134 mmol/L — ABNORMAL LOW
TOTAL PROTEIN: 7.2 g/dL

## 2014-08-02 LAB — LACTIC ACID, PLASMA: Lactic Acid, Venous: 0.7 mmol/L

## 2014-08-02 LAB — PROTIME-INR
INR: 2.1
Prothrombin Time: 24.1 secs — ABNORMAL HIGH

## 2014-08-02 LAB — TROPONIN I: Troponin-I: 0.03 ng/mL

## 2014-08-02 NOTE — Consult Note (Signed)
PATIENT NAME:  Rachel SaxonAUB, Yaneli MR#:  [id number] DATE OF BIRTH:  [birth year]  DATE OF CONSULTATION:  04/13/2011  REFERRING PHYSICIAN:   CONSULTING PHYSICIAN:  Scot Junobert T. Kailia Starry, MD  HISTORY OF PRESENT ILLNESS:  The patient is a 69 year old white female who took an overdose of Valium, had respiratory arrest, and has been on a ventilator since, unable to wean. Plans are to do a trach and a feeding tube.  I am consulted for assistance with the feeding tube.   PAST MEDICAL HISTORY:  1. Hypertension.  2. Hyperlipidemia.  3. 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  4. Gout.  5. Sleep apnea.  6. Coronary artery disease.  7. Previous attempted suicide.   PAST SURGICAL HISTORY:  1. Tonsillectomy.  2. Right foot surgery.  3. Rotator cuff repair.  4. Right total knee replacement.  5. Left total knee replacement.   ALLERGIES: No known drug allergies.   REVIEW OF SYSTEMS: Unable to obtain. The patient is sedated and on a ventilator.   FAMILY HISTORY:  Per records, no hypertension or diabetes.   PHYSICAL EXAMINATION:  VITAL SIGNS: T-max 102.5, pulse 72, respirations 18, blood pressure 106/54, ventilator assisted pulse rate 74.  GENERAL: Very obese white female on a vent with endotracheal tube and orogastric tube in place.  HEENT: Conjunctivae pink. Sclerae anicteric.  Head is atraumatic.    NECK: Trachea is in the midline.   CHEST: Clear in the anterior fields.   HEART: No murmurs I can hear.   ABDOMEN: Soft, obese. A few bowel sounds are present.   SKIN: Warm and dry   ASSESSMENT: The patient ventilator-dependent needs a percutaneous feeding tube. Dr. Katrinka BlazingSmith and I will attempt to do this tomorrow. She is somewhat obese. It may be a little difficult but I think this is manageable endoscopically. We will do it at the bedside in the Critical Care Unit.   She is on Zosyn antibiotic. Consideration could be made for a dose of something else an hour before the  procedure. She had been on Lovenox; that has been held. We will check a proTime and CBC in the morning.   ____________________________ Scot Junobert T. Rashay Barnette, MD rte:bjt D: 04/13/2011 19:14:00 ET T: 04/14/2011 05:37:14 ET JOB#: [id number]  cc: Scot Junobert T. Ventura Leggitt, MD, <Dictator> Ollen GrossPaul S. Willeen CassBennett, MD Steele SizerMark A. Crissman, MD J. Renda RollsWilton Smith, MD Scot JunOBERT T Kaitelyn Jamison MD ELECTRONICALLY SIGNED 04/20/2011 12:15

## 2014-08-02 NOTE — Consult Note (Signed)
           Electronic Signatures: Lurline DelIftikhar, Cenia Zaragosa (MD)  (Signed 05-Jan-13 12:04)  Authored: Brief Consult Note   Last Updated: 05-Jan-13 12:04 by Lurline DelIftikhar, Jomel Whittlesey (MD)

## 2014-08-02 NOTE — Consult Note (Signed)
Brief Consult Note: Diagnosis: S/P valium overdose.   Patient was seen by consultant.   Consult note dictated.   Recommend further assessment or treatment.   Orders entered.   Discussed with Attending MD.   Comments: The patient is off the vent. She is no longer suicidal.   PLAN: 1. We may d/c sitter for suicidal ideation.   2. I will follow up.  Electronic Signatures: Kristine LineaPucilowska, Jolanta (MD)  (Signed 09-Jan-13 11:59)  Authored: Brief Consult Note   Last Updated: 09-Jan-13 11:59 by Kristine LineaPucilowska, Jolanta (MD)

## 2014-08-02 NOTE — Consult Note (Signed)
PATIENT NAME:  Rachel Brady, Rachel Brady MR#:  [id number] DATE OF BIRTH:  Jun 12, 1945  DATE OF CONSULTATION:  04/12/2011  REFERRING PHYSICIAN:  Dr. Belia HemanKasa  CONSULTING PHYSICIAN:  Ollen GrossPaul S. Willeen CassBennett, MD  REASON FOR CONSULTATION: Respiratory failure.   HISTORY OF PRESENT ILLNESS: The patient is a 69 year old female in the CCU after drug overdose with Valium. She has a history of depression and apparently took the overdose because of financial issues she was experiencing. She was admitted to the ICU and was monitored and apparently on December 20th required emergent intubation due to respiratory failure. Since then they have been unable to wean the patient from the ventilator and tracheostomy has been requested. Apparently after her intubation she was noted to have mucus plugging in the lungs which resulted in opacification of the left hemithorax. This was subsequently addressed with a bronchoscopy to remove secretions. Since then they have been treating her for possible pneumonia with culture growing out Methicillin-sensitive Staphylococcus aureus.    PAST MEDICAL HISTORY:  1. Left lower extremity cellulitis which resolved during the hospital admission. 2. Altered mental status due to encephalopathy from drug overdose. 3. Hypertension. 4. Coronary disease. 5. Chronic diastolic failure, compensated. 6. Diabetes. 7. History of obstructive sleep apnea, on CPAP at home. 8. History of previous suicide attempts. 9. Likely history of chronic atrial fibrillation.  10. History of gout. 11. Hyperlipidemia.  SOCIAL HISTORY: Apparently no history of tobacco or alcohol abuse.   FAMILY HISTORY: No history of hypertension or diabetes.   PAST SURGICAL HISTORY:  1. Tonsillectomy. 2. Right foot surgery. 3. Left knee arthroscopy. 4. Rotator cuff repair. 5. Right total knee replacement. 6. Left total knee replacement.  MEDICATIONS:  1. Precedex drip.  2. Zofran 4 mg IV push q.4 hours p.r.n. nausea. 3. Labetalol 10 mg  IV q.6 hours p.r.n. hypertension.  4. Nitroglycerin patch 0.3 mg topically daily.  5. Flovent HFA 220 mcg 2 puffs q.12 hours.  6. Protonix 40 mg IV q.6 a.m.  7. Mycostatin cream apply q.8 hours to affected area. 8. Norvasc 10 mg per NG tube daily.  9. Coreg 12.5 mg per NG tube b.i.d.  10. Versed 2 mg IV push q.2 hours p.r.n. agitation.  11. Ativan 2 mg IV q.4 hours p.r.n. agitation.  12. Combivent 8 puffs q.4 hours. 13. Lovenox 40 mg subcutaneous b.i.d.  14. Tylenol 325 mg 2 p.o. q.4 hours p.r.n. pain or temperature.  15. Insulin glargine 53 units sub-Q q.24 hours. 16. Sliding scale insulin.  17. Zosyn 3.375 grams IV q.8 hours.   ALLERGIES: None.   PHYSICAL EXAMINATION:   VITAL SIGNS: Pulse 66, respirations 18, blood pressure 165/74.   GENERAL: Obese female, intubated and sedated, unable to respond.   EARS: External ears are unremarkable. Ear canals are partially occluded with cerumen. Tympanic membranes cannot be visualized.   NASAL: External nose unremarkable. Nasal cavity is clear. No purulence or polyps are seen. Septum is relatively straight.   ORAL CAVITY AND OROPHARYNX: Teeth, lips, gums, and posterior pharynx are clear as best I can ascertain, however, exam is limited due to her intubated state and difficulty with cooperating with examination.   NECK: Neck is moderately obese but free of any masses or palpable adenopathy. Salivary glands are soft without masses. There is no thyromegaly. I can feel good tracheal landmarks including the cricoid cartilage.   NEUROLOGIC: Cannot be obtained due to the patient's intubated state.   DATA REVIEW: Hemoglobin 11.6. Platelet count 276.   ASSESSMENT: Ms. Rachel Brady has respiratory  failure after Valium overdose. Dr. Belia Heman in conjunction with Dr. Meredeth Ide in Pulmonology have been working to try to improve her pulmonary status to get her weaned from the ventilator but this has been unsuccessful thus far and they have recommended proceeding with  tracheostomy. I will talk with [name], her power-of-attorney, and obtain consent for tracheostomy including discussing the risks of scarring, infection, recurrent laryngeal nerve injury, and the possibility of long-term tracheostomy care being required.   ____________________________ Ollen Gross. Willeen Cass, MD psb:drc D: 04/12/2011 18:36:15 ET T: 04/13/2011 09:41:37 ET JOB#: [id number]  cc: Ollen Gross. Willeen Cass, MD, <Dictator> Sandi Mealy MD ELECTRONICALLY SIGNED 04/13/2011 11:32

## 2014-08-02 NOTE — Op Note (Signed)
PATIENT NAME:  Rachel SaxonAUB, Rachel Brady MR#:  [id number] DATE OF BIRTH:  1945-09-20  DATE OF PROCEDURE:  04/14/2011  PREOPERATIVE DIAGNOSIS: Respiratory failure, encephalopathy.   POSTOPERATIVE DIAGNOSIS: Respiratory failure, encephalopathy.   PROCEDURE: Percutaneous endoscopic gastrostomy.  SURGEON:  Rachel Brady, M.D.   GASTROENTEROLOGIST:  Rachel Brady, M.D.  ANESTHESIA: Intravenous sedation, local 1% Xylocaine injection.   INDICATIONS: This 69 year old female was admitted to the hospital after a suicide attempt with respiratory failure and encephalopathy and has been maintained on the ventilator, has been given nasogastric tube feeding but now needing feeding gastrostomy for more long-term nutritional support.   DESCRIPTION OF PROCEDURE: The patient was in her Intensive Care Unit bed. She was being monitored with intermittent blood pressure recordings and EKG monitor and on the ventilator. Dr. Mechele CollinElliott began the procedure with the endoscopy and he will create a separate note regarding his findings.  After initial survey of the stomach, the patient was positioned in the supine position in reverse Trendelenburg position. The light of the endoscope could be seen shining through the abdominal wall in the left upper quadrant. I impressed this site with a finger, and we could see the impression of the finger with the video monitor of the gastric wall. Next, this site was prepared with alcohol solution and draped in a sterile manner and we also painted the intended site with Betadine. The skin was infiltrated with 1% Xylocaine. Next, four Brown-Mueller T-fasteners were inserted into the stomach and traction was applied as the sutures were held with cotton balls and crimps. Next, a transversely-oriented 8-mm incision was made. There was some abrupt venous bleeding and we held pressure for about a minute and then the bleeding practically resolved. A Seldinger needle was inserted midway between the four  T-fasteners. Next the guidewire was inserted and the needle withdrawn. The tract was serially dilated with dilators and ultimately inserted an 18-gauge magna port feeding tube, inflated the balloon with 15 mL of water. The tube was opened so that carbon dioxide could escape from the stomach and Dr. Mechele CollinElliott then removed the endoscope. Next the accompanying plastic disk was advanced down to the cotton crimps and was sutured to the skin with 2-0 silk four-point fixation. Dressings were applied with benzoin and 2-inch paper tape and the catheter was attached to a biliary drainage bag.  The patient tolerated this satisfactorily. Vital signs remained stable and the patient remained in the Intensive Care Unit for continued care.    ____________________________ J. Rachel RollsWilton Kahron Kauth, MD jws:bjt D: 04/14/2011 19:01:58 ET T: 04/15/2011 11:09:31 ET JOB#: [id number]  cc: Adella HareJ. Rachel Stephine Langbehn, MD, <Dictator> Adella HareWILTON J Suhaylah Wampole MD ELECTRONICALLY SIGNED 05/14/2011 15:04

## 2014-08-02 NOTE — Discharge Summary (Signed)
PATIENT NAME:  Rachel SaxonAUB, [name] MR#:  [id number] DATE OF BIRTH:  02-26-1946  DATE OF ADMISSION:  03/26/2011 DATE OF DISCHARGE:  04/20/2011  ADDENDUM:   DISCHARGE DIAGNOSIS: Ventilator associated methicillin-resistant staph aureus pneumonia.   HOSPITAL COURSE:  Ventilator associated methicillin-resistant staph aureus. The patient was started on IV vancomycin. This will be continued for about 10 days. Day six vancomycin levels were high and they were difficult to manage. This was the same day as transfer. It was decided after the original discharge summary was dictated that the patient would be switched over to Zyvox for an additional four days and will receive therapy.   ____________________________ Gracelyn NurseJohn D. Johnston, MD jdj:rbg D: 04/20/2011 15:59:21 ET T: 04/21/2011 16:24:46 ET JOB#: [id number]  cc: Gracelyn NurseJohn D. Johnston, MD, <Dictator> Herbon E. Meredeth IdeFleming, MD Renford DillsGregory G. Schnier, MD Cammy CopaPaul H. Juengel, MD Dory LarsenKurian D. Kasa, MD Gracelyn NurseJOHN D JOHNSTON MD ELECTRONICALLY SIGNED 04/23/2011 20:00

## 2014-08-02 NOTE — Discharge Summary (Signed)
PATIENT NAME:  Gwyndolyn SaxonAUB, Rhandi MR#:  [id number] DATE OF BIRTH:  05/27/1945  DATE OF ADMISSION:  03/26/2011 DATE OF DISCHARGE:  04/20/2011  ADDENDUM:   CURRENT MEDICATION LIST:  The patient is no longer on vancomycin. She did have four days of vancomycin today. It was changed over to Zyvox 600 mg b.i.d. and she will complete 10 more days.   ____________________________ Gracelyn NurseJohn D. Harvard Zeiss, MD jdj:ap D: 04/20/2011 10:37:51 ET T: 04/20/2011 11:01:11 ET JOB#: [id number]  cc: Gracelyn NurseJohn D. Hawa Henly, MD, <Dictator> Gracelyn NurseJOHN D Cia Garretson MD ELECTRONICALLY SIGNED 04/23/2011 20:00

## 2014-08-02 NOTE — Consult Note (Signed)
Brief Consult Note: Diagnosis: acute respiratory failure and encephalopathy secondary to drug overdose, morbid obesity.   Patient was seen by consultant.   Consult note dictated.   Comments: Patient has been febrile recently, unknown source, ID following.  Constipation.  Scheduled for trach Monday.  Dr.  Katrinka BlazingSmith to assess this evening for final decision on placement of PEG.  Electronic Signatures: Carmell AustriaCollins, Ann (PA)  (Signed 03-Jan-13 15:18)  Authored: Brief Consult Note   Last Updated: 03-Jan-13 15:18 by Carmell Austriaollins, Ann (PA)

## 2014-08-02 NOTE — Consult Note (Signed)
Impression: 69yo WF w/ h/o DM admitted with respiratory failure following drug OD who has high fevers.  She had been on antibiotics from 12/17 through 12/26, presumedly for possible aspiration pneumonia.  Her CXR initially showed atelectasis vs infiltrate, but progressed to be white out.  She underwent bronchoscopy due to mucus plugging on 12/20.  Culture grew Methacillin Sensitive Staph aureus.  She has had some low grade temp elevations at times, but had not been having any temps over 101 until 12/29.  She has been spiking temps intermittently since then.  Her CXR shows improvement. No recent cultures have been obtained.  Will get u/a, sputum cx, blood cultures today. Continue zosyn for now until cultures return. She follows commands to move her fingers and toes, but has less movement on the right.  Does not appear to be anoxic injury. Continue vent support. If cultures remain negative, but she continues to spike temps, would change her central line. If she develops loose stool, would send C. diff PCR.  Electronic Signatures: Florenda Watt, Rosalyn GessMichael E (MD)  (Signed on 02-Jan-13 14:44)  Authored  Last Updated: 02-Jan-13 14:44 by Rayan Ines, Rosalyn GessMichael E (MD)

## 2014-08-02 NOTE — Consult Note (Signed)
PATIENT NAME:  Rachel Brady, Rachel Brady MR#:  [id number] DATE OF BIRTH:  31-Dec-1945  DATE OF CONSULTATION:  04/13/2011  REFERRING PHYSICIAN:   CONSULTING PHYSICIAN:  Scot Junobert T. Elliott, MD  ADDENDUM:  LABORATORY DATA: Glucose 261, BUN 22, creatinine 0.78, sodium 147, potassium 3.4, chloride 105, CO2 31, white count 8.6, hemoglobin 12.3, platelet count 278. Blood cultures no growth. Urinalysis cloudy. Red and white cells present.   PLAN: Get a proTime in the morning. Discussed with Dr. Katrinka BlazingSmith the need for an extra antibiotic.    ____________________________ Scot Junobert T. Elliott, MD rte:bjt D: 04/13/2011 19:16:41 ET T: 04/14/2011 05:45:23 ET JOB#: [id number]  cc: Scot Junobert T. Elliott, MD, <Dictator> Scot JunOBERT T ELLIOTT MD ELECTRONICALLY SIGNED 04/20/2011 12:15

## 2014-08-02 NOTE — H&P (Signed)
PATIENT NAME:  Rachel Brady, Rachel Brady MR#:  [id number] DATE OF BIRTH:  01-Jul-1945  DATE OF ADMISSION:  03/26/2011  PRIMARY CARE PHYSICIAN:  Dr. Dossie Arbour ER PHYSICIAN: Dr. Darnelle Catalan  CHIEF COMPLAINT:  Drug overdose.  HISTORY OF PRESENT ILLNESS: This is a 69 year old female patient brought in by EMS for drug overdose with Valium. According to the EMS, the patient took about 50 tablets of 2 mg of Valium this afternoon. History is also obtained from the patient's roommate, Mr. Pricilla Holm. The patient was seen last night. She was fine. She uses a CPAP machine so the patient's roommate thought she was still sleeping in the afternoon, but later on he checked on her in the afternoon around 2 o'clock.  At that time the patient's dog was barking so he went and checked and she was snoring.  Then he thought she might have taken extra drugs and he called the ambulance. By the time EMS arrived she was lethargic and the bottle showed that she took around 50 tablets of Valium along with Percocet 5/325, three tablets. The patient has been having depression recently due to her financial status. The patient has been living with the roommate for the last 30 years, Mr. Pricilla Holm.  Recently they had to move out because they could not pay the rent. According to Pricilla Holm she is worried too much about it and has also been depressed.   PAST MEDICAL HISTORY: 1. Hypertension.  2. Diastolic heart failure. 3. Hyperlipidemia. 4. Degenerative joint disease. 5. Gout.  6. History of  attempted suicide before.   7. History of sleep apnea, uses CPAP machine. 8. Coronary artery disease.  PAST SURGICAL HISTORY:  1. Tonsillectomy.  2. Right foot surgery.  3. Left knee arthroscopy. 4. Rotator cuff repair.  5. Right total knee replacement.  6. Left total knee replacement in 10/2009.  ALLERGIES: No known allergies.   SOCIAL HISTORY: Single. Denies any tobacco or alcohol abuse.   FAMILY HISTORY: No history of hypertension or diabetes according  to the old charts.   REVIEW OF SYSTEMS: Unobtainable because she is lethargic.  PHYSICAL EXAMINATION: VITAL SIGNS:  Blood pressure172/87, pulse oximetry on 2 liters 97% saturation. Heart rate 72.   GENERAL: The patient is a 69 year old obese female, lethargic but able answer questions,  deeply sedated at this time.   HEENT: Pupils equal, round, reactive to light and accommodation. Extraocular movements intact. No conjunctivitis. Hearing is intact.   NECK: No thyroid enlargement. No lymphadenopathy. No JVD.   RESPIRATORY: Bilaterally clear to auscultation. No wheeze. No rales.   CARDIOVASCULAR: S1, S2 regular. No murmurs.   ABDOMEN: Obese, soft. Bowel sounds present. No organomegaly.   MUSCULOSKELETAL: The patient has right leg dressing present. 1+ edema in both legs.   SKIN: Has chronic stasis dermatitis on the right leg.   NEURO:  Deeply sedated.    PSYCH: The patient has depression and she said that she was depressed and took Valium. She knows she is at [facility].  MEDICATIONS:  1. WelChol  625 mg p.o., 3 tablets 2 times daily.  2. Pradaxa 150 mg p.o. b.i.d.  3. Oxycodone 5 to 10 mg every four hours as needed.  4. Metformin 1 gram p.o. b.i.d.  5. Lipitor 40 mg p.o. daily.  6. Glipizide 5 mg p.o. daily.  7. Coreg 50 mg p.o. b.i.d.  8. Amlodipine 10 mg p.o. daily.   LABORATORY, DIAGNOSTIC AND RADIOLOGICAL DATA: Acetaminophen level less than two.   Electrolytes: Sodium 143, potassium 3.5, chloride  105, bicarbonate 28, BUN 12, creatinine 0.56, glucose 119. Ethanol level less than 3. WBC 9.2, hemoglobin 13.1, hematocrit 39.2, platelets 158. Salicylate level less than 1.7, thyroid hormone 1.34. EKG showed T-wave inversions in V1, V2, V3, V4, and V5. Rate 70 beats per minute.   ASSESSMENT AND PLAN:  1. The patient is a [age] year old female with a benzodiazepine overdose. The patient has suicidal ideation. Admitting her to the Intensive Care Unit for  monitoring her respiratory status. If she is stable by tomorrow, if she is off sedation, she needs to be seen by a psychiatrist and possible inpatient psychiatric transfer because she is having suicidal ideation with depression.  2. High blood pressure and history of diastolic heart failure. Hold the Coreg and also other p.o. medications at this time because she is lethargic. 3. Diabetes mellitus type 2. Hold the metformin and glipizide. Continue sliding scale with coverage and check hemoglobin A1c tomorrow.  4. She will be admitted to the Intensive Care Unit for monitoring of her breathing. We will keep her n.p.o. at this time.  Discussed with Mr. Pricilla Holmucker, who is the patient's roommate, phone number [phone].   TOTAL TIME SPENT ON HISTORY AND PHYSICAL:  About 60 minutes. ____________________________ Katha HammingSnehalatha Zhuri Krass, MD sk:bjt D: 03/26/2011 19:31:33 ET T: 03/27/2011 06:45:59 ET JOB#: [id number]  cc: Katha HammingSnehalatha Dorene Bruni, MD, <Dictator> Steele SizerMark A. Crissman, MD Katha HammingSNEHALATHA Teresha Hanks MD ELECTRONICALLY SIGNED 04/16/2011 21:43

## 2014-08-02 NOTE — Consult Note (Signed)
PATIENT NAME:  Rachel Brady, Rachel Brady MR#:  [id number] DATE OF BIRTH:  [birth year]  DATE OF CONSULTATION:  04/19/2011  REFERRING PHYSICIAN:  Epifanio Lesches, MD CONSULTING PHYSICIAN:  Lenda Baratta B. Kell Ferris, MD  REASON FOR CONSULTATION: To evaluate a patient after presumed suicide attempt.   IDENTIFYING DATA: Ms. Hargrove is a 69 year old female with no known psychiatric history.   CHIEF COMPLAINT: The patient is unable to state.   HISTORY OF PRESENT ILLNESS: Ms. Dreibelbis was admitted to the Critical Care Unit on 03/26/2011 after an overdose on Valium. I did see her on 03/27/2011 in the Critical Care Unit, but she was unable to participate in psychiatric interview. She was later intubated and has been on a respirator until earlier today. She had pneumonia and cellulitis. She remained on suicide precautions with a sitter in the room throughout her hospitalization. I was asked today to evaluate if the patient is still suicidal and if she requires a Actuary. I do not have any information from the patient as she is unable to talk. At the beginning of her stay, on 03/27/2011, I met with her housemates. One of them is her healthcare power of attorney. He was able to explain the current situation in great detail. Reportedly the patient has been living with her friends, another couple, for the past 30 years. The women used to work together. After retirement they continued their relationship. It was not so much dictated by financial necessity as by lasting friendship. More recently the health of the couple started deteriorating, the gentleman suffered a stroke, and his wife required additional help. Their son who is now healthcare power of attorney for everybody moved into the house. Per his own report, the family started experiencing financial difficulties  and on 03/27/2011 I was told that they are being evicted. She decided that it would be best if they split into two mobile homes, himself and his parents living in one and  the patient living next door. Apparently a thought of separation caused the suicide attempt by Valium overdose. Per care manager report, the friends have changed the story now and they say that they were able to find some of her missing Valiums in another pillbox. At the time I spoke with the family, the young man was concerned that the patient did overdose on purpose.   PAST PSYCHIATRIC HISTORY: Reportedly none. The patient was prescribed Prozac and Valium by her private primary provider. She has never been hospitalized. There were no suicide attempts and no history of substance abuse. 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   FAMILY PSYCHIATRIC HISTORY: Unknown.   PAST MEDICAL HISTORY:  1. Coronary artery disease. 2. Diabetes. 3. Congestive heart failure.  ADMISSION MEDICATIONS:  1. Welchol 625 mg 2 tablets three times daily. 2. Pradaxa 150 mg twice daily.  3. Nitrostat 0.4 mg sublingually as needed.  4. Metformin 1000 mg twice daily.  5. Glipizide 5 mg daily.  6. Prozac 20 mg daily.  7. Valium 1 mg four times daily. 8. Carveidolol 25 mg two tabs twice daily. 9. Zocor 80 mg daily. 10. Amlodipine 10 mg daily.   ALLERGIES: No known drug allergies.   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 She has been living with her friends for 30 years. The son came into the picture not long ago. Apparently she used to take care of her friend's before the  son moved in.  REVIEW OF SYSTEMS: The patient was unable to provide.   PHYSICAL EXAMINATION:   VITAL SIGNS: Blood pressure 168/75, pulse 108, respirations 23, and temperature 97.5   GENERAL: This is an obese patient with trach and unable to communicate verbally.  The rest of the physical examination is deferred to her primary provider.   LABS/STUDIES: Chemistries: Blood glucose 118, BUN 13, creatinine 0.61, sodium 146, and potassium  3.8. Magnesium 3.4. CBC within normal limits. INR 1.2.   Urinalysis: Over 300 red blood cells and 10 white blood cells. Leukocyte esterase negative.  MENTAL STATUS EXAMINATION: The patient is alert. There is psychomotor retardation. She maintains good eye contact. At times she struggles to understand questions. She is able to answer to some questions with slight movement of her head. She does not appear to be in distress or in physical pain. She knows that she is in the hospital. She seems not to know why she is here. She does not think that she had a heart attack or surgery. When asked if she overdosed on Valium she did not respond. She indicated that she is glad to be live. She denied wanting to hurt herself. She indicated understanding of her current situation and that she was asked to let staff know should she be distressed. She was told that I will return to see her at a later time. She agreed to talk to me again. I was unable to assess her cognition formally. She is cool and collected. No agitation. No evidence of psychotic symptoms.  SUICIDE RISK ASSESSMENT: This is a patient with a history of depression and anxiety judging from her medication list who overdosed on Valium in the face of serious social stressors and who denies suicidal ideation, intention, or plan now. It appears that she is not capable of planning or executing a suicide attempt now. She seems slightly oversedated and it is possible that she could hurt herself unintentionally.   DIAGNOSES:   AXIS I:  1. Mood disorder, not otherwise specified.  2. Adjustment disorder with depressed mood.  AXIS II: Deferred.   AXIS III: Acute respiratory failure, lower left extremity cellulitis, coronary artery disease with atrial fibrillation, congestive heart failure, and diabetes.   AXIS IV: Chronic and acute physical illness, mental illness, loss of way of life, and relationship problems.   AXIS V: GAF 15.        PLAN: I do not feel  that she needs a sitter because of her suicidality. I am not going to restart her medications yet. I would prefer that she was more aware and awake to have a sensible discussion about her medicines. I would avoid benzodiazepines at this point. ____________________________ Wardell Honour. Bary Leriche, MD jbp:slb D: 04/19/2011 14:47:35 ET T: 04/19/2011 15:16:57 ET JOB#: [id number]  cc: Lyndell Gillyard B. Bary Leriche, MD, <Dictator> Clovis Fredrickson MD ELECTRONICALLY SIGNED 04/19/2011 23:35

## 2014-08-02 NOTE — Consult Note (Signed)
PATIENT NAME:  Rachel SaxonAUB, Rachel Brady MR#:  [id number] DATE OF BIRTH:  Jun 18, 1945  DATE OF CONSULTATION:  04/13/2011  REFERRING PHYSICIAN:  Lynnae Prudeobert Elliott, MD  CONSULTING PHYSICIAN:  Ruffin FrederickAnn M. Lindsee Labarre, Brady  REASON FOR CONSULTATION: Respiratory failure with failure to extubate.   HISTORY OF PRESENT ILLNESS: This is a 69 year old female who was admitted on December 16th   after an overdose on Valium. She required intubation on December 21st due to respiratory failure. She has since failed to wean from the vent. She is scheduled for trach placement on Monday. She has been spiking fevers with negative blood cultures and urine cultures. Sputum culture grew MRSA. She is currently on Zosyn. She has encephalopathy secondary to the overdose. The patient has been on enteral feedings at 35 mL per hour. She was on a bowel regimen of docusate sodium and senna previously but this was stopped 10 days ago due to significant diarrhea requiring rectal tube. Her last bowel movement was five days ago per the chart. There are no residuals recorded from the tube feeding. She has had no prior abdominal surgeries. History is obtained from the chart and nursing as the patient is nonverbal.   PAST MEDICAL HISTORY:  1. Hypertension.  2. History of heart failure, recent echocardiogram during this hospitalization shows ejection fraction of 55%.  3. Hyperlipidemia.  4. Degenerative joint disease.  5. Gout.  6. Sleep apnea.  7. Coronary artery disease. 8. History of depression with suicide attempts.   PAST SURGICAL HISTORY:  1. Tonsillectomy.  2. Right foot surgery. 3. Left knee arthroscopy. 4. Rotator cuff repair.  5. Right total knee replacement. 6. Left total knee replacement.   SOCIAL HISTORY: The patient denies any tobacco or alcohol use. She has no family to contact. There is a healthcare power-of-attorney, Nelly RoutJames Gregory. She also has a Network engineerneighbor or friend in the area.   CURRENT MEDICATIONS:  1. Precedex drip.  2. Zofran  4 mg IV q.4 hours as needed. 3. Labetalol 10 mg IV q.6 hours as needed. 4. Nitroglycerin patch 0.3 mg daily.  5. Chlorhexidine oral mouthwash every 12 hours. 6. Flovent HFA 220 mcg 2 puffs q.12 hours. 7. Nystatin cream to abdominal folds every eight hours.  8. Amlodipine 10 mg daily.  9. Carvedilol 12.5 mg b.i.d.  10. Versed 2 mg IV as needed. 11. Ativan 2 mg IV as needed every four hours. 12. Combivent 8 puffs q.4 hours. 13. Tylenol 650 mg every four hours as needed.  14. Insulin sliding scale.  15. Zosyn 3.375 grams q.8 hours. 16. Zantac 50 mg IV q.12 hours.   ALLERGIES: No known drug allergies.   REVIEW OF SYSTEMS: Unable to obtain.   PHYSICAL EXAMINATION:   GENERAL: Critically ill appearing obese elderly Caucasian female.   VITAL SIGNS: Height 65 inches, weight 241 pounds. T-max earlier today was 102, temperature currently 100.4, pulse 66, respirations 17, blood pressure 132/70, pulse oximetry 98%.   HEENT: Pupils equal, round, and reactive to light. The patient does open her eyes to voice and shrug her shoulders when asked about pain. Oropharynx dry with ET tube in place. Nasogastric tube in place. Right IJ line also in place.   CARDIOVASCULAR: Heart rate is regular. No murmurs.   LUNGS: Clear without wheezing, rhonchi, or rales.   ABDOMEN: Obese, soft. Does not appear to be tender. No surgical incisions. There is some erythema in the intertriginous folds in the abdomen and beneath the breasts.   EXTREMITIES: 2+ pulses in all four extremities. Minimal  edema.   NEUROLOGIC: The patient does awaken to voice.   LABORATORY, DIAGNOSTIC, AND RADIOLOGICAL DATA: The patient has had negative. C. difficile test, negative urine and blood cultures. Glucose is consistently elevated around 200. Sodium is elevated. White blood cell count was 11.9 yesterday, now 8.6. Hemoglobin 12.3. Platelet count 278.   Most recent chest x-ray from this morning shows diffuse interstitial infiltrate,  edematous versus infectious, also multiple rib fractures noted on the left, possibly chronic.   IMPRESSION:  1. Respiratory failure. 2. Encephalopathy.   PLAN: Power-of-attorney would like the patient to have a PEG placed and consent will be obtained through him. Dr. Katrinka Blazing will be by to see the patient later today to see if percutaneous endoscopic tube could be done. He will most likely want her to be afebrile and demonstrate bowel function prior to surgery. Will follow.  ____________________________ Ruffin Frederick. Thomasena Edis, [place] amc:drc D: 04/13/2011 15:04:55 ET T: 04/13/2011 15:33:27 ET JOB#: [id number]  cc: Ruffin Frederick. Thomasena Edis, [place], <Dictator> Rachel Brady ELECTRONICALLY SIGNED 04/13/2011 16:52

## 2014-08-02 NOTE — Consult Note (Signed)
PATIENT NAME:  Rachel Brady, Rachel Brady MR#:  [id number] DATE OF BIRTH:  06-07-1945  DATE OF CONSULTATION:  04/12/2011  REFERRING PHYSICIAN:  Delfino LovettVipul Shah, MD  CONSULTING PHYSICIAN:  Rosalyn GessMichael E. Lyndsey Demos, MD  REASON FOR CONSULTATION: Fever and respiratory failure.   HISTORY OF PRESENT ILLNESS: The patient is a 69 year old white female with a past history significant for diabetes who was admitted on December 16th with acute respiratory failure following an overdose of Valium. The patient was brought to the Emergency Room. She was admitted to the Critical Care Unit and intubated for airway protection. She is unable to provide any history, and the history is obtained exclusively from the chart. Initially, the patient was treated with Unasyn from December 17th through December 20th. She was then changed to Zosyn through December 22nd, and finally on Levaquin until December 26th. She began spiking temperatures on December 29th, over 101.5, and yesterday Zosyn was restarted. The only cultures that were obtained were a bronchoscopy on December 20th which was performed due to progressive increased pressures on the ventilator and worsening whiteout on the chest x-ray which showed a significant amount of mucus and a mucus plug, and the cultures grew methicillin sensitive Staphylococcus aureus. Blood cultures were drawn on December 24th as well and have been negative. She does have a central line in place. She remains on the ventilator. She is off all sedation and is lethargic but will follow simple commands.   ALLERGIES: None.   PAST MEDICAL/SURGICAL HISTORY:  1. Diabetes.  2. Hypertension.  3. Congestive heart failure.  4. Hypercholesterolemia.  5. Degenerative joint disease.  6. Gout.  7. Sleep apnea.  8. Coronary artery disease.  9. Status post bilateral total knee arthroplasties.   SOCIAL HISTORY: The patient has a roommate. She does not smoke or drink.   FAMILY HISTORY: No diabetes or hypertension, per the  History and Physical.   REVIEW OF SYSTEMS: Unable to obtain from the patient.   PHYSICAL EXAMINATION:  VITAL SIGNS: T-max of 102.3, T-current of 99.7, pulse 58, blood pressure 157/72, 99% on the ventilator. Vent settings include assist control with a rate of 18, spontaneous rate of zero, tidal volume of 454, minute ventilation of 8.1, FiO2 of 28%, PEEP of 5.0.   GENERAL: A 69 year old white female appearing critically ill but in no acute respiratory distress, intubated on the ventilator.   HEENT: Normocephalic, atraumatic. Pupils are equal, round and reactive to light. Extraocular motion is intact. Sclerae, conjunctivae, and lids are without evidence for emboli or petechiae. Oropharynx is difficult to examine due to patient having an endotracheal tube in place.   NECK: Midline trachea. No lymphadenopathy. No thyromegaly.   LUNGS: Clear to auscultation bilaterally with good air movement. No focal consolidation.   HEART: Regular rate and rhythm without murmur, rub, or gallop.   ABDOMEN: Soft, nontender, and nondistended. No hepatosplenomegaly. No hernia is noted.   EXTREMITIES: No evidence for tenosynovitis.   SKIN: No rashes. No stigmata of endocarditis, specifically no Janeway lesions or Osler nodes.   NEUROLOGIC: The patient is awake but appears lethargic. She was able to follow commands and move her left hand and fingers. She would also move the toes of both feet to command. It was more difficult to get her to move her right hand, but she eventually was able to wiggle her fingers on her right hand but certainly seem to have more right-sided neglect. She was able to make eye contact, but further history is not possible.    PSYCHIATRIC:  Unable to assess due to her current clinical situation.   LABORATORY, DIAGNOSTIC AND RADIOLOGICAL DATA:  BUN 30, creatinine 0.80, bicarbonate 31, anion gap 11.  LFTs from admission were normal.  TSH has been normal on several checks during  hospitalization.  Toxicology screen was positive for benzodiazepines.  White count today is 11.9, with a hemoglobin 11.6, platelet count of 276, ANC of 10.4. Prior to today, her white count has been normal except on December 20th when it was elevated at 13.2. Her ANC has been intermittently high but has been slowly creeping up over the last three days. Blood cultures from December 24th show no growth.  A C. difficile PCR from December 24th is negative.  Sputum culture from bronchoscopy on December 20th is growing methicillin sensitive Staphylococcus aureus.  A urinalysis from December 27th was fairly unremarkable.  A chest x-ray from admission shows increased density in the left base compatible with atelectasis although underlying pneumonia could not be excluded. Subsequent chest x-rays showed consolidation in the left lung that waxed and waned over time. Most recent chest x-ray from December 31st showed improvement in recent infiltrates other than the chronic density in the left base.   IMPRESSION: A 69 year old white female with a past history significant for diabetes who was  admitted with respiratory failure following a drug overdose with high fevers.   RECOMMENDATIONS:   1. She has been on antibiotics from December 17th through December 26th, presumably for possible aspiration pneumonia. Her chest x-ray initially showed atelectasis versus infiltrate but progressed to be whiteout. She underwent bronchoscopy due to mucus plugging on December 20th. Cultures grew methicillin sensitive Staphylococcus aureus. She has had some low-grade temperature elevations at times during her hospitalization but has not been having any temperatures over 101 until December 29th. She has been spiking temperatures intermittently since then. Her chest x-ray shows improvement, however.  2. No recent culture has been obtained. We will get a urinalysis, sputum culture and blood cultures today.  3. We will continue Zosyn for  now until the cultures return.  4. She follows commands to move her fingers and toes but has less movement on the right. She does not appear to have an anoxic injury.  5. I would continue vent support.  6. If the cultures remain negative, but she continues to spike temperatures, I would change her central line.  7. If she develops loose stool, I would send a C. difficile PCR.  8. We will repeat a chest x-ray tomorrow.   This is a high-level Infectious Disease consult.  Thank you very much for involving me in Ms. Grilli's care.  ____________________________ Rosalyn Gess. Benn Tarver, MD meb:cbb D: 04/12/2011 14:55:59 ET T: 04/12/2011 16:37:29 ET JOB#: [id number]  cc: Rosalyn Gess. Luchiano Viscomi, MD, <Dictator> Jervon Ream E Zlatan Hornback MD ELECTRONICALLY SIGNED 04/17/2011 12:56

## 2014-08-02 NOTE — Consult Note (Signed)
PEG done by Dr. Katrinka BlazingSmith using the T wire system.  EGD showed mild gastritis only, esophagus and duodenum neg.  NG tube removed after successful PEG placement.  Wait 24 hours to use tube for feeding. Dietician consult placed.  Electronic Signatures: Scot JunElliott, Chenille Toor T (MD)  (Signed on 04-Jan-13 18:58)  Authored  Last Updated: 04-Jan-13 18:58 by Scot JunElliott, Koki Buxton T (MD)

## 2014-08-02 NOTE — Discharge Summary (Signed)
PATIENT NAME:  Rachel Brady, CHALUPA MR#:  [id number] DATE OF BIRTH:  03-Feb-1946  DATE OF ADMISSION:  03/26/2011 DATE OF DISCHARGE:  04/20/2011  DISCHARGE DIAGNOSES:  1. Acute hypoxic respiratory failure secondary to inability to protect airway thought to be from possible drug overdose.  2. Altered mental status with encephalopathy.  3. Left lower extremity cellulitis.  4. Coronary artery disease.  5. Atrial fibrillation.  6. History of diastolic heart failure with ejection fraction of 55%.  7. Non-insulin-dependent diabetes.  8. History of obstructive sleep apnea.  9. Previous history of suicide attempts.  10. Drug overdose being nonsuicidal via psychiatric evaluation.  11. Hypertension.  12. Degenerative joint disease.   13. Gout.   CURRENT MEDICATIONS:  1. Zofran 4 mg IV every four hours p.r.n.  2. Labetalol 10 mg IV every six hours p.r.n. systolic blood pressure greater than 160. 3. Chlorhexidine liquid 5 mg orally q.12 hours.   4. Fluticasone HFA 220 mcg 2 puffs q.12. 5. Nystatin cream affected area q.8 hours.  6. Amlodipine 10 mg daily.  7. Albuterol/ipratropium inhaler 4 puffs q.4 hours. 8. Tylenol 650 q.4 p.r.n.  9. Sliding scale insulin. 10. Fentanyl 12.5 to 25 mcg IV q.5 to 10 minutes p.r.n. pain. 11. Lovenox 115 mcg q.12 hours.  12. Coumadin 5 mg daily.  13. Coreg 3.125 mg b.i.d.  14. Klor-Con 20 mEq daily.  15. Ranitidine 150 mg q.12.   REASON FOR ADMISSION: This is a 68 year old female who came in via EMS after an overdose of 50 tablets of 2 mg Valium. She also has a history of sleep apnea. At that point she was very lethargic and was brought into the ER. Patient had altered mental status and was admitted to the Intensive Care Unit for close observation.   HOSPITAL COURSE:  1. Acute respiratory failure. Patient was in the Intensive Care Unit under observation because of her altered mental status from benzodiazepine overdose. On day 20 she became so lethargic it was felt  she could no longer control her airway. She was intubated at that time. 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  2. Altered mental. She has had both altered mental status and altered level of consciousness, thought to be secondary to the benzodiazepine overdose, slow to come around. She is now only getting where she responds to some commands. It is thought to be probably a hypoxic encephalopathy or metabolic encephalopathy but she is showing slow improvement.  3. Left lower extremity cellulitis. This was treated with antibiotics.  4. Coronary artery disease. She was continued on current medications for this.  5. Hypertension. This is controlled with current medications.  6. Non-insulin-dependent diabetes. She has been on insulin with sugars well controlled.  7. Depression with possible suicide attempt. She has been evaluated several times by psychiatry, last being yesterday. At that point she was deemed to be nonsuicidal and the sitter was discontinued.   DISPOSITION: Patient is being transferred to [facility] for long-term acute care and further weaning from the ventilator. She is discharged in stable but serious condition and will undergo further treatment there.   TIME SPENT ON DISCHARGE: 50 minutes with greater than 50% of the time Spent on coordination of care. ____________________________ Gracelyn Nurse, MD jdj:cms D: 04/20/2011 10:05:46 ET T: 04/20/2011 10:36:15 ET  JOB#: [id number] cc: Herbon E. Meredeth Ide, MD Jolanta B. Jennet Maduro, MD Renford Dills, MD Cammy Copa, MD Dory Larsen, MD [facility]  Augusto Gamble Teala Daffron MD ELECTRONICALLY SIGNED 04/23/2011 20:00

## 2014-08-02 NOTE — Op Note (Signed)
PATIENT NAME:  Rachel Brady, Rachel Brady MR#:  [id number] DATE OF BIRTH:  12-Jun-1945  DATE OF PROCEDURE:  04/17/2011  PREOPERATIVE DIAGNOSES:  1. Prolonged intubation.  2. Vent dependence.   POSTOPERATIVE DIAGNOSES: 1. Prolonged intubation.  2. Vent dependence.   OPERATIVE PROCEDURE: Tracheostomy.   SURGEON: Cammy CopaPaul H. Brannon Levene, MD   ANESTHESIA: General.   COMPLICATIONS: None.   TOTAL ESTIMATED BLOOD LOSS: Minimal.   PROCEDURE: The patient was brought to the operating suite. She was already on the ventilator through an oral endotracheal tube that has been there a full two weeks already. The patient was given general anesthesia through the oral endotracheal tube. She was in the supine position with the neck extended some. She had markings placed on the neck for the incision line and then 1% Xylocaine with epinephrine 1:100,000 was used for infiltration. Approximately 4 mL were used. She was prepped and draped in sterile fashion.   An incision was made low anterior neck about two fingerbreadths above the sternal notch. The skin was incised down in the sub-Q. Self-retaining retractor was placed. A core of fatty tissue was removed from between the sub-Q and the strap muscles. Strap muscles were divided in the midline. 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 This was divided in the middle using the Harmonic scalpel. This provided access to the pretracheal fascia. A trach hook was placed underneath the cricoid ring and this stabilized the trach. You could see the first four tracheal rings. The oxygen had been turned down and electrocautery was used along the tissue between the second and third tracheal ring. Scissors were then used to open up into the trachea. This was at the level of the orotracheal tube cuff. Vertical incisions were made laterally on both sides to create an upside down U-shaped flap. 4-0 Vicryl was used to suture this to the underlying tissue of the inferior  flap. This left good opening into the trachea. The orotracheal tube had the cuff deflated and was pulled upward until I could see the entire trachea. Mucous was suctioned from the trachea itself. A #8 Shiley DCT tube had previously been checked for cuff inflation and this was placed. Immediately after it was placed it felt like there was a tear in the cuff. Another trach tube was readied and this was replaced with the trach hook still there and still good visualization. Second one showed good cuff inflation and retained its pressure. The patient was breathing well through this trach tube and her oxygenation was up through the entire time. She had been receiving oxygen through the first trach tube while the second one was readied and then immediately switched over to the second trach tube. The trach tube was then sutured to the skin using 2-0 nylon sutures superiorly. Trach dressing was placed underneath the trach tube to protect the skin and then trach ties were placed around the neck.   The patient tolerated the procedure well. She was awakened and taken back to the ICU in satisfactory condition. There were no operative complications. Total blood loss was minimal.   ____________________________ Cammy CopaPaul H. Katelen Luepke, MD phj:drc D: 04/17/2011 12:56:29 ET T: 04/17/2011 13:45:56 ET JOB#: [id number]  cc: Cammy CopaPaul H. Raysa Bosak, MD, <Dictator> Cammy CopaPAUL H Tavaris Eudy MD ELECTRONICALLY SIGNED 04/20/2011 7:34

## 2014-08-05 LAB — URINE CULTURE

## 2014-08-07 LAB — CULTURE, BLOOD (SINGLE)

## 2014-08-08 LAB — CULTURE, BLOOD (SINGLE)

## 2014-12-13 ENCOUNTER — Encounter: Payer: Self-pay | Admitting: Internal Medicine

## 2014-12-13 ENCOUNTER — Emergency Department: Payer: Medicare Other

## 2014-12-13 ENCOUNTER — Inpatient Hospital Stay
Admission: EM | Admit: 2014-12-13 | Discharge: 2015-01-04 | DRG: 870 | Disposition: A | Discharge status: Skilled Nursing Facility | Payer: Medicare Other | Attending: Internal Medicine | Admitting: Internal Medicine

## 2014-12-13 DIAGNOSIS — G4733 Obstructive sleep apnea (adult) (pediatric): Secondary | ICD-10-CM | POA: Diagnosis present

## 2014-12-13 DIAGNOSIS — Z794 Long term (current) use of insulin: Secondary | ICD-10-CM

## 2014-12-13 DIAGNOSIS — J9621 Acute and chronic respiratory failure with hypoxia: Secondary | ICD-10-CM | POA: Diagnosis present

## 2014-12-13 DIAGNOSIS — R680 Hypothermia, not associated with low environmental temperature: Secondary | ICD-10-CM | POA: Diagnosis present

## 2014-12-13 DIAGNOSIS — Z952 Presence of prosthetic heart valve: Secondary | ICD-10-CM | POA: Diagnosis not present

## 2014-12-13 DIAGNOSIS — Z951 Presence of aortocoronary bypass graft: Secondary | ICD-10-CM

## 2014-12-13 DIAGNOSIS — R0603 Acute respiratory distress: Secondary | ICD-10-CM

## 2014-12-13 DIAGNOSIS — B369 Superficial mycosis, unspecified: Secondary | ICD-10-CM | POA: Diagnosis present

## 2014-12-13 DIAGNOSIS — I248 Other forms of acute ischemic heart disease: Secondary | ICD-10-CM | POA: Diagnosis present

## 2014-12-13 DIAGNOSIS — J151 Pneumonia due to Pseudomonas: Secondary | ICD-10-CM | POA: Diagnosis not present

## 2014-12-13 DIAGNOSIS — Z7902 Long term (current) use of antithrombotics/antiplatelets: Secondary | ICD-10-CM | POA: Diagnosis not present

## 2014-12-13 DIAGNOSIS — L89322 Pressure ulcer of left buttock, stage 2: Secondary | ICD-10-CM | POA: Diagnosis present

## 2014-12-13 DIAGNOSIS — F419 Anxiety disorder, unspecified: Secondary | ICD-10-CM | POA: Diagnosis present

## 2014-12-13 DIAGNOSIS — I482 Chronic atrial fibrillation: Secondary | ICD-10-CM | POA: Diagnosis present

## 2014-12-13 DIAGNOSIS — E1122 Type 2 diabetes mellitus with diabetic chronic kidney disease: Secondary | ICD-10-CM | POA: Diagnosis present

## 2014-12-13 DIAGNOSIS — R6520 Severe sepsis without septic shock: Secondary | ICD-10-CM | POA: Diagnosis present

## 2014-12-13 DIAGNOSIS — J158 Pneumonia due to other specified bacteria: Secondary | ICD-10-CM | POA: Diagnosis not present

## 2014-12-13 DIAGNOSIS — Z8249 Family history of ischemic heart disease and other diseases of the circulatory system: Secondary | ICD-10-CM

## 2014-12-13 DIAGNOSIS — N183 Chronic kidney disease, stage 3 (moderate): Secondary | ICD-10-CM | POA: Diagnosis present

## 2014-12-13 DIAGNOSIS — K219 Gastro-esophageal reflux disease without esophagitis: Secondary | ICD-10-CM | POA: Diagnosis present

## 2014-12-13 DIAGNOSIS — Y95 Nosocomial condition: Secondary | ICD-10-CM | POA: Diagnosis present

## 2014-12-13 DIAGNOSIS — R319 Hematuria, unspecified: Secondary | ICD-10-CM | POA: Diagnosis present

## 2014-12-13 DIAGNOSIS — G9340 Encephalopathy, unspecified: Secondary | ICD-10-CM | POA: Diagnosis present

## 2014-12-13 DIAGNOSIS — Z87891 Personal history of nicotine dependence: Secondary | ICD-10-CM | POA: Diagnosis not present

## 2014-12-13 DIAGNOSIS — J9690 Respiratory failure, unspecified, unspecified whether with hypoxia or hypercapnia: Secondary | ICD-10-CM

## 2014-12-13 DIAGNOSIS — T17890A Other foreign object in other parts of respiratory tract causing asphyxiation, initial encounter: Secondary | ICD-10-CM | POA: Diagnosis present

## 2014-12-13 DIAGNOSIS — Z79899 Other long term (current) drug therapy: Secondary | ICD-10-CM

## 2014-12-13 DIAGNOSIS — I469 Cardiac arrest, cause unspecified: Secondary | ICD-10-CM | POA: Diagnosis not present

## 2014-12-13 DIAGNOSIS — I959 Hypotension, unspecified: Secondary | ICD-10-CM | POA: Diagnosis present

## 2014-12-13 DIAGNOSIS — E669 Obesity, unspecified: Secondary | ICD-10-CM | POA: Diagnosis present

## 2014-12-13 DIAGNOSIS — Z0189 Encounter for other specified special examinations: Secondary | ICD-10-CM

## 2014-12-13 DIAGNOSIS — A4151 Sepsis due to Escherichia coli [E. coli]: Secondary | ICD-10-CM | POA: Diagnosis present

## 2014-12-13 DIAGNOSIS — Z9911 Dependence on respirator [ventilator] status: Secondary | ICD-10-CM | POA: Diagnosis present

## 2014-12-13 DIAGNOSIS — E785 Hyperlipidemia, unspecified: Secondary | ICD-10-CM | POA: Diagnosis present

## 2014-12-13 DIAGNOSIS — Z6833 Body mass index (BMI) 33.0-33.9, adult: Secondary | ICD-10-CM | POA: Diagnosis not present

## 2014-12-13 DIAGNOSIS — J0410 Acute tracheitis without obstruction: Secondary | ICD-10-CM | POA: Diagnosis not present

## 2014-12-13 DIAGNOSIS — Z930 Tracheostomy status: Secondary | ICD-10-CM

## 2014-12-13 DIAGNOSIS — A419 Sepsis, unspecified organism: Secondary | ICD-10-CM | POA: Diagnosis not present

## 2014-12-13 DIAGNOSIS — R0600 Dyspnea, unspecified: Secondary | ICD-10-CM | POA: Diagnosis not present

## 2014-12-13 DIAGNOSIS — J810 Acute pulmonary edema: Secondary | ICD-10-CM | POA: Diagnosis not present

## 2014-12-13 DIAGNOSIS — R4182 Altered mental status, unspecified: Secondary | ICD-10-CM

## 2014-12-13 DIAGNOSIS — F329 Major depressive disorder, single episode, unspecified: Secondary | ICD-10-CM | POA: Diagnosis present

## 2014-12-13 DIAGNOSIS — Z1612 Extended spectrum beta lactamase (ESBL) resistance: Secondary | ICD-10-CM | POA: Diagnosis present

## 2014-12-13 DIAGNOSIS — I2510 Atherosclerotic heart disease of native coronary artery without angina pectoris: Secondary | ICD-10-CM | POA: Diagnosis present

## 2014-12-13 DIAGNOSIS — J189 Pneumonia, unspecified organism: Secondary | ICD-10-CM | POA: Diagnosis not present

## 2014-12-13 DIAGNOSIS — L8990 Pressure ulcer of unspecified site, unspecified stage: Secondary | ICD-10-CM | POA: Diagnosis present

## 2014-12-13 DIAGNOSIS — N390 Urinary tract infection, site not specified: Secondary | ICD-10-CM | POA: Diagnosis present

## 2014-12-13 DIAGNOSIS — J9600 Acute respiratory failure, unspecified whether with hypoxia or hypercapnia: Secondary | ICD-10-CM | POA: Diagnosis not present

## 2014-12-13 DIAGNOSIS — Z95828 Presence of other vascular implants and grafts: Secondary | ICD-10-CM

## 2014-12-13 DIAGNOSIS — Z4659 Encounter for fitting and adjustment of other gastrointestinal appliance and device: Secondary | ICD-10-CM

## 2014-12-13 DIAGNOSIS — Z515 Encounter for palliative care: Secondary | ICD-10-CM | POA: Diagnosis not present

## 2014-12-13 DIAGNOSIS — I129 Hypertensive chronic kidney disease with stage 1 through stage 4 chronic kidney disease, or unspecified chronic kidney disease: Secondary | ICD-10-CM | POA: Diagnosis present

## 2014-12-13 HISTORY — DX: Major depressive disorder, single episode, unspecified: F32.9

## 2014-12-13 HISTORY — DX: Obstructive sleep apnea (adult) (pediatric): G47.33

## 2014-12-13 HISTORY — DX: Tracheostomy status: Z93.0

## 2014-12-13 HISTORY — DX: Chronic kidney disease, unspecified: N18.9

## 2014-12-13 HISTORY — DX: Essential (primary) hypertension: I10

## 2014-12-13 HISTORY — DX: Type 2 diabetes mellitus without complications: E11.9

## 2014-12-13 LAB — URINALYSIS COMPLETE WITH MICROSCOPIC (ARMC ONLY)
BILIRUBIN URINE: NEGATIVE
Glucose, UA: NEGATIVE mg/dL
HGB URINE DIPSTICK: NEGATIVE
Ketones, ur: NEGATIVE mg/dL
Nitrite: POSITIVE — AB
PH: 5 (ref 5.0–8.0)
PROTEIN: 100 mg/dL — AB
Specific Gravity, Urine: 1.011 (ref 1.005–1.030)

## 2014-12-13 LAB — BLOOD GAS, ARTERIAL
Acid-Base Excess: 6.5 mmol/L — ABNORMAL HIGH (ref 0.0–3.0)
Allens test (pass/fail): POSITIVE — AB
Bicarbonate: 35 mEq/L — ABNORMAL HIGH (ref 21.0–28.0)
FIO2: 0.35
MECHVT: 500 mL
O2 Saturation: 94.5 %
PEEP: 5 cmH2O
Patient temperature: 37
RATE: 14 resp/min
pCO2 arterial: 68 mmHg — ABNORMAL HIGH (ref 32.0–48.0)
pH, Arterial: 7.32 — ABNORMAL LOW (ref 7.350–7.450)
pO2, Arterial: 79 mmHg — ABNORMAL LOW (ref 83.0–108.0)

## 2014-12-13 LAB — CBC WITH DIFFERENTIAL/PLATELET
BASOS ABS: 0 10*3/uL (ref 0–0.1)
BASOS PCT: 0 %
EOS ABS: 0.1 10*3/uL (ref 0–0.7)
EOS PCT: 1 %
HEMATOCRIT: 31.7 % — AB (ref 35.0–47.0)
HEMOGLOBIN: 9.7 g/dL — AB (ref 12.0–16.0)
LYMPHS ABS: 0.3 10*3/uL — AB (ref 1.0–3.6)
Lymphocytes Relative: 4 %
MCH: 26.9 pg (ref 26.0–34.0)
MCHC: 30.6 g/dL — ABNORMAL LOW (ref 32.0–36.0)
MCV: 87.7 fL (ref 80.0–100.0)
MONO ABS: 0.3 10*3/uL (ref 0.2–0.9)
Monocytes Relative: 3 %
Neutro Abs: 8.2 10*3/uL — ABNORMAL HIGH (ref 1.4–6.5)
Neutrophils Relative %: 92 %
Platelets: 169 10*3/uL (ref 150–440)
RBC: 3.62 MIL/uL — ABNORMAL LOW (ref 3.80–5.20)
RDW: 19.9 % — ABNORMAL HIGH (ref 11.5–14.5)
WBC: 8.9 10*3/uL (ref 3.6–11.0)

## 2014-12-13 LAB — LACTIC ACID, PLASMA
Lactic Acid, Venous: 1.2 mmol/L (ref 0.5–2.0)
Lactic Acid, Venous: 1.6 mmol/L (ref 0.5–2.0)

## 2014-12-13 LAB — COMPREHENSIVE METABOLIC PANEL
ALT: 87 U/L — AB (ref 14–54)
AST: 32 U/L (ref 15–41)
Albumin: 3.1 g/dL — ABNORMAL LOW (ref 3.5–5.0)
Alkaline Phosphatase: 128 U/L — ABNORMAL HIGH (ref 38–126)
Anion gap: 7 (ref 5–15)
BILIRUBIN TOTAL: 0.5 mg/dL (ref 0.3–1.2)
BUN: 34 mg/dL — AB (ref 6–20)
CHLORIDE: 102 mmol/L (ref 101–111)
CO2: 32 mmol/L (ref 22–32)
CREATININE: 1.26 mg/dL — AB (ref 0.44–1.00)
Calcium: 11.9 mg/dL — ABNORMAL HIGH (ref 8.9–10.3)
GFR calc Af Amer: 50 mL/min — ABNORMAL LOW (ref >60)
GFR, EST NON AFRICAN AMERICAN: 43 mL/min — AB (ref >60)
Glucose, Bld: 180 mg/dL — ABNORMAL HIGH (ref 65–99)
Potassium: 4.9 mmol/L (ref 3.5–5.1)
Sodium: 141 mmol/L (ref 135–145)
Total Protein: 6.8 g/dL (ref 6.5–8.1)

## 2014-12-13 LAB — TROPONIN I: TROPONIN I: 0.03 ng/mL (ref <0.031)

## 2014-12-13 LAB — BRAIN NATRIURETIC PEPTIDE: B Natriuretic Peptide: 766 pg/mL — ABNORMAL HIGH (ref 0.0–100.0)

## 2014-12-13 LAB — GLUCOSE, CAPILLARY
GLUCOSE-CAPILLARY: 101 mg/dL — AB (ref 65–99)
Glucose-Capillary: 108 mg/dL — ABNORMAL HIGH (ref 65–99)

## 2014-12-13 LAB — MRSA PCR SCREENING: MRSA BY PCR: POSITIVE — AB

## 2014-12-13 MED ORDER — ACETAMINOPHEN 650 MG RE SUPP: 650.0000 mg | Freq: Four times a day (QID) | RECTAL | Status: DC | PRN

## 2014-12-13 MED ORDER — PANTOPRAZOLE SODIUM 40 MG IV SOLR
40.0000 mg | INTRAVENOUS | Status: DC
  Administered 2014-12-13 – 2014-12-17 (×5): 40 mg via INTRAVENOUS
  Filled 2014-12-13 (×5): qty 40

## 2014-12-13 MED ORDER — ONDANSETRON HCL 4 MG PO TABS: 4.0000 mg | ORAL_TABLET | Freq: Four times a day (QID) | ORAL | Status: DC | PRN

## 2014-12-13 MED ORDER — PIPERACILLIN-TAZOBACTAM 3.375 G IVPB
3.3750 g | Freq: Three times a day (TID) | INTRAVENOUS | Status: DC
  Administered 2014-12-13 – 2014-12-15 (×6): 3.375 g via INTRAVENOUS
  Filled 2014-12-13 (×10): qty 50

## 2014-12-13 MED ORDER — SENNOSIDES-DOCUSATE SODIUM 8.6-50 MG PO TABS: 1.0000 | ORAL_TABLET | Freq: Every evening | ORAL | Status: DC | PRN

## 2014-12-13 MED ORDER — SODIUM CHLORIDE 0.9 % IV SOLN
INTRAVENOUS | Status: DC
  Administered 2014-12-14 – 2014-12-15 (×4): via INTRAVENOUS
  Administered 2014-12-15: 100 mL/h via INTRAVENOUS

## 2014-12-13 MED ORDER — VANCOMYCIN HCL IN DEXTROSE 1-5 GM/200ML-% IV SOLN
1000.0000 mg | INTRAVENOUS | Status: AC
  Administered 2014-12-13: 1000 mg via INTRAVENOUS
  Filled 2014-12-13: qty 200

## 2014-12-13 MED ORDER — ANTISEPTIC ORAL RINSE SOLUTION (CORINZ)
7.0000 mL | Freq: Four times a day (QID) | OROMUCOSAL | Status: DC
  Administered 2014-12-13 – 2014-12-25 (×38): 7 mL via OROMUCOSAL
  Filled 2014-12-13 (×51): qty 7

## 2014-12-13 MED ORDER — ENOXAPARIN SODIUM 40 MG/0.4ML ~~LOC~~ SOLN: 40.0000 mg | SUBCUTANEOUS | Status: DC

## 2014-12-13 MED ORDER — ESCITALOPRAM OXALATE 10 MG PO TABS
5.0000 mg | ORAL_TABLET | Freq: Every day | ORAL | Status: DC
  Administered 2014-12-14 – 2015-01-04 (×18): 5 mg via ORAL
  Filled 2014-12-13: qty 1
  Filled 2014-12-13: qty 2
  Filled 2014-12-13 (×3): qty 1
  Filled 2014-12-13: qty 2
  Filled 2014-12-13: qty 1
  Filled 2014-12-13 (×2): qty 2
  Filled 2014-12-13: qty 1
  Filled 2014-12-13: qty 2
  Filled 2014-12-13 (×4): qty 1
  Filled 2014-12-13: qty 2
  Filled 2014-12-13: qty 1

## 2014-12-13 MED ORDER — CHLORHEXIDINE GLUCONATE 0.12% ORAL RINSE (MEDLINE KIT)
15.0000 mL | Freq: Two times a day (BID) | OROMUCOSAL | Status: DC
  Administered 2014-12-13 – 2014-12-25 (×24): 15 mL via OROMUCOSAL
  Filled 2014-12-13 (×26): qty 15

## 2014-12-13 MED ORDER — APIXABAN 5 MG PO TABS
5.0000 mg | ORAL_TABLET | Freq: Two times a day (BID) | ORAL | Status: DC
  Administered 2014-12-14 – 2015-01-04 (×31): 5 mg via ORAL
  Filled 2014-12-13 (×31): qty 1

## 2014-12-13 MED ORDER — QUETIAPINE FUMARATE 25 MG PO TABS
50.0000 mg | ORAL_TABLET | Freq: Every day | ORAL | Status: DC
  Administered 2014-12-14 – 2015-01-01 (×11): 50 mg via ORAL
  Filled 2014-12-13 (×12): qty 2

## 2014-12-13 MED ORDER — PIPERACILLIN-TAZOBACTAM 3.375 G IVPB 30 MIN
3.3750 g | Freq: Three times a day (TID) | INTRAVENOUS | Status: DC
  Administered 2014-12-13: 3.375 g via INTRAVENOUS
  Filled 2014-12-13: qty 50

## 2014-12-13 MED ORDER — INSULIN ASPART 100 UNIT/ML ~~LOC~~ SOLN
0.0000 [IU] | Freq: Three times a day (TID) | SUBCUTANEOUS | Status: DC
  Administered 2014-12-15 – 2014-12-21 (×9): 1 [IU] via SUBCUTANEOUS
  Administered 2014-12-22: 2 [IU] via SUBCUTANEOUS
  Administered 2014-12-22: 1 [IU] via SUBCUTANEOUS
  Administered 2014-12-24: 2 [IU] via SUBCUTANEOUS
  Administered 2014-12-26 – 2014-12-27 (×3): 1 [IU] via SUBCUTANEOUS
  Administered 2014-12-27 (×2): 2 [IU] via SUBCUTANEOUS
  Administered 2014-12-30 – 2014-12-31 (×2): 1 [IU] via SUBCUTANEOUS
  Filled 2014-12-13 (×3): qty 1
  Filled 2014-12-13: qty 2
  Filled 2014-12-13: qty 1
  Filled 2014-12-13: qty 2
  Filled 2014-12-13 (×5): qty 1
  Filled 2014-12-13: qty 2
  Filled 2014-12-13 (×6): qty 1
  Filled 2014-12-13 (×2): qty 2

## 2014-12-13 MED ORDER — SODIUM CHLORIDE 0.9 % IV BOLUS (SEPSIS)
1000.0000 mL | INTRAVENOUS | Status: AC
  Administered 2014-12-13: 1000 mL via INTRAVENOUS

## 2014-12-13 MED ORDER — HYDROCODONE-ACETAMINOPHEN 5-325 MG PO TABS
1.0000 | ORAL_TABLET | ORAL | Status: DC | PRN
  Administered 2014-12-20: 2 via ORAL
  Administered 2014-12-21 – 2014-12-22 (×3): 1 via ORAL
  Administered 2014-12-24 (×2): 2 via ORAL
  Filled 2014-12-13: qty 2
  Filled 2014-12-13: qty 1
  Filled 2014-12-13: qty 2
  Filled 2014-12-13 (×2): qty 1
  Filled 2014-12-13: qty 2

## 2014-12-13 MED ORDER — ALUM & MAG HYDROXIDE-SIMETH 200-200-20 MG/5ML PO SUSP: 30.0000 mL | Freq: Four times a day (QID) | ORAL | Status: DC | PRN

## 2014-12-13 MED ORDER — ONDANSETRON HCL 4 MG/2ML IJ SOLN
4.0000 mg | Freq: Four times a day (QID) | INTRAMUSCULAR | Status: DC | PRN
  Administered 2014-12-27: 4 mg via INTRAVENOUS
  Filled 2014-12-13: qty 2

## 2014-12-13 MED ORDER — VANCOMYCIN HCL IN DEXTROSE 1-5 GM/200ML-% IV SOLN
1000.0000 mg | Freq: Two times a day (BID) | INTRAVENOUS | Status: DC
  Administered 2014-12-13 – 2014-12-15 (×4): 1000 mg via INTRAVENOUS
  Filled 2014-12-13 (×6): qty 200

## 2014-12-13 MED ORDER — INSULIN ASPART 100 UNIT/ML ~~LOC~~ SOLN: 0.0000 [IU] | Freq: Every day | SUBCUTANEOUS | Status: DC

## 2014-12-13 MED ORDER — ACETAMINOPHEN 325 MG PO TABS
650.0000 mg | ORAL_TABLET | Freq: Four times a day (QID) | ORAL | Status: DC | PRN
  Administered 2014-12-30: 650 mg via ORAL
  Filled 2014-12-13: qty 2

## 2014-12-13 NOTE — Progress Notes (Signed)
ANTIBIOTIC CONSULT NOTE - INITIAL  Pharmacy Consult for Vancomycin/Zosyn Indication: rule out sepsis  Allergies  Allergen Reactions  . Codeine     Has tolerated oxycodone in the past.    Patient Measurements: Height:  (170.2 cm) Weight: 211 lb (95.709 kg) IBW/kg (Calculated) : 61.6 Adjusted Body Weight: 78kg  Vital Signs: Temp: 93.8 F (34.3 C) (09/04 1000) Temp Source: Core (Comment) (09/04 0943) BP: 81/55 mmHg (09/04 0950) Pulse Rate: 69 (09/04 0943) Intake/Output from previous day:   Intake/Output from this shift:    Labs:  Recent Labs  12/13/14 0925  WBC 8.9  HGB 9.7*  PLT 169  CREATININE 1.26*   Estimated Creatinine Clearance: 50.7 mL/min (by C-G formula based on Cr of 1.26). No results for input(s): VANCOTROUGH, VANCOPEAK, VANCORANDOM, GENTTROUGH, GENTPEAK, GENTRANDOM, TOBRATROUGH, TOBRAPEAK, TOBRARND, AMIKACINPEAK, AMIKACINTROU, AMIKACIN in the last 72 hours.   Microbiology: No results found for this or any previous visit (from the past 720 hour(s)).  Medical History: No past medical history on file.  Medications:  Scheduled:   Infusions:  . piperacillin-tazobactam 3.375 g (12/13/14 1011)  . piperacillin-tazobactam (ZOSYN)  IV    . sodium chloride Stopped (12/13/14 1011)  . vancomycin    . vancomycin     Assessment: Pharmacy consulted to dose vancomycin and Zosyn for 69 yo female admitted with sepsis. Patient ordered Zosyn 3.375g IV x 1 in ED. Patient with positive urinalysis.   Goal of Therapy:  Vancomycin trough level 15-20 mcg/ml  Plan:  1. Vancomycin: will order vancomycin 1 g IV x in ED. Will continue patient on vancomycin 1g IV q12hr for goal trough of 15-20 with next scheduled dose at 1700 on 9/4. Will obtain trough prior to 5th dose of vancomycin.   2. Zosyn: will continue patient on Zosyn EI 3.375g IV q8hr ~ 6hours after initial infusion.   Pharmacy will continue to monitor and adjust per consult.   Simpson,Michael  L 12/13/2014,10:37 AM

## 2014-12-13 NOTE — ED Provider Notes (Signed)
[facility] Emergency Department Provider Note  ____________________________________________  Time seen: Seen upon arrival to the emergency department  I have reviewed the triage vital signs and the nursing notes.   HISTORY  Chief Complaint Respiratory Arrest    HPI Rachel Brady is a 69 y.o. female with a history of a tracheostomy as well as atrial fibrillation who is presenting this morning for altered mental status as well as periods of apnea. The patient was seen last normal at about 1:30 AM by the nursing staff at her skilled nursing facility. However, she was unable to be aroused this morning by nursing staff. EMS transported her here and had to give her several rescue breaths by bag valve mask because of periods of apnea. However, they said she was easy to bag and started breathing spontaneously after several episodes of bagging. They say that she, at her baseline, is able to converse and said no wheelchair. They say she is also able to eat. There was no report of any complaints recently. The patient did have one tab of both Xanax and pain meds last night but there was no report of any overdose.   No past medical history on file.  There are no active problems to display for this patient.   No past surgical history on file.  Current Outpatient Rx  Name  Route  Sig  Dispense  Refill  . apixaban (ELIQUIS) 5 MG TABS tablet   Oral   Take 5 mg by mouth 2 (two) times daily.         . bisacodyl (DULCOLAX) 10 MG suppository   Rectal   Place 10 mg rectally every other day.         . carvedilol (COREG) 6.25 MG tablet   Oral   Take 6.25 mg by mouth 2 (two) times daily. Hold if SBP <105 or HR <60.           Allergies Codeine  No family history on file.  Social History Social History  Substance Use Topics  . Smoking status: Not on file  . Smokeless tobacco: Not on file  . Alcohol Use: Not on file    Review of Systems  Caveat secondary  to patient being nonverbal at this time due to her clinical condition. ____________________________________________   PHYSICAL EXAM:  VITAL SIGNS: ED Triage Vitals  Enc Vitals Group     BP 12/13/14 0920 92/78 mmHg     Pulse Rate 12/13/14 0920 86     Resp 12/13/14 0920 19     Temp 12/13/14 0943 92.4 F (33.6 C)     Temp Source 12/13/14 0943 Core     SpO2 12/13/14 0920 88 %     Weight 12/13/14 0943 211 lb (95.709 kg)     Height 12/13/14 0943  (1.702 m)     Head Cir --      Peak Flow --      Pain Score --      Pain Loc --      Pain Edu? --      Excl. in GC? --     Constitutional: Moves right foot as well as left arm. Does not respond to name her up and I spontaneously. Eyes: Conjunctivae are normal. 3 mm bilaterally and minimally responsive.  Head: Atraumatic. Nose: No congestion/rhinnorhea. Mouth/Throat: Mucous membranes are moist.  Oropharynx non-erythematous. Neck: No stridor. Trachea in place and surrounding tissues are clean dry and intact without any surrounding induration or  erythema.   Cardiovascular: Normal rate, regular rhythm. Grossly normal heart sounds.  Good peripheral circulation. Respiratory: Shallow respirations.  No retractions. Lungs CTAB. Gastrointestinal: Soft and nontender. No distention. No abdominal bruits. No CVA tenderness. Musculoskeletal: No lower extremity tenderness nor edema.  No joint effusions. Neurologic:  Patient minimally responsive to pain with only movement of her left upper extremity and right foot.  Skin:  Skin is warm, dry and intact. No rash noted.   ____________________________________________   LABS (all labs ordered are listed, but only abnormal results are displayed)  Labs Reviewed  COMPREHENSIVE METABOLIC PANEL - Abnormal; Notable for the following:    Glucose, Bld 180 (*)    BUN 34 (*)    Creatinine, Ser 1.26 (*)    Calcium 11.9 (*)    Albumin 3.1 (*)    ALT 87 (*)    Alkaline Phosphatase 128 (*)    GFR calc non  Af Amer 43 (*)    GFR calc Af Amer 50 (*)    All other components within normal limits  CBC WITH DIFFERENTIAL/PLATELET - Abnormal; Notable for the following:    RBC 3.62 (*)    Hemoglobin 9.7 (*)    HCT 31.7 (*)    MCHC 30.6 (*)    RDW 19.9 (*)    Neutro Abs 8.2 (*)    Lymphs Abs 0.3 (*)    All other components within normal limits  BRAIN NATRIURETIC PEPTIDE - Abnormal; Notable for the following:    B Natriuretic Peptide 766.0 (*)    All other components within normal limits  BLOOD GAS, ARTERIAL - Abnormal; Notable for the following:    pH, Arterial 7.32 (*)    pCO2 arterial 68 (*)    pO2, Arterial 79 (*)    Bicarbonate 35.0 (*)    Acid-Base Excess 6.5 (*)    Allens test (pass/fail) POSITIVE (*)    All other components within normal limits  URINALYSIS COMPLETEWITH MICROSCOPIC ([facility] ONLY) - Abnormal; Notable for the following:    Color, Urine YELLOW (*)    APPearance TURBID (*)    Protein, ur 100 (*)    Nitrite POSITIVE (*)    Leukocytes, UA 3+ (*)    Bacteria, UA MANY (*)    Squamous Epithelial / LPF 0-5 (*)    All other components within normal limits  CULTURE, BLOOD (ROUTINE X 2)  CULTURE, BLOOD (ROUTINE X 2)  URINE CULTURE  LACTIC ACID, PLASMA  TROPONIN I  LACTIC ACID, PLASMA   ____________________________________________  EKG  ED ECG REPORT I, Tyneka Scafidi,  Teena Irani, the attending physician, personally viewed and interpreted this ECG.   Date: 12/13/2014  EKG Time: 923  Rate: 73  Rhythm: atrial fibrillation, rate 73  Axis: Left axis  Intervals:right bundle branch block  ST&T Change: T wave inversions in V2 and V3. Poor baseline in V4 through V6. However, there does appear to be a T-wave inversion in V4 but not V5 and V6. There are no ST elevations or depressions. Skin changes in this EKG from that of 04/22/2011. ____________________________________________  RADIOLOGY  Stents left basilar atelectasis or pneumonia. Right mid and lower lung zone pneumonia. I  personally reviewed these films. ____________________________________________   PROCEDURES  CRITICAL CARE Performed by: Arelia Longest   Total critical care time: 45 minutes  Critical care time was exclusive of separately billable procedures and treating other patients.  Critical care was necessary to treat or prevent imminent or life-threatening deterioration.  Critical care was  time spent personally by me on the following activities: development of treatment plan with patient and/or surrogate as well as nursing, discussions with consultants, evaluation of patient's response to treatment, examination of patient, obtaining history from patient or surrogate, ordering and performing treatments and interventions, ordering and review of laboratory studies, ordering and review of radiographic studies, pulse oximetry and re-evaluation of patient's condition.  Sepsis alert was initiated for this patient was initially hypotensive. Her urine was very hazy upon Foley catheterization and she was also found to be hypothermic. Antibiotics were given. The patient was placed on the ventilator because of an initially elevated and tidal CO2 in the 60s. After being placed on the ventilator the patient began to move more of her limbs spontaneously and is now moving all 4 limbs intermittently however not following any commands such as squeeze my hand. ____________________________________________   INITIAL IMPRESSION / ASSESSMENT AND PLAN / ED COURSE  Pertinent labs & imaging results that were available during my care of the patient were reviewed by me and considered in my medical decision making (see chart for details).  ----------------------------------------- 10:38 AM on 12/13/2014 -----------------------------------------  Patient found to have multiple sources of infection including pulmonary as well as urinary. Likely altered mental status from this cause. Placed on the bear hugger as well. Is  not on any chronic steroids per her medicine list from her nursing home. Blood pressure is improving with fluid resuscitation and the last pressure was 91/63. Signed out and admitted to Dr. Tildon Husky of the medicine service. ____________________________________________   FINAL CLINICAL IMPRESSION(S) / ED DIAGNOSES  Acute altered mental status. Acute sepsis secondary to urinary tract infection as well as pneumonia. Initial visit.    Myrna Blazer, MD 12/13/14 [id number]

## 2014-12-13 NOTE — Consult Note (Deleted)
ANTIBIOTIC CONSULT NOTE - INITIAL  Pharmacy Consult for vancomycin/zosyn Indication: rule out sepsis  Allergies  Allergen Reactions  . Codeine     Has tolerated oxycodone in the past.    Patient Measurements: Height:  (170.2 cm) Weight: 211 lb (95.709 kg) IBW/kg (Calculated) : 61.6 Adjusted Body Weight: 50.76  Vital Signs: Temp: 93.8 F (34.3 C) (09/04 1000) Temp Source: Core (Comment) (09/04 0943) BP: 81/55 mmHg (09/04 0950) Pulse Rate: 69 (09/04 0943) Intake/Output from previous day:   Intake/Output from this shift:    Labs:  Recent Labs  12/13/14 0925  WBC 8.9  HGB 9.7*  PLT 169  CREATININE 1.26*   Estimated Creatinine Clearance: 50.7 mL/min (by C-G formula based on Cr of 1.26). No results for input(s): VANCOTROUGH, VANCOPEAK, VANCORANDOM, GENTTROUGH, GENTPEAK, GENTRANDOM, TOBRATROUGH, TOBRAPEAK, TOBRARND, AMIKACINPEAK, AMIKACINTROU, AMIKACIN in the last 72 hours.   Microbiology: No results found for this or any previous visit (from the past 720 hour(s)).  Medical History: No past medical history on file.  Medications:  Scheduled:   Assessment: Pt is a 69 year old female in the ED with possible sepsis. Pharmacy consulted to dose vancomycin and zosyn Ke=0.047 T1/2=14.74 Vd=52.6 Goal of Therapy:  Vancomycin trough level 15-20 mcg/ml  Plan:  Measure antibiotic drug levels at steady state Follow up culture results No vancomycin dose has been given in the ER yet. Will give  once. then start  q 12hour . Zosyn 3.375g q 8 hours EI infusion  Will check trough prior to 5th dose. 9/6 @ 1030  Marilyne Haseley D Viviane Semidey 12/13/2014,10:34 AM

## 2014-12-13 NOTE — Progress Notes (Signed)
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 No stress ulcer prophylaxis ordered.   eICU Interventions  Will order: 1. Protonix 40 mg IV now and Q day.      Intervention Category Intermediate Interventions: Best-practice therapies (e.g. DVT, beta blocker, etc.)  Sommer,Steven Eugene 12/13/2014, 6:24 PM

## 2014-12-13 NOTE — ED Notes (Signed)
Patient's son- [name] called [phone] States he is patient's medical power of attorney.

## 2014-12-13 NOTE — H&P (Signed)
[facility] - [place] at [facility]   PATIENT NAME: Rachel Brady   MR#: [id number]  DATE OF BIRTH: 07/01/1945  DATE OF ADMISSION: 12/13/2014  PRIMARY CARE PHYSICIAN: Dorothey Baseman, MD   REQUESTING/REFERRING PHYSICIAN: Dr Langston Masker CHIEF COMPLAINT:  Sepsis resiratory arrest  HISTORY OF PRESENT ILLNESS:  Rachel Brady is a 69 y.o. female with a known history of tracheostomy, OSA, atrial fibrillation and type 2 diabetes who presents from nursing home with altered mental status and apnea. Patient was apparently seen approximately 1:00 this morning in her usual state of health. This morning when the staff tried to wake her she was unarousable and very lethargic. EMS was then called by the staff. On transportation to the ER it was noted the patient had episodes of apnea. Patient was bagged and started breathing spontaneously. She is currently intubated. She was also found to be hypotensive and hypothermic. Sepsis protocol was ordered. Patient was started on Zosyn and vancomycin. Patient currently has a bear hugger in place. Patient had a urine analysis which shows a urinary tract infection.  PAST MEDICAL HISTORY:   Past Medical History  Diagnosis Date  . Tracheostomy in place   . Diabetes   . CKD (chronic kidney disease)   . OSA (obstructive sleep apnea)   . HTN (hypertension)   . MDD (major depressive disorder)    atrial fibrillation CAD GERD without esophagitis Delusional disorder Hyperlipidemia   PAST SURGICAL HISTORY:  shoulder surgery  coronary artery bypass Aortic valve replacement  tracheostomy SOCIAL HISTORY:  former smoker no alcohol   FAMILY HISTORY:  Cancer  hypertension  DRUG ALLERGIES:   Allergies  Allergen Reactions  . Codeine     Has tolerated oxycodone in the past.     REVIEW OF SYSTEMS:  Patient is sedated on ventilator  MEDICATIONS AT HOME:   Prior  to Admission medications   Medication Sig Start Date End Date Taking? Authorizing Provider  acetaminophen (TYLENOL) 325 MG tablet Take 650 mg by mouth every 6 (six) hours as needed for mild pain, moderate pain or fever.   Yes Historical Provider, MD  ALPRAZolam Prudy Feeler) 0.5 MG tablet Take 0.5 mg by mouth every 8 (eight) hours as needed for anxiety.   Yes Historical Provider, MD  apixaban (ELIQUIS) 5 MG TABS tablet Take 5 mg by mouth 2 (two) times daily. 11/09/14  Yes Historical Provider, MD  atorvastatin (LIPITOR) 80 MG tablet Take 80 mg by mouth at bedtime.   Yes Historical Provider, MD  bisacodyl (DULCOLAX) 10 MG suppository Place 10 mg rectally every other day.   Yes Historical Provider, MD  carvedilol (COREG) 6.25 MG tablet Take 6.25 mg by mouth 2 (two) times daily. Hold if SBP <105 or HR <60.   Yes Historical Provider, MD  escitalopram (LEXAPRO) 5 MG tablet Take 5 mg by mouth daily.   Yes Historical Provider, MD  famotidine (PEPCID) 40 MG tablet Take 40 mg by mouth daily.   Yes Historical Provider, MD  furosemide (LASIX) 40 MG tablet Take 40 mg by mouth daily.   Yes Historical Provider, MD  insulin aspart (NOVOLOG FLEXPEN) 100 UNIT/ML FlexPen Inject into the skin 4 (four) times daily - before meals and at bedtime. Sliding scale: if blood sugar is 151-200=4 units, 201-250=8 units, 251-300=10 units, 301-350=12 units, greater than 351=Call MD   Yes Historical Provider, MD  Insulin Detemir (LEVEMIR FLEXTOUCH) 100 UNIT/ML Pen Inject 10 Units into the skin every morning.   Yes Historical Provider, MD  ipratropium-albuterol (  DUONEB) 0.5-2.5 (3) MG/3ML SOLN Take 3 mLs by nebulization See admin instructions. Use 1 vial via nebulizer every 6 hours (scheduled). Use 1 vial via nebulizer every 2 hours as needed for shortness of breath and/or wheezing.   Yes Historical Provider, MD  isosorbide dinitrate (ISORDIL) 30 MG tablet Take 30  mg by mouth daily.   Yes Historical Provider, MD  megestrol (MEGACE) 400 MG/10ML suspension Take 400 mg by mouth daily.   Yes Historical Provider, MD  Multiple Vitamin (MULTIVITAMIN) tablet Take 1 tablet by mouth daily.   Yes Historical Provider, MD  nystatin cream (MYCOSTATIN) Apply 1 application topically 2 (two) times daily.   Yes Historical Provider, MD  ondansetron (ZOFRAN) 4 MG tablet Take 4 mg by mouth every 6 (six) hours as needed for nausea or vomiting.   Yes Historical Provider, MD  oxyCODONE (OXY IR/ROXICODONE) 5 MG immediate release tablet Take 5 mg by mouth every 6 (six) hours as needed for severe pain.   Yes Historical Provider, MD  POLYETHYLENE GLYCOL 3350 PO Take 17 g by mouth daily. Mix with 4-8 ounces of water.   Yes Historical Provider, MD  potassium chloride SA (K-DUR,KLOR-CON) 20 MEQ tablet Take 40 mEq by mouth daily.   Yes Historical Provider, MD  QUEtiapine (SEROQUEL) 50 MG tablet Take 50 mg by mouth at bedtime.   Yes Historical Provider, MD     VITAL SIGNS:  Blood pressure 81/55, pulse 69, temperature 93.8 F (34.3 C), temperature source Core (Comment), resp. rate 19, height 5\' 7"  (1.702 m), weight 95.709 kg (211 lb), SpO2 91 %.  PHYSICAL EXAMINATION:  GENERAL: 69 y.o.-year-old patient lying in the bed with no acute distress on vent through trach.  EYES: Pupils equal, round, reactive to light and accommodation. No scleral icterus. HEENT: Head atraumatic, normocephalic. Oropharynx clear. poor dentition  NECK: Supple, no jugular venous distention. No thyroid enlargement, no tenderness.  LUNGS: Normal breath sounds bilaterally, no wheezing, rales,rhonchi or crepitation. No use of accessory muscles of respiration.  CARDIOVASCULAR: S1, S2 normal. 3/6 murmurs, no rubs, or gallops.  ABDOMEN: Soft, nontender, nondistended. Bowel sounds present. hard to appreciate organomegaly due to body habitus.   EXTREMITIES: No pedal edema, cyanosis, or clubbing.  NEURO Sedated on vent . PSYCHIATRIC: The patient is Sedated on vent  SKIN: patient has flaky skin under her breasts and under her pannus looks like a fungal infection. Patient lower extremities are tawny   LABORATORY PANEL:   CBC  Last Labs      Recent Labs Lab 12/13/14 0925  WBC 8.9  HGB 9.7*  HCT 31.7*  PLT 169     ------------------------------------------------------------------------------------------------------------------  Chemistries   Last Labs      Recent Labs Lab 12/13/14 0925  NA 141  K 4.9  CL 102  CO2 32  GLUCOSE 180*  BUN 34*  CREATININE 1.26*  CALCIUM 11.9*  AST 32  ALT 87*  ALKPHOS 128*  BILITOT 0.5     ------------------------------------------------------------------------------------------------------------------  Cardiac Enzymes  Last Labs      Recent Labs Lab 12/13/14 0925  TROPONINI 0.03     ------------------------------------------------------------------------------------------------------------------  RADIOLOGY:   Imaging Results (Last 48 hours)    Dg Chest 1 View  12/13/2014 CLINICAL DATA: Respiratory arrest twice this morning. EXAM: CHEST 1 VIEW COMPARISON: 08/02/2014. FINDINGS: Stable enlarged cardiac silhouette and post CABG changes. Tracheostomy tube in satisfactory position. Dense airspace opacity in the lower half of the left lung. Patchy airspace opacity in the right mid and lower lung zones.  Bilateral pleural effusions. Stable prosthetic heart valve and thoracic spine fixation hardware. Thoracic spine degenerative changes. IMPRESSION: 1. Dense left basilar atelectasis or pneumonia. 2. Right mid and lower lung zone pneumonia or alveolar edema. 3. Cardiomegaly and bilateral pleural effusions. Electronically Signed By: Beckie Salts M.D. On: 12/13/2014 10:17     EKG:  atrial fibrillation heart rate 73  no ST elevation or depression    TThhh Th tthtthththld female 69 year old female with a tracheostomy, atrial fibrillation, diabetes         This is a 69 year old female with a history of tracheostomy, atrial fibrillation, diabetes who presents with sepsis.  1. Sepsis with hypotension: This is secondary to urinary tract infection. Patient presents with hypothermia and hypotension. Her urine shows urinary tract infection. She is currently on Zosyn and vancomycin. Her chest x-ray is also concerning for possible pneumonia. I will continue broad spectrum into buttocks. Blood and urine cultures were ordered in the emergency room. Patient will continue on IV fluids and bear hugger. Blood pressure seems to have responded well with IV fluids.  2. Urinary tract infection: Patient is on Zosyn and vancomycin. Follow up on urine culture.  3. Healthcare acquired pneumonia: Patient will continue on Zosyn and vancomycin. Follow up on blood cultures. Chest x-ray is ordered for the a.m.  4. Acute respiratory failure: Patient has a tracheostomy placed. Patient's currently on ventilator. Pulmonary consult has been placed. It appears the patient was having some apneic spells and therefore was intubated. Continue to monitor closely.  5. Atrial fibrillation: Due to low heart rate we will not continue her outpatient medications for heart rate control. Her heart rate is currently controlled. She will continue on telemetry monitoring. She is on anticoagulation as outpatient.  6. Diabetes: Patient is currently nothing by mouth. Patient on signs constant. Hold outpatient medications.  7. Depression/anxiety: If able to give these medications via NG tube we will continue these.  8. Fungal skin infection: I have ordered nystatin.  Patient is full CODE STATUS.  Case discussed with the ER physician.  Critical care time spent 65 minutes.  Patient's high risk of cardiopulmonary arrest.

## 2014-12-13 NOTE — ED Notes (Signed)
Patient presents to the ED via EMS from [facility].  Patient stopped breathing twice with EMS but after they have approx. 5 breaths with bag mask ventilation patient's spontaneous breathing returned.  This occurred twice enroute to hospital.  On arrival patient was hypotensive and responding to pain only.  Patient breathing on her own at this time.

## 2014-12-14 ENCOUNTER — Encounter: Payer: Self-pay | Admitting: *Deleted

## 2014-12-14 ENCOUNTER — Inpatient Hospital Stay: Payer: Medicare Other

## 2014-12-14 DIAGNOSIS — J9600 Acute respiratory failure, unspecified whether with hypoxia or hypercapnia: Secondary | ICD-10-CM

## 2014-12-14 LAB — BASIC METABOLIC PANEL
ANION GAP: 4 — AB (ref 5–15)
BUN: 34 mg/dL — ABNORMAL HIGH (ref 6–20)
CHLORIDE: 107 mmol/L (ref 101–111)
CO2: 29 mmol/L (ref 22–32)
Calcium: 11.2 mg/dL — ABNORMAL HIGH (ref 8.9–10.3)
Creatinine, Ser: 1.41 mg/dL — ABNORMAL HIGH (ref 0.44–1.00)
GFR calc non Af Amer: 37 mL/min — ABNORMAL LOW (ref >60)
GFR, EST AFRICAN AMERICAN: 43 mL/min — AB (ref >60)
Glucose, Bld: 89 mg/dL (ref 65–99)
POTASSIUM: 4.4 mmol/L (ref 3.5–5.1)
SODIUM: 140 mmol/L (ref 135–145)

## 2014-12-14 LAB — CBC
HEMATOCRIT: 27.5 % — AB (ref 35.0–47.0)
Hemoglobin: 8.8 g/dL — ABNORMAL LOW (ref 12.0–16.0)
MCH: 27 pg (ref 26.0–34.0)
MCHC: 32 g/dL (ref 32.0–36.0)
MCV: 84.3 fL (ref 80.0–100.0)
Platelets: 136 10*3/uL — ABNORMAL LOW (ref 150–440)
RBC: 3.26 MIL/uL — AB (ref 3.80–5.20)
RDW: 20.2 % — ABNORMAL HIGH (ref 11.5–14.5)
WBC: 6.9 10*3/uL (ref 3.6–11.0)

## 2014-12-14 LAB — GLUCOSE, CAPILLARY
GLUCOSE-CAPILLARY: 85 mg/dL (ref 65–99)
GLUCOSE-CAPILLARY: 115 mg/dL — AB (ref 65–99)
Glucose-Capillary: 101 mg/dL — ABNORMAL HIGH (ref 65–99)
Glucose-Capillary: 98 mg/dL (ref 65–99)
Glucose-Capillary: 141 mg/dL — ABNORMAL HIGH (ref 65–99)

## 2014-12-14 MED ORDER — PNEUMOCOCCAL VAC POLYVALENT 25 MCG/0.5ML IJ INJ: 0.5000 mL | INJECTION | INTRAMUSCULAR | Status: DC | PRN

## 2014-12-14 MED ORDER — ALPRAZOLAM 0.5 MG PO TABS
0.5000 mg | ORAL_TABLET | Freq: Three times a day (TID) | ORAL | Status: DC | PRN
  Administered 2014-12-14 – 2014-12-17 (×5): 0.5 mg via ORAL
  Filled 2014-12-14 (×6): qty 1

## 2014-12-14 MED ORDER — INFLUENZA VAC SPLIT QUAD 0.5 ML IM SUSY: 0.5000 mL | PREFILLED_SYRINGE | INTRAMUSCULAR | Status: DC | PRN

## 2014-12-14 NOTE — Evaluation (Signed)
[facility] Speaking Valve - Evaluation Patient Details  Name: Rachel Brady MRN: [id number] Date of Birth: 1945/08/14  Today's Date: 12/14/2014 Time: 1030-1115 SLP Time Calculation (min) (ACUTE ONLY): 45 min  Past Medical History:  Past Medical History  Diagnosis Date  . Tracheostomy in place   . Diabetes   . CKD (chronic kidney disease)   . OSA (obstructive sleep apnea)   . HTN (hypertension)   . MDD (major depressive disorder)    Past Surgical History: tracheostomy baseline HPI:  Pt is a 69 y.o. female with a known history of tracheostomy(also current), OSA, MDD, GERD, atrial fibrillation and type 2 diabetes who presents from nursing home with altered mental status and apnea. Patient was apparently seen approximately 1:00 this morning in her usual state of health. This morning when the staff tried to wake her she was unarousable and very lethargic. EMS was then called by the staff. On transportation to the ER it was noted the patient had episodes of apnea. Patient was bagged and started breathing spontaneously. She is currently intubated. She was also found to be hypotensive and hypothermic. Sepsis protocol was ordered. Patient was started on Zosyn and vancomycin. Patient currently has a bear hugger in place. Patient had a urine analysis which shows a urinary tract infection. Pt typically has a cuffless trach in place and uses a speaking valve per her report. She eats a regular diet per report. At admission, pt was placed on vent support briefly overnight but is now back on trach collar O2 support and wanting to eat.    Assessment / Plan / Recommendation Clinical Impression  Pt appeared to present w/ adequate toleration of PMV use/wear. She presented w/ brief need for vent support at admission; now on trach collar O2 support. Pt is minimally anxious and wanting medication she takes for her anxiety. She is aphonic w/out PMV. Cuff deflation checked; vital signs noted. PMV placed easily w/out  discomfort. Pt immediately began talking w/ clear vocal quality and adequate breath support for speech and conversation w/ SLP then NSG. Pt appeared to tolerate the PMV placement for ~30 mins. w/out discomfort and w/out desaturations or change in RR/HR. Education given to pt and NSG on use of the PMV; care of the PMV. Pt will continue to wear the PMV for po trials/BSE post this session. NSG made aware of the need to wear the PMV during meals/meds for swallowing and NOT to sleep w/ PMV placed. ST will f/u w/ toleration of wear/use of PMV.     SLP Assessment  Patient needs continued Speech Lanaguage Pathology Services for toleration of PMV use/wear; education on such   Follow Up Recommendations   (TBD)    Frequency and Duration min 2x/week  1 week   Pertinent Vitals/Pain Denied pain    SLP Goals Potential to Achieve Goals (ACUTE ONLY): Good Potential Considerations (ACUTE ONLY): Previous level of function;Co-morbidities   PMSV Trial  PMSV was placed for: ~30 mins.  Able to redirect subglottic air through upper airway: Yes Able to Attain Phonation: Yes Voice Quality: Normal Able to Expectorate Secretions:  (n/a) Level of Secretion Expectoration with PMSV:  (n/a) Breath Support for Phonation: Adequate Intelligibility: Intelligible Respirations During Trial: 25 SpO2 During Trial: 96 % Pulse During Trial: 98 Behavior: Alert;Anxious;Cooperative;Expresses self well;Good eye contact   Tracheostomy Tube   Shiley #6, cuffed(deflated at baseline)   Vent Dependency  No; trach collar O2 support   Cuff Deflation Trial Tolerated Cuff Deflation: Yes (since off vent support  per RT ) Length of Time for Cuff Deflation Trial: ~1 hr. Behavior: Alert;Anxious;Good eye contact;Expresses self well;Cooperative;Responsive to questions      [name],[name] 12/14/2014, 1:46 PM Jerilynn Som, MS, CCC-SLP

## 2014-12-14 NOTE — Progress Notes (Signed)
Called [facility] , talked with Joni Reining, pt with small red area on bottom assessed at facility, and information obtained, pt had cuffless trach

## 2014-12-14 NOTE — Consult Note (Signed)
[facility] [place] Pulmonary Medicine Consultation      Date: 12/14/2014,   MRN# [id number] Rachel Brady November 22, 1945 Code Status:     Code Status Orders        Start     Ordered   12/13/14 1231  Full code   Continuous     12/13/14 1230     Hosp day:@LENGTHOFSTAYDAYS @ Referring MD: @ATDPROV @     PCP:      AdmissionWeight: 211 lb (95.709 kg)                 CurrentWeight: 212 lb 8.4 oz (96.4 kg)      CHIEF COMPLAINT:  SOb and confusion  SOb  HISTORY OF PRESENT ILLNESS   69 yo white female s/p chronic  Trach seen today for vetn support, patient had increased lethargy and increased WOB last night, trach replaced with #6 shiley Cuffed and placed on ventilator, patient seem this AM, alert and awake, following commands  Blood work and US shows evidence of GPC bacetermia and evidence of UTI   PAST MEDICAL HISTORY   Past Medical History  Diagnosis Date  . Tracheostomy in place   . Diabetes   . CKD (chronic kidney disease)   . OSA (obstructive sleep apnea)   . HTN (hypertension)   . MDD (major depressive disorder)      SURGICAL HISTORY   History reviewed. No pertinent past surgical history.   FAMILY HISTORY   History reviewed. No pertinent family history.   SOCIAL HISTORY   Social History  Substance Use Topics  . Smoking status: Never Smoker   . Smokeless tobacco: None  . Alcohol Use: None     MEDICATIONS    Home Medication:  No current outpatient prescriptions on file.  Current Medication:  Current facility-administered medications:  .  0.9 %  sodium chloride infusion, , Intravenous, Continuous, Adrian Saran, MD, Last Rate: 100 mL/hr at 12/14/14 0802 .  acetaminophen (TYLENOL) tablet 650 mg, 650 mg, Oral, Q6H PRN **OR** acetaminophen (TYLENOL) suppository 650 mg, 650 mg, Rectal, Q6H PRN, Adrian Saran, MD .  alum & mag hydroxide-simeth (MAALOX/MYLANTA) 200-200-20 MG/5ML suspension 30 mL, 30 mL, Oral, Q6H PRN, Adrian Saran, MD .  antiseptic oral rinse  solution (CORINZ), 7 mL, Mouth Rinse, QID, Adrian Saran, MD, 7 mL at 12/14/14 0358 .  apixaban (ELIQUIS) tablet 5 mg, 5 mg, Oral, BID, Adrian Saran, MD, Stopped at 12/13/14 1451 .  chlorhexidine gluconate (PERIDEX) 0.12 % solution 15 mL, 15 mL, Mouth Rinse, BID, Sital Mody, MD, 15 mL at 12/14/14 0750 .  escitalopram (LEXAPRO) tablet 5 mg, 5 mg, Oral, Daily, Adrian Saran, MD, Stopped at 12/13/14 1451 .  HYDROcodone-acetaminophen (NORCO/VICODIN) 5-325 MG per tablet 1-2 tablet, 1-2 tablet, Oral, Q4H PRN, Adrian Saran, MD .  Influenza vac split quadrivalent PF (FLUARIX) injection 0.5 mL, 0.5 mL, Intramuscular, Prior to discharge, Delfino Lovett, MD .  insulin aspart (novoLOG) injection 0-5 Units, 0-5 Units, Subcutaneous, QHS, Adrian Saran, MD, 0 Units at 12/13/14 2149 .  insulin aspart (novoLOG) injection 0-9 Units, 0-9 Units, Subcutaneous, TID WC, Adrian Saran, MD, Stopped at 12/13/14 1827 .  ondansetron (ZOFRAN) tablet 4 mg, 4 mg, Oral, Q6H PRN **OR** ondansetron (ZOFRAN) injection 4 mg, 4 mg, Intravenous, Q6H PRN, Sital Mody, MD .  pantoprazole (PROTONIX) injection 40 mg, 40 mg, Intravenous, Q24H, Karl Ito, MD, 40 mg at 12/13/14 1841 .  piperacillin-tazobactam (ZOSYN) IVPB 3.375 g, 3.375 g, Intravenous, 3 times per day, Bertram Savin, RPH, 3.375  g at 12/14/14 1610 .  pneumococcal 23 valent vaccine (PNU-IMMUNE) injection 0.5 mL, 0.5 mL, Intramuscular, Prior to discharge, Delfino Lovett, MD .  QUEtiapine (SEROQUEL) tablet 50 mg, 50 mg, Oral, QHS, Adrian Saran, MD, 50 mg at 12/13/14 2149 .  senna-docusate (Senokot-S) tablet 1 tablet, 1 tablet, Oral, QHS PRN, Adrian Saran, MD .  vancomycin (VANCOCIN) IVPB 1000 mg/200 mL premix, 1,000 mg, Intravenous, Q12H, Bertram Savin, RPH, 1,000 mg at 12/14/14 0409    ALLERGIES   Codeine     REVIEW OF SYSTEMS   Review of Systems  Constitutional: Negative for fever, chills and weight loss.  Eyes: Negative for blurred vision.  Respiratory: Positive for shortness of  breath. Negative for wheezing.   Cardiovascular: Negative for chest pain.  Gastrointestinal: Negative for nausea, vomiting and abdominal pain.  Genitourinary: Negative for flank pain.  Musculoskeletal: Negative.   Skin: Negative for rash.  Neurological: Negative for tremors, focal weakness and headaches.  Endo/Heme/Allergies: Negative.   Psychiatric/Behavioral: The patient is nervous/anxious.   All other systems reviewed and are negative.    VS: BP 131/67 mmHg  Pulse 79  Temp(Src) 99.7 F (37.6 C) (Core (Comment))  Resp 19  Ht 5\' 7"  (1.702 m)  Wt 212 lb 8.4 oz (96.4 kg)  BMI 33.28 kg/m2  SpO2 100%     PHYSICAL EXAM  Physical Exam  Constitutional: She is oriented to person, place, and time. She appears well-developed and well-nourished. No distress.  S/p trach  HENT:  Head: Normocephalic and atraumatic.  Mouth/Throat: No oropharyngeal exudate.  Eyes: EOM are normal. Pupils are equal, round, and reactive to light. No scleral icterus.  Neck: Normal range of motion. Neck supple.  Cardiovascular: Normal rate, regular rhythm and normal heart sounds.   No murmur heard. Pulmonary/Chest: No stridor. No respiratory distress. She has no wheezes. She has rales.  Abdominal: Soft. Bowel sounds are normal. She exhibits no distension. There is no tenderness. There is no rebound.  Musculoskeletal: Normal range of motion. She exhibits no edema.  Neurological: She is alert and oriented to person, place, and time. She displays normal reflexes. Coordination normal.  Skin: Skin is warm. She is not diaphoretic.  Psychiatric: She has a normal mood and affect.        LABS    Recent Labs     12/13/14  0925  12/14/14  0408  HGB  9.7*  8.8*  HCT  31.7*  27.5*  MCV  87.7  84.3  WBC  8.9  6.9  BUN  34*  34*  CREATININE  1.26*  1.41*  GLUCOSE  180*  89  CALCIUM  11.9*  11.2*  ,    No results for input(s): PH in the last 72 hours.  Invalid input(s): PCO2, PO2, BASEEXCESS,  BASEDEFICITE, TFT    CULTURE RESULTS   Recent Results (from the past 240 hour(s))  Blood Culture (routine x 2)     Status: None (Preliminary result)   Collection Time: 12/13/14  9:25 AM  Result Value Ref Range Status   Specimen Description BLOOD LEFT ARM  Final   Special Requests BOTTLES DRAWN AEROBIC AND ANAEROBIC  Final   Culture  Setup Time   Final    GRAM POSITIVE COCCI IN CLUSTERS ANAEROBIC BOTTLE ONLY CRITICAL RESULT CALLED TO, READ BACK BY AND VERIFIED WITH: LESLIE LEWIS 12/14/2014 0630 [facility] CONFIRMED BY [facility]    Culture   Final    GRAM POSITIVE COCCI IN CLUSTERS ANAEROBIC BOTTLE ONLY IDENTIFICATION TO  FOLLOW    Report Status PENDING  Incomplete  Blood Culture (routine x 2)     Status: None (Preliminary result)   Collection Time: 12/13/14  9:35 AM  Result Value Ref Range Status   Specimen Description BLOOD RIGHT FORARM  Final   Special Requests BOTTLES DRAWN AEROBIC AND ANAEROBIC  Final   Culture NO GROWTH < 24 HOURS  Final   Report Status PENDING  Incomplete  Urine culture     Status: None (Preliminary result)   Collection Time: 12/13/14  9:35 AM  Result Value Ref Range Status   Specimen Description URINE, RANDOM  Final   Special Requests NONE  Final   Culture   Final    >=100,000 COLONIES/mL ESCHERICHIA COLI SUSCEPTIBILITIES TO FOLLOW    Report Status PENDING  Incomplete  MRSA PCR Screening     Status: Abnormal   Collection Time: 12/13/14 12:20 PM  Result Value Ref Range Status   MRSA by PCR POSITIVE (A) NEGATIVE Final    Comment:        The GeneXpert MRSA Assay (FDA approved for NASAL specimens only), is one component of a comprehensive MRSA colonization surveillance program. It is not intended to diagnose MRSA infection nor to guide or monitor treatment for MRSA infections. CRITICAL RESULT CALLED TO, READ BACK BY AND VERIFIED WITH: ADAM SCARBORO @1338  ON 12/13/14 BY HP           IMAGING    Dg Chest 1 View  12/13/2014   CLINICAL DATA:   Respiratory arrest twice this morning.  EXAM: CHEST  1 VIEW  COMPARISON:  08/02/2014.  FINDINGS: Stable enlarged cardiac silhouette and post CABG changes. Tracheostomy tube in satisfactory position. Dense airspace opacity in the lower half of the left lung. Patchy airspace opacity in the right mid and lower lung zones. Bilateral pleural effusions. Stable prosthetic heart valve and thoracic spine fixation hardware. Thoracic spine degenerative changes.  IMPRESSION: 1. Dense left basilar atelectasis or pneumonia. 2. Right mid and lower lung zone pneumonia or alveolar edema. 3. Cardiomegaly and bilateral pleural effusions.   Electronically Signed   By: Beckie Salts M.D.   On: 12/13/2014 10:17   Ct Head Wo Contrast  12/13/2014   CLINICAL DATA:  Patient presents to the ED via EMS from [facility]. Patient stopped breathing twice with EMS but after they have approx. 5 breaths with bag mask ventilation patient's spontaneous breathing returned. This occurred twice enroute to hospital. On arrival patient was hypotensive and responding to pain only. Patient breathing on her own at this time. Eval AMS  EXAM: CT HEAD WITHOUT CONTRAST  TECHNIQUE: Contiguous axial images were obtained from the base of the skull through the vertex without intravenous contrast.  COMPARISON:  04/03/2011  FINDINGS: There is moderate central and cortical atrophy. Periventricular white matter changes are consistent with small vessel disease. There is no intra or extra-axial fluid collection or mass lesion. The basilar cisterns and ventricles have a normal appearance. There is no CT evidence for acute infarction or hemorrhage. Bone windows show no acute findings. There is atherosclerotic calcification of the internal carotid arteries.  IMPRESSION: 1. Atrophy and small vessel disease. 2.  No evidence for acute intracranial abnormality.   Electronically Signed   By: Norva Pavlov M.D.   On: 12/13/2014 10:56   Portable Chest 1 View  12/14/2014    CLINICAL DATA:  69 year old female with respiratory failure, sepsis. Initial encounter.  EXAM: PORTABLE CHEST - 1 VIEW  COMPARISON:  12/13/2014  and earlier.  FINDINGS: Portable AP semi upright view at 0555 hours. Stable tracheostomy tube. Stable severe cardiomegaly. Other mediastinal contours are stable, suggestion of some central pulmonary artery enlargement. Mildly decreased pulmonary vascularity. No pneumothorax. Continued dense retrocardiac opacity. Less veiling opacity at the right lung base. Chronic bilateral lateral rib fractures.  IMPRESSION: 1. Severe cardiomegaly with dense lower lobe collapse or consolidation greater on the left. 2. Mildly decreased pulmonary vascularity and veiling opacity at the right lung base suggesting some improvement in pulmonary edema and right pleural effusion.   Electronically Signed   By: Odessa Fleming M.D.   On: 12/14/2014 07:57      ASSESSMENT/PLAN    69 yo white female with acute resp failure from acute encephalopathy from acute UTI and GPC bacteremia with sepsis  1.wean off vent, place on TCT, wean fio2 as tolerated 2.obtain speech consult 3.follow up cultures-continue current abx  Once taken off vent, will deflate cuff and assess swallow and assess for  nutrion and oral meds to be given   I have personally obtained a history, examined the patient, evaluated laboratory and independently reviewed imaging results, formulated the assessment and plan and placed orders.  The Patient requires high complexity decision making for assessment and support, frequent evaluation and titration of therapies, application of advanced monitoring technologies and extensive interpretation of multiple databases. Time spent with patient 40 minutes.  Patient is satisfied with Plan of action and management.    Lucie Leather, M.D.  [facility]  Medical Director [facility] Medical Director [facility] Cardio-Pulmonary Department

## 2014-12-14 NOTE — Evaluation (Signed)
Clinical/Bedside Swallow Evaluation Patient Details  Name: Rachel Brady MRN: [id number] Date of Birth: 1945/10/17  Today's Date: 12/14/2014 Time: SLP Start Time (ACUTE ONLY): 1130 SLP Stop Time (ACUTE ONLY): 1210 SLP Time Calculation (min) (ACUTE ONLY): 40 min  Past Medical History:  Past Medical History  Diagnosis Date  . Tracheostomy in place   . Diabetes   . CKD (chronic kidney disease)   . OSA (obstructive sleep apnea)   . HTN (hypertension)   . MDD (major depressive disorder)    Past Surgical History: tracheostomy HPI:  Pt is a 69 y.o. female with a known history of tracheostomy(also current), OSA, MDD, GERD, atrial fibrillation and type 2 diabetes who presents from nursing home with altered mental status and apnea. Patient was apparently seen approximately 1:00 this morning in her usual state of health. This morning when the staff tried to wake her she was unarousable and very lethargic. EMS was then called by the staff. On transportation to the ER it was noted the patient had episodes of apnea. Patient was bagged and started breathing spontaneously. She is currently intubated. She was also found to be hypotensive and hypothermic. Sepsis protocol was ordered. Patient was started on Zosyn and vancomycin. Patient currently has a bear hugger in place. Patient had a urine analysis which shows a urinary tract infection. Pt typically has a cuffless trach in place and uses a speaking valve per her report. She eats a regular diet per report. At admission, pt was placed on vent support briefly overnight but is now back on trach collar O2 support and wanting to eat.    Assessment / Plan / Recommendation Clinical Impression  Pt appeared to present w/ adequate toleration of PMV use/wear at the beginning of session for BSE. Pt on trach collar O2 support w/ a Shiely #6 trach w/ cuff deflated and PMV placed. Pt is minimally anxious and just received medication for her anxiety. Pt talking w/ clear  vocal quality and adequate breath support for speech and conversation w/ SLP then NSG. Pt appeared to tolerate trials of thin liquids and purees w/ no overt s/s fo aspiration noted and no change in respiratory status noted during/post po trials. Clear vocal quality noted b/t trials, no further decline in respiratory status - she continued to have min. increased WOB and was given rest breaks b/t trials. Pt took meds whole in puree w/out difficulty. Pt requested some "soft foods" and stated she did not want a pureed diet. Suggested a diet reduced in consistency sec. to her increased respiratory effort and anxiety. Pt agreed to tray it after her medication for her anxiety took affect. Rec. a Dys. 2 diet w/ thin liquids w/ strict aspiration precautions and MUST wear the PMV during meals/meds/po's; meds whole in puree for easier swallowing at this time. NSG consulted re: the aspiration precautions and use/wear of PMV during meals to aid swallowing (must NOT to sleep w/ PMV placed). ST will f/u w/ toleration of diet; education. NSG/pt agreed.     Aspiration Risk  Mild    Diet Recommendation Dysphagia 2 (Fine chop);Thin (moistened)   Medication Administration: Whole meds with puree Compensations: Slow rate;Small sips/bites;Minimize environmental distractions    Other  Recommendations Recommended Consults:  (Dietician) Oral Care Recommendations: Oral care BID;Staff/trained caregiver to provide oral care   Follow Up Recommendations   (TBD)    Frequency and Duration min 3x week  1 week   Pertinent Vitals/Pain denied    SLP Swallow Goals  see  care plan  Swallow Study Prior Functional Status   pt resided at [facility] w/ tracheostomy and using a PMV per her report; eating a regular diet per report.     General Date of Onset: 12/13/14 Other Pertinent Information: Pt is a 69 y.o. female with a known history of tracheostomy(also current), OSA, MDD, GERD, atrial fibrillation and type 2 diabetes who presents  from nursing home with altered mental status and apnea. Patient was apparently seen approximately 1:00 this morning in her usual state of health. This morning when the staff tried to wake her she was unarousable and very lethargic. EMS was then called by the staff. On transportation to the ER it was noted the patient had episodes of apnea. Patient was bagged and started breathing spontaneously. She is currently intubated. She was also found to be hypotensive and hypothermic. Sepsis protocol was ordered. Patient was started on Zosyn and vancomycin. Patient currently has a bear hugger in place. Patient had a urine analysis which shows a urinary tract infection. Pt typically has a cuffless trach in place and uses a speaking valve per her report. She eats a regular diet per report. At admission, pt was placed on vent support briefly overnight but is now back on trach collar O2 support and wanting to eat.  Type of Study: Bedside swallow evaluation Previous Swallow Assessment: post tracheostomy but unsure when Diet Prior to this Study: Regular;Thin liquids (per pt) Temperature Spikes Noted: Yes (99.5;  wbc 6.9) Respiratory Status: Trach (on trach collar O2 support; min. increased WOB ) Trach Size and Type: Deflated;#6;With PMSV in place (Shiley; cuffed) History of Recent Intubation:  (pt has tracheostomy baseline) Behavior/Cognition: Alert;Pleasant mood;Cooperative;Requires cueing (anxiety) Oral Cavity - Dentition: Adequate natural dentition/normal for age Self-Feeding Abilities: Able to feed self;Needs set up;Needs assist Patient Positioning: Upright in bed Baseline Vocal Quality: Normal (once PMV placed) Volitional Cough: Strong Volitional Swallow: Able to elicit    Oral/Motor/Sensory Function Overall Oral Motor/Sensory Function: Appears within functional limits for tasks assessed Labial ROM: Within Functional Limits Labial Symmetry: Within Functional Limits Labial Strength: Within Functional  Limits Lingual ROM: Within Functional Limits Lingual Symmetry: Within Functional Limits Lingual Strength: Within Functional Limits Facial Symmetry: Within Functional Limits Mandible: Within Functional Limits   Ice Chips Ice chips: Within functional limits Presentation: Spoon (fed; 3 trials)   Thin Liquid Thin Liquid: Within functional limits Presentation: Self Fed;Straw (assisted; ~3-4 ozs total)    Nectar Thick Nectar Thick Liquid: Not tested   Honey Thick Honey Thick Liquid: Not tested   Puree Puree: Within functional limits Presentation: Spoon;Self Fed (assisted; 6 trials)   Solid   GO    Solid: Not tested Other Comments: pt requested some "soft foods" and stated she did not want a pureed diet. Suggested a diet reduced in consistency sec. to her increased respiratory effort and anxiety. Pt agreed to tray it after her medication for her anxiety took affect.       Jerilynn Som, MS, CCC-SLP  [name],[name] 12/14/2014,2:12 PM

## 2014-12-14 NOTE — Progress Notes (Signed)
[facility] [place] [facility]   PATIENT NAME: Rachel Brady    MR#:  [id number]  DATE OF BIRTH:  12/24/1945  SUBJECTIVE:  CHIEF COMPLAINT:   Chief Complaint  Patient presents with  . Respiratory Arrest  patient with chronictrach sent over increased lethargy and increased WOB, trach replaced with #6 shiley Cuffed and placed on ventilator, patient much more alert and awake, following commands REVIEW OF SYSTEMS:  Review of Systems  Unable to perform ROS: critical illness   DRUG ALLERGIES:   Allergies  Allergen Reactions  . Codeine     Has tolerated oxycodone in the past.   VITALS:  Blood pressure 133/72, pulse 79, temperature 99.5 F (37.5 C), temperature source Core (Comment), resp. rate 20, height  (1.702 m), weight 96.4 kg (212 lb 8.4 oz), SpO2 100 %. PHYSICAL EXAMINATION:  Physical Exam  Constitutional: She is oriented to person, place, and time and well-developed, well-nourished, and in no distress.  HENT:  Head: Normocephalic and atraumatic.  Trach in place  Eyes: Conjunctivae and EOM are normal. Pupils are equal, round, and reactive to light.  Neck: Normal range of motion. Neck supple. No tracheal deviation present. No thyromegaly present.  Cardiovascular: Normal rate, regular rhythm and normal heart sounds.   Pulmonary/Chest: Effort normal and breath sounds normal. No respiratory distress. She has no wheezes. She exhibits no tenderness.  Abdominal: Soft. Bowel sounds are normal. She exhibits no distension. There is no tenderness.  Musculoskeletal: Normal range of motion.  Neurological: She is alert and oriented to person, place, and time. No cranial nerve deficit.  Skin: Skin is warm and dry. No rash noted.  Psychiatric: Mood and affect normal.   LABORATORY PANEL:   CBC  Recent Labs Lab 12/14/14 0408  WBC 6.9  HGB 8.8*  HCT 27.5*  PLT 136*    ------------------------------------------------------------------------------------------------------------------ Chemistries   Recent Labs Lab 12/13/14 0925 12/14/14 0408  NA 141 140  K 4.9 4.4  CL 102 107  CO2 32 29  GLUCOSE 180* 89  BUN 34* 34*  CREATININE 1.26* 1.41*  CALCIUM 11.9* 11.2*  AST 32  --   ALT 87*  --   ALKPHOS 128*  --   BILITOT 0.5  --    RADIOLOGY:  Portable Chest 1 View  12/14/2014   CLINICAL DATA:  69 year old female with respiratory failure, sepsis. Initial encounter.  EXAM: PORTABLE CHEST - 1 VIEW  COMPARISON:  12/13/2014 and earlier.  FINDINGS: Portable AP semi upright view at 0555 hours. Stable tracheostomy tube. Stable severe cardiomegaly. Other mediastinal contours are stable, suggestion of some central pulmonary artery enlargement. Mildly decreased pulmonary vascularity. No pneumothorax. Continued dense retrocardiac opacity. Less veiling opacity at the right lung base. Chronic bilateral lateral rib fractures.  IMPRESSION: 1. Severe cardiomegaly with dense lower lobe collapse or consolidation greater on the left. 2. Mildly decreased pulmonary vascularity and veiling opacity at the right lung base suggesting some improvement in pulmonary edema and right pleural effusion.   Electronically Signed   By: Odessa Fleming M.D.   On: 12/14/2014 07:57   ASSESSMENT AND PLAN:  This is a 69 year old female with a history of tracheostomy, atrial fibrillation, diabetes who presents with sepsis.  1. Sepsis with hypotension: secondary to urinary tract infection.continue Zosyn and vancomycin. Her chest x-ray is also concerning for possible pneumonia. Blood c/s growing GPC and urine cultures growing e.coli. continue on IV fluids and bear hugger. Blood pressure seems to have responded well with IV fluids.  2. E.coli Urinary tract infection: Continue Zosyn and vancomycin.   3. Healthcare acquired pneumonia: continue on Zosyn and vancomycin.   4. Acute respiratory failure: Pulmo  working on weaning vent, place on TCT, wean fio2 as tolerated  5. Atrial fibrillation: Due to low heart rate we will not continue her outpatient medications for heart rate control. Her heart rate is currently controlled. She will continue on telemetry monitoring. She is on anticoagulation as outpatient.  6. Diabetes: Patient is currently nothing by mouth. Patient on signs constant. Hold outpatient medications.  7. Depression/anxiety: If able to give these medications via NG tube we will continue these.  8. Fungal skin infection: continue nystatin.   All the records are reviewed and case discussed with Care Management/Social Worker. Management plans discussed with the patient, family and they are in agreement.  CODE STATUS: Full code  TOTAL TIME (CRITICAL CARE) TAKING CARE OF THIS PATIENT: 35 minutes.   More than 50% of the time was spent in counseling/coordination of care: YES  POSSIBLE D/C IN 2-3 DAYS, DEPENDING ON CLINICAL CONDITION.   [facility], Levin Dagostino M.D on 12/14/2014 at 12:15 PM  Between 7am to 6pm - Pager - [phone]  After 6pm go to [email or web address] - password [email or web address] [facility]  [facility]  Office  [phone]  CC: Primary care physician; Dorothey Baseman, MD

## 2014-12-14 NOTE — Clinical Social Work Note (Signed)
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  Yes Need for family participation in patient care:  Yes (Comment)  Care giving concerns:  Patient currently has been at [facility] for the past few days for rehab.    Social Worker assessment / plan:  CSW consulted on patient due to being from a facility. Patient currently on ventilator. CSW spoke with patient's son: [name]: [phone] via phone. Patient's son stated that his mother had just gotten to [facility] for rehab after being at [facility]. [name] stated that [facility] was able to wean patient off of the vent. Patient's son stated that he is the full time caregiver for his father and unfortunately cannot come up to visit his mother often. Patient's son states that he is hopeful his mother will be able to come off the ventilator this time but if she is not, he prefers she go to [facility] next time. If she is able to come off the ventilator, he does wish for her to return to [facility].  Employment status:  Disabled (Comment on whether or not currently receiving Disability) Insurance information:  Medicare PT Recommendations:   Not assessed at this time Information / Referral to community resources:     Patient/Family's Response to care:  Patient's son expressed appreciation for CSW call.  Patient/Family's Understanding of and Emotional Response to Diagnosis, Current Treatment, and Prognosis:  Patient's son is hopeful that his mother will come off the ventilator.  Emotional Assessment Appearance:  Appears older than stated age Attitude/Demeanor/Rapport:  Unable to Assess Affect (typically observed):  Unable to Assess Orientation:    Alcohol / Substance use:  Not Applicable Psych involvement (Current and /or in the community):  No (Comment)  Discharge Needs  Concerns to be addressed:  Care Coordination Readmission within the last 30 days:  No Current discharge risk:  None Barriers to Discharge:  No Barriers Identified   York Spaniel, LCSW 12/14/2014, 2:27 PM

## 2014-12-14 NOTE — Care Management Note (Signed)
Case Management Note  Patient Details  Name: Rachel Brady MRN: [id number] Date of Birth: 1945/11/28  Subjective/Objective:   Admitted from [facility]. SW consult in place.                  Action/Plan: Will follow progression and assist as needed  Expected Discharge Date:                  Expected Discharge Plan:  Skilled Nursing Facility  In-House Referral:  Clinical Social Work  Discharge planning Services     Post Acute Care Choice:    Choice offered to:     DME Arranged:    DME Agency:     HH Arranged:    HH Agency:     Status of Service:  In process, will continue to follow  Medicare Important Message Given:    Date Medicare IM Given:    Medicare IM give by:    Date Additional Medicare IM Given:    Additional Medicare Important Message give by:     If discussed at Long Length of Stay Meetings, dates discussed:    Additional Comments:  Marily Memos, RN 12/14/2014, 9:27 AM

## 2014-12-15 ENCOUNTER — Inpatient Hospital Stay: Payer: Medicare Other

## 2014-12-15 DIAGNOSIS — R0603 Acute respiratory distress: Secondary | ICD-10-CM

## 2014-12-15 DIAGNOSIS — L8990 Pressure ulcer of unspecified site, unspecified stage: Secondary | ICD-10-CM | POA: Diagnosis present

## 2014-12-15 LAB — GLUCOSE, CAPILLARY
GLUCOSE-CAPILLARY: 122 mg/dL — AB (ref 65–99)
GLUCOSE-CAPILLARY: 104 mg/dL — AB (ref 65–99)
GLUCOSE-CAPILLARY: 81 mg/dL (ref 65–99)
Glucose-Capillary: 67 mg/dL (ref 65–99)
Glucose-Capillary: 104 mg/dL — ABNORMAL HIGH (ref 65–99)

## 2014-12-15 LAB — CBC
HEMATOCRIT: 33 % — AB (ref 35.0–47.0)
HEMOGLOBIN: 10.2 g/dL — AB (ref 12.0–16.0)
MCH: 27.1 pg (ref 26.0–34.0)
MCHC: 30.7 g/dL — AB (ref 32.0–36.0)
MCV: 88 fL (ref 80.0–100.0)
Platelets: 127 10*3/uL — ABNORMAL LOW (ref 150–440)
RBC: 3.75 MIL/uL — ABNORMAL LOW (ref 3.80–5.20)
RDW: 21.3 % — AB (ref 11.5–14.5)
WBC: 6.4 10*3/uL (ref 3.6–11.0)

## 2014-12-15 LAB — BASIC METABOLIC PANEL
ANION GAP: 6 (ref 5–15)
BUN: 31 mg/dL — AB (ref 6–20)
CHLORIDE: 105 mmol/L (ref 101–111)
CO2: 29 mmol/L (ref 22–32)
Calcium: 11.3 mg/dL — ABNORMAL HIGH (ref 8.9–10.3)
Creatinine, Ser: 1.37 mg/dL — ABNORMAL HIGH (ref 0.44–1.00)
GFR calc Af Amer: 45 mL/min — ABNORMAL LOW (ref >60)
GFR calc non Af Amer: 39 mL/min — ABNORMAL LOW (ref >60)
GLUCOSE: 132 mg/dL — AB (ref 65–99)
POTASSIUM: 4 mmol/L (ref 3.5–5.1)
Sodium: 140 mmol/L (ref 135–145)

## 2014-12-15 LAB — URINE CULTURE: Culture: >=100000

## 2014-12-15 LAB — BLOOD GAS, ARTERIAL
ALLENS TEST (PASS/FAIL): POSITIVE — AB
Acid-Base Excess: 3.1 mmol/L — ABNORMAL HIGH (ref 0.0–3.0)
BICARBONATE: 29.5 meq/L — AB (ref 21.0–28.0)
FIO2: 40
LHR: 16 {breaths}/min
MECHVT: 500 mL
O2 Saturation: 99 %
PEEP/CPAP: 5 cmH2O
Patient temperature: 37
pCO2 arterial: 51 mmHg — ABNORMAL HIGH (ref 32.0–48.0)
pH, Arterial: 7.37 (ref 7.350–7.450)
pO2, Arterial: 135 mmHg — ABNORMAL HIGH (ref 83.0–108.0)

## 2014-12-15 LAB — PHOSPHORUS: PHOSPHORUS: 3.6 mg/dL (ref 2.5–4.6)

## 2014-12-15 LAB — MAGNESIUM: Magnesium: 1.6 mg/dL — ABNORMAL LOW (ref 1.7–2.4)

## 2014-12-15 MED ORDER — NITROGLYCERIN IN D5W 200-5 MCG/ML-% IV SOLN
0.0000 ug/min | INTRAVENOUS | Status: DC
  Administered 2014-12-15: 5 ug/min via INTRAVENOUS
  Filled 2014-12-15: qty 250

## 2014-12-15 MED ORDER — DEXTROSE 5 % IV SOLN
2.0000 g | INTRAVENOUS | Status: DC
  Filled 2014-12-15 (×2): qty 2

## 2014-12-15 MED ORDER — MORPHINE SULFATE (PF) 2 MG/ML IV SOLN
2.0000 mg | INTRAVENOUS | Status: DC | PRN
  Administered 2014-12-15 – 2014-12-23 (×13): 2 mg via INTRAVENOUS
  Filled 2014-12-15 (×13): qty 1

## 2014-12-15 MED ORDER — DEXTROSE 50 % IV SOLN
25.0000 mL | Freq: Once | INTRAVENOUS | Status: AC
  Administered 2014-12-15: 25 mL via INTRAVENOUS

## 2014-12-15 MED ORDER — SODIUM CHLORIDE 0.9 % IV SOLN
1.0000 g | INTRAVENOUS | Status: DC
  Administered 2014-12-15: 1 g via INTRAVENOUS
  Filled 2014-12-15 (×2): qty 1

## 2014-12-15 MED ORDER — DEXTROSE 50 % IV SOLN
INTRAVENOUS | Status: AC
  Administered 2014-12-15: 25 mL
  Filled 2014-12-15: qty 50

## 2014-12-15 MED ORDER — METOPROLOL TARTRATE 1 MG/ML IV SOLN
5.0000 mg | INTRAVENOUS | Status: DC | PRN
  Administered 2014-12-15: 5 mg via INTRAVENOUS
  Filled 2014-12-15: qty 5

## 2014-12-15 MED ORDER — CIPROFLOXACIN IN D5W 400 MG/200ML IV SOLN
400.0000 mg | Freq: Two times a day (BID) | INTRAVENOUS | Status: DC
Start: 2014-12-15 — End: 2014-12-18
  Administered 2014-12-15 – 2014-12-18 (×6): 400 mg via INTRAVENOUS
  Filled 2014-12-15 (×8): qty 200

## 2014-12-15 MED ORDER — MUPIROCIN 2 % EX OINT
1.0000 "application " | TOPICAL_OINTMENT | Freq: Two times a day (BID) | CUTANEOUS | Status: AC
  Administered 2014-12-15 – 2014-12-19 (×10): 1 via NASAL
  Filled 2014-12-15: qty 22

## 2014-12-15 MED ORDER — CHLORHEXIDINE GLUCONATE CLOTH 2 % EX PADS
6.0000 | MEDICATED_PAD | Freq: Every day | CUTANEOUS | Status: AC
  Administered 2014-12-16 – 2014-12-18 (×3): 6 via TOPICAL

## 2014-12-15 MED ORDER — NYSTATIN 100000 UNIT/GM EX POWD
Freq: Three times a day (TID) | CUTANEOUS | Status: DC
  Administered 2014-12-15: 22:00:00 via TOPICAL
  Filled 2014-12-15: qty 15

## 2014-12-15 NOTE — Progress Notes (Signed)
eLink Physician-Brief Progress Note Patient Name: Rachel Brady DOB: May 20, 1945 MRN: [id number]   Date of Service  12/15/2014  HPI/Events of Note  sbp in the in 170s and 180s  eICU Interventions  Metoprolol  IV q3hrs prn sbp>160 mmHg, hold for HR <60 bpm     Intervention Category Intermediate Interventions: Hypertension - evaluation and management  Pedro Oldenburg 12/15/2014, 4:36 PM

## 2014-12-15 NOTE — Care Management Important Message (Signed)
Important Message  Patient Details  Name: Rachel Brady MRN: [id number] Date of Birth: [birth year]   Medicare Important Message Given:  Yes-second notification given    Marily Memos, RN 12/15/2014, 11:01 AM

## 2014-12-15 NOTE — Consult Note (Signed)
[facility] Infectious Disease     Reason for Consult:ESBL E coli UTI and sepsis   Referring Physician: Max Sane Date of Admission:  12/13/2014   Active Problems:   Sepsis   Respiratory distress   Pressure ulcer   HPI: Rachel Brady is a 69 y.o. female with a known history of tracheostomy, OSA, atrial fibrillation and type 2 diabetes who presents from nursing home with altered mental status and apnea. Found to have UTI with ESBL E coli, UA with TNTC WBC and WBC clumps, CXR with possible infiltrate. She was on vanco and zosyn but changed to ertapenem 9/6.  [place] 1/2 with CNS  Past Medical History  Diagnosis Date  . Tracheostomy in place   . Diabetes   . CKD (chronic kidney disease)   . OSA (obstructive sleep apnea)   . HTN (hypertension)   . MDD (major depressive disorder)    History reviewed. No pertinent past surgical history. Social History  Substance Use Topics  . Smoking status: Never Smoker   . Smokeless tobacco: None  . Alcohol Use: None   History reviewed. No pertinent family history.  Allergies:  Allergies  Allergen Reactions  . Codeine     Has tolerated oxycodone in the past.    Current antibiotics: Antibiotics Given (last 72 hours)    Date/Time Action Medication Dose Rate   12/13/14 1735 Given   piperacillin-tazobactam (ZOSYN) IVPB 3.375 g 3.375 g 12.5 mL/hr   12/13/14 1836 Given   vancomycin (VANCOCIN) IVPB 1000 mg/200 mL premix 1,000 mg 200 mL/hr   12/13/14 2255 Given   piperacillin-tazobactam (ZOSYN) IVPB 3.375 g 3.375 g 12.5 mL/hr   12/14/14 0409 Given   vancomycin (VANCOCIN) IVPB 1000 mg/200 mL premix 1,000 mg 200 mL/hr   12/14/14 5916 Given   piperacillin-tazobactam (ZOSYN) IVPB 3.375 g 3.375 g 12.5 mL/hr   12/14/14 1403 Given   piperacillin-tazobactam (ZOSYN) IVPB 3.375 g 3.375 g 12.5 mL/hr   12/14/14 1808 Given   vancomycin (VANCOCIN) IVPB 1000 mg/200 mL premix 1,000 mg 200 mL/hr   12/14/14 2132 Given   piperacillin-tazobactam (ZOSYN)  IVPB 3.375 g 3.375 g 12.5 mL/hr   12/15/14 0520 Given   piperacillin-tazobactam (ZOSYN) IVPB 3.375 g 3.375 g 12.5 mL/hr   12/15/14 0520 Given   vancomycin (VANCOCIN) IVPB 1000 mg/200 mL premix 1,000 mg 200 mL/hr   12/15/14 1356 Given   ertapenem (INVANZ) 1 g in sodium chloride 0.9 % 50 mL IVPB 1 g 100 mL/hr      MEDICATIONS: . antiseptic oral rinse  7 mL Mouth Rinse QID  . apixaban  5 mg Oral BID  . chlorhexidine gluconate  15 mL Mouth Rinse BID  . Chlorhexidine Gluconate Cloth  6 each Topical Q0600  . ertapenem  1 g Intravenous Q24H  . escitalopram  5 mg Oral Daily  . insulin aspart  0-5 Units Subcutaneous QHS  . insulin aspart  0-9 Units Subcutaneous TID WC  . mupirocin ointment  1 application Nasal BID  . pantoprazole (PROTONIX) IV  40 mg Intravenous Q24H  . QUEtiapine  50 mg Oral QHS    Review of Systems - unable to obtain OBJECTIVE: Temp:  [96.8 F (36 C)-99.5 F (37.5 C)] 98.1 F (36.7 C) (09/06 1300) Pulse Rate:  [66-120] 66 (09/06 1300) Resp:  [16-34] 16 (09/06 1300) BP: (108-193)/(63-178) 193/178 mmHg (09/06 1300) SpO2:  [63 %-100 %] 100 % (09/06 1300) FiO2 (%):  [28 %-40 %] 40 % (09/06 0845) Physical Exam  Constitutional:  Trached, sedated  HENT: [place]/AT, PERRLA, no scleral icterus Mouth/Throat: Oropharynx is clear and dry . No oropharyngeal exudate.  Cardiovascular: Normal rate, regular rhythm and normal heart sounds. Exam reveals no gallop and no friction rub.  Pulmonary/Chest: rhonchi bilateralluy Neck = supple, no nuchal rigidity Abdominal: Soft. Bowel sounds are normal.  exhibits no distension. There is no tenderness.  Lymphadenopathy: no cervical adenopathy. No axillary adenopathy Neurological: alert and oriented to person, place, and time.  Skin: Skin is warm and dry. No rash noted. No erythema.  Psychiatric: a normal mood and affect.  behavior is normal.  Foley cath  LABS: Results for orders placed or performed during the hospital encounter of  12/13/14 (from the past 48 hour(s))  Glucose, capillary     Status: Abnormal   Collection Time: 12/13/14  5:31 PM  Result Value Ref Range   Glucose-Capillary 108 (H) 65 - 99 mg/dL  Glucose, capillary     Status: Abnormal   Collection Time: 12/13/14  8:59 PM  Result Value Ref Range   Glucose-Capillary 101 (H) 65 - 99 mg/dL  Basic metabolic panel     Status: Abnormal   Collection Time: 12/14/14  4:08 AM  Result Value Ref Range   Sodium 140 135 - 145 mmol/L   Potassium 4.4 3.5 - 5.1 mmol/L   Chloride 107 101 - 111 mmol/L   CO2 29 22 - 32 mmol/L   Glucose, Bld 89 65 - 99 mg/dL   BUN 34 (H) 6 - 20 mg/dL   Creatinine, Ser 1.41 (H) 0.44 - 1.00 mg/dL   Calcium 11.2 (H) 8.9 - 10.3 mg/dL   GFR calc non Af Amer 37 (L) >60 mL/min   GFR calc Af Amer 43 (L) >60 mL/min    Comment: (NOTE) The eGFR has been calculated using the CKD EPI equation. This calculation has not been validated in all clinical situations. eGFR's persistently <60 mL/min signify possible Chronic Kidney Disease.    Anion gap 4 (L) 5 - 15  CBC     Status: Abnormal   Collection Time: 12/14/14  4:08 AM  Result Value Ref Range   WBC 6.9 3.6 - 11.0 K/uL   RBC 3.26 (L) 3.80 - 5.20 MIL/uL   Hemoglobin 8.8 (L) 12.0 - 16.0 g/dL   HCT 27.5 (L) 35.0 - 47.0 %   MCV 84.3 80.0 - 100.0 fL   MCH 27.0 26.0 - 34.0 pg   MCHC 32.0 32.0 - 36.0 g/dL   RDW 20.2 (H) 11.5 - 14.5 %   Platelets 136 (L) 150 - 440 K/uL  Glucose, capillary     Status: Abnormal   Collection Time: 12/14/14  7:41 AM  Result Value Ref Range   Glucose-Capillary 101 (H) 65 - 99 mg/dL   Comment 1 Document in Chart    Comment 2 Repeat Test   Glucose, capillary     Status: None   Collection Time: 12/14/14 11:06 AM  Result Value Ref Range   Glucose-Capillary 85 65 - 99 mg/dL  Glucose, capillary     Status: None   Collection Time: 12/14/14 11:50 AM  Result Value Ref Range   Glucose-Capillary 98 65 - 99 mg/dL   Comment 1 Notify RN    Comment 2 Document in Chart    Glucose, capillary     Status: Abnormal   Collection Time: 12/14/14  4:28 PM  Result Value Ref Range   Glucose-Capillary 115 (H) 65 - 99 mg/dL  Glucose, capillary     Status:  Abnormal   Collection Time: 12/14/14  9:28 PM  Result Value Ref Range   Glucose-Capillary 141 (H) 65 - 99 mg/dL  Glucose, capillary     Status: Abnormal   Collection Time: 12/15/14  7:34 AM  Result Value Ref Range   Glucose-Capillary 122 (H) 65 - 99 mg/dL  Basic metabolic panel     Status: Abnormal   Collection Time: 12/15/14 10:48 AM  Result Value Ref Range   Sodium 140 135 - 145 mmol/L   Potassium 4.0 3.5 - 5.1 mmol/L   Chloride 105 101 - 111 mmol/L   CO2 29 22 - 32 mmol/L   Glucose, Bld 132 (H) 65 - 99 mg/dL   BUN 31 (H) 6 - 20 mg/dL   Creatinine, Ser 1.37 (H) 0.44 - 1.00 mg/dL   Calcium 11.3 (H) 8.9 - 10.3 mg/dL   GFR calc non Af Amer 39 (L) >60 mL/min   GFR calc Af Amer 45 (L) >60 mL/min    Comment: (NOTE) The eGFR has been calculated using the CKD EPI equation. This calculation has not been validated in all clinical situations. eGFR's persistently <60 mL/min signify possible Chronic Kidney Disease.    Anion gap 6 5 - 15  CBC     Status: Abnormal   Collection Time: 12/15/14 10:48 AM  Result Value Ref Range   WBC 6.4 3.6 - 11.0 K/uL   RBC 3.75 (L) 3.80 - 5.20 MIL/uL   Hemoglobin 10.2 (L) 12.0 - 16.0 g/dL   HCT 33.0 (L) 35.0 - 47.0 %   MCV 88.0 80.0 - 100.0 fL   MCH 27.1 26.0 - 34.0 pg   MCHC 30.7 (L) 32.0 - 36.0 g/dL   RDW 21.3 (H) 11.5 - 14.5 %   Platelets 127 (L) 150 - 440 K/uL  Magnesium     Status: Abnormal   Collection Time: 12/15/14 10:48 AM  Result Value Ref Range   Magnesium 1.6 (L) 1.7 - 2.4 mg/dL  Phosphorus     Status: None   Collection Time: 12/15/14 10:48 AM  Result Value Ref Range   Phosphorus 3.6 2.5 - 4.6 mg/dL  Glucose, capillary     Status: Abnormal   Collection Time: 12/15/14 11:31 AM  Result Value Ref Range   Glucose-Capillary 104 (H) 65 - 99 mg/dL  Blood gas,  arterial     Status: Abnormal   Collection Time: 12/15/14 11:35 AM  Result Value Ref Range   FIO2 40.00    Delivery systems VENTILATOR    Mode PRESSURE REGULATED VOLUME CONTROL    VT 500 mL   LHR 16 resp/min   Peep/cpap 5.0 cm H20   pH, Arterial 7.37 7.350 - 7.450   pCO2 arterial 51 (H) 32.0 - 48.0 mmHg   pO2, Arterial 135 (H) 83.0 - 108.0 mmHg   Bicarbonate 29.5 (H) 21.0 - 28.0 mEq/L   Acid-Base Excess 3.1 (H) 0.0 - 3.0 mmol/L   O2 Saturation 99.0 %   Patient temperature 37.0    Collection site RIGHT RADIAL    Sample type ARTERIAL DRAW    Allens test (pass/fail) POSITIVE (A) PASS   No components found for: ESR, C REACTIVE PROTEIN MICRO: Recent Results (from the past 720 hour(s))  Blood Culture (routine x 2)     Status: None (Preliminary result)   Collection Time: 12/13/14  9:25 AM  Result Value Ref Range Status   Specimen Description BLOOD LEFT ARM  Final   Special Requests BOTTLES DRAWN AEROBIC AND ANAEROBIC 7ML  Final   Culture  Setup Time   Final    GRAM POSITIVE COCCI IN CLUSTERS ANAEROBIC BOTTLE ONLY CRITICAL RESULT CALLED TO, READ BACK BY AND VERIFIED WITH: LESLIE LEWIS 12/14/2014 0630 [place] CONFIRMED BY [facility]    Culture   Final    COAGULASE NEGATIVE STAPHYLOCOCCUS ANAEROBIC BOTTLE ONLY Results consistent with contamination.    Report Status PENDING  Incomplete  Blood Culture (routine x 2)     Status: None (Preliminary result)   Collection Time: 12/13/14  9:35 AM  Result Value Ref Range Status   Specimen Description BLOOD RIGHT FORARM  Final   Special Requests BOTTLES DRAWN AEROBIC AND ANAEROBIC 6ML  Final   Culture NO GROWTH 2 DAYS  Final   Report Status PENDING  Incomplete  Urine culture     Status: None   Collection Time: 12/13/14  9:35 AM  Result Value Ref Range Status   Specimen Description URINE, RANDOM  Final   Special Requests NONE  Final   Culture   Final    >=100,000 COLONIES/mL ESCHERICHIA COLI ESBL-EXTENDED SPECTRUM BETA LACTAMASE-THE ORGANISM IS  RESISTANT TO PENICILLINS, CEPHALOSPORINS AND AZTREONAM ACCORDING TO CLSI M100-S15 VOL.[place]. Results Called to: Kathi Simpers AT 1119 12/15/14 CTJ    Report Status 12/15/2014 FINAL  Final   Organism ID, Bacteria ESCHERICHIA COLI  Final      Susceptibility   Escherichia coli - MIC*    AMPICILLIN >=32 RESISTANT Resistant     CEFTAZIDIME 16 RESISTANT Resistant     CEFAZOLIN >=64 RESISTANT Resistant     CEFTRIAXONE 32 RESISTANT Resistant     GENTAMICIN <=1 SENSITIVE Sensitive     IMIPENEM <=0.25 SENSITIVE Sensitive     TRIMETH/SULFA >=320 RESISTANT Resistant     Extended ESBL POSITIVE Resistant     CIPROFLOXACIN Value in next row Sensitive      SENSITIVE<=0.25    NITROFURANTOIN Value in next row Sensitive      SENSITIVE<=16    PIP/TAZO Value in next row Resistant      RESISTANT>=128    ERTAPENEM Value in next row Sensitive      SENSITIVE<=0.5    * >=100,000 COLONIES/mL ESCHERICHIA COLI  MRSA PCR Screening     Status: Abnormal   Collection Time: 12/13/14 12:20 PM  Result Value Ref Range Status   MRSA by PCR POSITIVE (A) NEGATIVE Final    Comment:        The GeneXpert MRSA Assay (FDA approved for NASAL specimens only), is one component of a comprehensive MRSA colonization surveillance program. It is not intended to diagnose MRSA infection nor to guide or monitor treatment for MRSA infections. CRITICAL RESULT CALLED TO, READ BACK BY AND VERIFIED WITH: ADAM SCARBORO $[email or web address]$  ON 12/13/14 BY [facility]     IMAGING: Dg Chest 1 View  12/13/2014   CLINICAL DATA:  Respiratory arrest twice this morning.  EXAM: CHEST  1 VIEW  COMPARISON:  08/02/2014.  FINDINGS: Stable enlarged cardiac silhouette and post CABG changes. Tracheostomy tube in satisfactory position. Dense airspace opacity in the lower half of the left lung. Patchy airspace opacity in the right mid and lower lung zones. Bilateral pleural effusions. Stable prosthetic heart valve and thoracic spine fixation hardware.  Thoracic spine degenerative changes.  IMPRESSION: 1. Dense left basilar atelectasis or pneumonia. 2. Right mid and lower lung zone pneumonia or alveolar edema. 3. Cardiomegaly and bilateral pleural effusions.   Electronically Signed   By: Claudie Revering M.D.   On: 12/13/2014 10:17  Ct Head Wo Contrast  12/13/2014   CLINICAL DATA:  Patient presents to the ED via EMS from [facility]. Patient stopped breathing twice with EMS but after they have approx. 5 breaths with bag mask ventilation patient's spontaneous breathing returned. This occurred twice enroute to hospital. On arrival patient was hypotensive and responding to pain only. Patient breathing on her own at this time. Eval AMS  EXAM: CT HEAD WITHOUT CONTRAST  TECHNIQUE: Contiguous axial images were obtained from the base of the skull through the vertex without intravenous contrast.  COMPARISON:  04/03/2011  FINDINGS: There is moderate central and cortical atrophy. Periventricular white matter changes are consistent with small vessel disease. There is no intra or extra-axial fluid collection or mass lesion. The basilar cisterns and ventricles have a normal appearance. There is no CT evidence for acute infarction or hemorrhage. Bone windows show no acute findings. There is atherosclerotic calcification of the internal carotid arteries.  IMPRESSION: 1. Atrophy and small vessel disease. 2.  No evidence for acute intracranial abnormality.   Electronically Signed   By: Nolon Nations M.D.   On: 12/13/2014 10:56   Dg Chest Port 1 View  12/15/2014   CLINICAL DATA:  Respiratory distress  EXAM: PORTABLE CHEST - 1 VIEW  COMPARISON:  12/14/2014  FINDINGS: There is a tracheostomy tube in satisfactory position. There is bilateral interstitial and alveolar airspace opacities. There is a trace right pleural effusion. There is a moderate left pleural effusion with associated airspace disease likely reflecting atelectasis. There is prominence of the central pulmonary  vasculature. There is no pneumothorax. There is stable cardiomegaly. There is evidence of prior CABG. There is no acute osseous abnormality.  IMPRESSION: 1. Overall findings most consistent with CHF. 2. Moderate left pleural effusion with associated airspace disease likely reflecting atelectasis.   Electronically Signed   By: Kathreen Devoid   On: 12/15/2014 09:20   Portable Chest 1 View  12/14/2014   CLINICAL DATA:  69 year old female with respiratory failure, sepsis. Initial encounter.  EXAM: PORTABLE CHEST - 1 VIEW  COMPARISON:  12/13/2014 and earlier.  FINDINGS: Portable AP semi upright view at 0555 hours. Stable tracheostomy tube. Stable severe cardiomegaly. Other mediastinal contours are stable, suggestion of some central pulmonary artery enlargement. Mildly decreased pulmonary vascularity. No pneumothorax. Continued dense retrocardiac opacity. Less veiling opacity at the right lung base. Chronic bilateral lateral rib fractures.  IMPRESSION: 1. Severe cardiomegaly with dense lower lobe collapse or consolidation greater on the left. 2. Mildly decreased pulmonary vascularity and veiling opacity at the right lung base suggesting some improvement in pulmonary edema and right pleural effusion.   Electronically Signed   By: Genevie Ann M.D.   On: 12/14/2014 07:57    Assessment:   Rachel Brady is a 69 y.o. female admitted with sepsis from ESBL E colu UTI, possible PNA and  respiratory arrest.  Bcx 1/2 with CNS likely contaminant. Fever curve improving, wbc nml.   Recommendations Change ertapenem to cipro as the E coli was sensitive to this Check trach asp culture.  Thank you very much for allowing me to participate in the care of this patient. Please call with questions.   Cheral Marker. Ola Spurr, MD

## 2014-12-15 NOTE — Progress Notes (Signed)
Paged and spoke with Dr. Delfino Lovett in regards to critical urine culture of ESBL positive in urine. Dr. Sherryll Burger acknowledge this result. Derald Macleod, RN

## 2014-12-15 NOTE — Consult Note (Signed)
[facility] Consultation      Date: 12/15/2014,   MRN# [id number] Rachel Brady 1945/06/08 Code Status:     Code Status Orders        Start     Ordered   12/13/14 1231  Full code   Continuous     12/13/14 1230     Hosp day:$RemoveB'@[email or web address]'jocpfION$ @ Referring MD: $RemoveBefor'@ATDPROV'[email or web address]$ @     PCP:      AdmissionWeight: 211 lb (95.709 kg)                 CurrentWeight: 212 lb 8.4 oz (96.4 kg)      CHIEF COMPLAINT:  SOb and confusion  SOb  HISTORY OF PRESENT ILLNESS    patient had increased lethargy and increased WOB, decreased oxygen sats, placed back on vent, likely mucus plugs  Blood work and US shows evidence of GPC bacetermia and evidence of UTI   PAST MEDICAL HISTORY   Past Medical History  Diagnosis Date  . Tracheostomy in place   . Diabetes   . CKD (chronic kidney disease)   . OSA (obstructive sleep apnea)   . HTN (hypertension)   . MDD (major depressive disorder)      SURGICAL HISTORY   History reviewed. No pertinent past surgical history.   FAMILY HISTORY   History reviewed. No pertinent family history.   SOCIAL HISTORY   Social History  Substance Use Topics  . Smoking status: Never Smoker   . Smokeless tobacco: None  . Alcohol Use: None     MEDICATIONS    Home Medication:  No current outpatient prescriptions on file.  Current Medication:  Current facility-administered medications:  .  0.9 %  sodium chloride infusion, , Intravenous, Continuous, Bettey Costa, MD, Last Rate: 100 mL/hr at 12/15/14 0827 .  acetaminophen (TYLENOL) tablet 650 mg, 650 mg, Oral, Q6H PRN **OR** acetaminophen (TYLENOL) suppository 650 mg, 650 mg, Rectal, Q6H PRN, Bettey Costa, MD .  ALPRAZolam Duanne Moron) tablet 0.5 mg, 0.5 mg, Oral, Q8H PRN, Flora Lipps, MD, 0.5 mg at 12/15/14 0826 .  alum & mag hydroxide-simeth (MAALOX/MYLANTA) 200-200-20 MG/5ML suspension 30 mL, 30 mL, Oral, Q6H PRN, Bettey Costa, MD .  antiseptic oral rinse solution (CORINZ), 7 mL, Mouth  Rinse, QID, Bettey Costa, MD, 7 mL at 12/15/14 0521 .  apixaban (ELIQUIS) tablet 5 mg, 5 mg, Oral, BID, Bettey Costa, MD, 5 mg at 12/14/14 2132 .  chlorhexidine gluconate (PERIDEX) 0.12 % solution 15 mL, 15 mL, Mouth Rinse, BID, Sital Mody, MD, 15 mL at 12/15/14 0830 .  escitalopram (LEXAPRO) tablet 5 mg, 5 mg, Oral, Daily, Bettey Costa, MD, 5 mg at 12/14/14 1142 .  HYDROcodone-acetaminophen (NORCO/VICODIN) 5-325 MG per tablet 1-2 tablet, 1-2 tablet, Oral, Q4H PRN, Bettey Costa, MD .  Influenza vac split quadrivalent PF (FLUARIX) injection 0.5 mL, 0.5 mL, Intramuscular, Prior to discharge, Max Sane, MD .  insulin aspart (novoLOG) injection 0-5 Units, 0-5 Units, Subcutaneous, QHS, Bettey Costa, MD, 0 Units at 12/13/14 2149 .  insulin aspart (novoLOG) injection 0-9 Units, 0-9 Units, Subcutaneous, TID WC, Bettey Costa, MD, 1 Units at 12/15/14 0826 .  ondansetron (ZOFRAN) tablet 4 mg, 4 mg, Oral, Q6H PRN **OR** ondansetron (ZOFRAN) injection 4 mg, 4 mg, Intravenous, Q6H PRN, Bettey Costa, MD .  pantoprazole (PROTONIX) injection 40 mg, 40 mg, Intravenous, Q24H, Anders Simmonds, MD, 40 mg at 12/14/14 1808 .  piperacillin-tazobactam (ZOSYN) IVPB 3.375 g, 3.375 g, Intravenous, 3 times per day, Charlett Nose, RPH, 3.375  g at 12/15/14 0520 .  pneumococcal 23 valent vaccine (PNU-IMMUNE) injection 0.5 mL, 0.5 mL, Intramuscular, Prior to discharge, Max Sane, MD .  QUEtiapine (SEROQUEL) tablet 50 mg, 50 mg, Oral, QHS, Bettey Costa, MD, 50 mg at 12/14/14 2132 .  senna-docusate (Senokot-S) tablet 1 tablet, 1 tablet, Oral, QHS PRN, Bettey Costa, MD .  vancomycin (VANCOCIN) IVPB 1000 mg/200 mL premix, 1,000 mg, Intravenous, Q12H, Charlett Nose, RPH, 1,000 mg at 12/15/14 5993    ALLERGIES   Codeine     REVIEW OF SYSTEMS   Review of Systems  Unable to perform ROS: critical illness  Respiratory: Positive for shortness of breath.   All other systems reviewed and are negative.    VS: BP 143/90 mmHg  Pulse 104   Temp(Src) 98.1 F (36.7 C) (Core (Comment))  Resp 25  Ht $R'5\' 7"'zo$  (1.702 m)  Wt 212 lb 8.4 oz (96.4 kg)  BMI 33.28 kg/m2  SpO2 92%     PHYSICAL EXAM  Physical Exam  Constitutional: She appears distressed.  S/p trach  HENT:  Head: Normocephalic and atraumatic.  Mouth/Throat: No oropharyngeal exudate.  Eyes: EOM are normal. Pupils are equal, round, and reactive to light. No scleral icterus.  Neck: Normal range of motion. Neck supple.  Cardiovascular: Normal rate, regular rhythm and normal heart sounds.   No murmur heard. Pulmonary/Chest: No stridor. She is in respiratory distress. She has no wheezes. She has rales.  Abdominal: Soft. Bowel sounds are normal. She exhibits no distension. There is no tenderness. There is no rebound.  Musculoskeletal: Normal range of motion. She exhibits no edema.  Neurological: She displays normal reflexes. Coordination normal.  Lethargy and confusion  Skin: Skin is warm. She is diaphoretic.  Psychiatric: She has a normal mood and affect.        LABS    Recent Labs     12/13/14  0925  12/14/14  0408  HGB  9.7*  8.8*  HCT  31.7*  27.5*  MCV  87.7  84.3  WBC  8.9  6.9  BUN  34*  34*  CREATININE  1.26*  1.41*  GLUCOSE  180*  89  CALCIUM  11.9*  11.2*  ,    No results for input(s): PH in the last 72 hours.  Invalid input(s): PCO2, PO2, BASEEXCESS, BASEDEFICITE, TFT    CULTURE RESULTS   Recent Results (from the past 240 hour(s))  Blood Culture (routine x 2)     Status: None (Preliminary result)   Collection Time: 12/13/14  9:25 AM  Result Value Ref Range Status   Specimen Description BLOOD LEFT ARM  Final   Special Requests BOTTLES DRAWN AEROBIC AND ANAEROBIC 7ML  Final   Culture  Setup Time   Final    GRAM POSITIVE COCCI IN CLUSTERS ANAEROBIC BOTTLE ONLY CRITICAL RESULT CALLED TO, READ BACK BY AND VERIFIED WITH: LESLIE LEWIS 12/14/2014 0630 [place] CONFIRMED BY [facility]    Culture   Final    GRAM POSITIVE COCCI IN  CLUSTERS ANAEROBIC BOTTLE ONLY IDENTIFICATION TO FOLLOW    Report Status PENDING  Incomplete  Blood Culture (routine x 2)     Status: None (Preliminary result)   Collection Time: 12/13/14  9:35 AM  Result Value Ref Range Status   Specimen Description BLOOD RIGHT FORARM  Final   Special Requests BOTTLES DRAWN AEROBIC AND ANAEROBIC 6ML  Final   Culture NO GROWTH < 24 HOURS  Final   Report Status PENDING  Incomplete  Urine culture  Status: None (Preliminary result)   Collection Time: 12/13/14  9:35 AM  Result Value Ref Range Status   Specimen Description URINE, RANDOM  Final   Special Requests NONE  Final   Culture   Final    >=100,000 COLONIES/mL ESCHERICHIA COLI SUSCEPTIBILITIES TO FOLLOW    Report Status PENDING  Incomplete  MRSA PCR Screening     Status: Abnormal   Collection Time: 12/13/14 12:20 PM  Result Value Ref Range Status   MRSA by PCR POSITIVE (A) NEGATIVE Final    Comment:        The GeneXpert MRSA Assay (FDA approved for NASAL specimens only), is one component of a comprehensive MRSA colonization surveillance program. It is not intended to diagnose MRSA infection nor to guide or monitor treatment for MRSA infections. CRITICAL RESULT CALLED TO, READ BACK BY AND VERIFIED WITH: ADAM SCARBORO $RemoveBefor'@[email or web address]'[email or web address]$  ON 12/13/14 BY [facility]           IMAGING    Dg Chest 1 View  12/13/2014   CLINICAL DATA:  Respiratory arrest twice this morning.  EXAM: CHEST  1 VIEW  COMPARISON:  08/02/2014.  FINDINGS: Stable enlarged cardiac silhouette and post CABG changes. Tracheostomy tube in satisfactory position. Dense airspace opacity in the lower half of the left lung. Patchy airspace opacity in the right mid and lower lung zones. Bilateral pleural effusions. Stable prosthetic heart valve and thoracic spine fixation hardware. Thoracic spine degenerative changes.  IMPRESSION: 1. Dense left basilar atelectasis or pneumonia. 2. Right mid and lower lung zone pneumonia or alveolar edema. 3.  Cardiomegaly and bilateral pleural effusions.   Electronically Signed   By: Claudie Revering M.D.   On: 12/13/2014 10:17   Ct Head Wo Contrast  12/13/2014   CLINICAL DATA:  Patient presents to the ED via EMS from [facility]. Patient stopped breathing twice with EMS but after they have approx. 5 breaths with bag mask ventilation patient's spontaneous breathing returned. This occurred twice enroute to hospital. On arrival patient was hypotensive and responding to pain only. Patient breathing on her own at this time. Eval AMS  EXAM: CT HEAD WITHOUT CONTRAST  TECHNIQUE: Contiguous axial images were obtained from the base of the skull through the vertex without intravenous contrast.  COMPARISON:  04/03/2011  FINDINGS: There is moderate central and cortical atrophy. Periventricular white matter changes are consistent with small vessel disease. There is no intra or extra-axial fluid collection or mass lesion. The basilar cisterns and ventricles have a normal appearance. There is no CT evidence for acute infarction or hemorrhage. Bone windows show no acute findings. There is atherosclerotic calcification of the internal carotid arteries.  IMPRESSION: 1. Atrophy and small vessel disease. 2.  No evidence for acute intracranial abnormality.   Electronically Signed   By: Nolon Nations M.D.   On: 12/13/2014 10:56   Portable Chest 1 View  12/14/2014   CLINICAL DATA:  69 year old female with respiratory failure, sepsis. Initial encounter.  EXAM: PORTABLE CHEST - 1 VIEW  COMPARISON:  12/13/2014 and earlier.  FINDINGS: Portable AP semi upright view at 0555 hours. Stable tracheostomy tube. Stable severe cardiomegaly. Other mediastinal contours are stable, suggestion of some central pulmonary artery enlargement. Mildly decreased pulmonary vascularity. No pneumothorax. Continued dense retrocardiac opacity. Less veiling opacity at the right lung base. Chronic bilateral lateral rib fractures.  IMPRESSION: 1. Severe cardiomegaly  with dense lower lobe collapse or consolidation greater on the left. 2. Mildly decreased pulmonary vascularity and veiling opacity at the right lung base  suggesting some improvement in pulmonary edema and right pleural effusion.   Electronically Signed   By: Genevie Ann M.D.   On: 12/14/2014 07:57   MAJOR EVENTS/TEST RESULTS: Admitted 9/4 for resp failure and sepsis 9/5 placed back on vent likely mucus plugs   INDWELLING DEVICES::  MICRO DATA: MRSA PCR >>+MRSA Urine >> E. coli Blood>> Resp >>  ANTIMICROBIALS:  Zosyn 9/4>> Vancomycin 9/4>>     ASSESSMENT/PLAN    69 yo white female with acute resp failure from acute encephalopathy from acute UTI and GPC bacteremia with sepsis, now with resp failure placed back on vent likley due to mucus plugs-will get CXR andABG  1.Respiratory Failure -continue Full MV support -continue Bronchodilator Therapy -Wean Fio2 and PEEP as tolerated -will perform SAT/SBt when respiratory parameters are met  2.UTI sepsis   Plan to wean off vent as tolerated   I have personally obtained a history, examined the patient, evaluated laboratory and independently reviewed imaging results, formulated the assessment and plan and placed orders.  The Patient requires high complexity decision making for assessment and support, frequent evaluation and titration of therapies, application of advanced monitoring technologies and extensive interpretation of multiple databases. Time spent with patient 35 minutes.    Corrin Parker, M.D.  [facility]  Medical Director [facility] Cardio-Pulmonary Department

## 2014-12-15 NOTE — Progress Notes (Signed)
Initial Nutrition Assessment    INTERVENTION:   Coordination of Care: discussed nutritional poc during ICU rounds, remains NPO, on vent via trach. No NG at present. Per MD Kasa, will reassess in 24 hours. Continue to assess   NUTRITION DIAGNOSIS:   Inadequate oral intake related to acute illness as evidenced by NPO status.  GOAL:   Patient will meet greater than or equal to 90% of their needs  MONITOR:    (Energy Intake, Pulmonary, Digestive System, Anthropometrics, Electrolyte/Renal Profile, Glucose Profile)  REASON FOR ASSESSMENT:    (Pressure ulcer)    ASSESSMENT:    Pt admitted with acute respiratory failure with acute encephalopathy, UTI, GPC bacteremia with sepsis, currently on vent via trach, mucous plug this AM, received zananx and currently sedated  Past Medical History  Diagnosis Date  . Tracheostomy in place   . Diabetes   . CKD (chronic kidney disease)   . OSA (obstructive sleep apnea)   . HTN (hypertension)   . MDD (major depressive disorder)      Diet Order:  Diet NPO time specified   Energy Intake: pt started on Dysphagia II diet yesterday while on trach collar, recorded po intake 30% at dinner. Otherwise pt has been NPO  Food and nutrition related history: unable to assess  Electrolyte and Renal Profile:  Recent Labs Lab 12/13/14 0925 12/14/14 0408 12/15/14 1048  BUN 34* 34* 31*  CREATININE 1.26* 1.41* 1.37*  NA 141 140 140  K 4.9 4.4 4.0  MG  --   --  1.6*  PHOS  --   --  3.6   Glucose Profile:   Recent Labs  12/14/14 2128 12/15/14 0734 12/15/14 1131  GLUCAP 141* 122* 104*   Nutritional Anemia Profile:  CBC Latest Ref Rng 12/15/2014 12/14/2014 12/13/2014  WBC 3.6 - 11.0 K/uL 6.4 6.9 8.9  Hemoglobin 12.0 - 16.0 g/dL 10.2(L) 8.8(L) 9.7(L)  Hematocrit 35.0 - 47.0 % 33.0(L) 27.5(L) 31.7(L)  Platelets 150 - 440 K/uL 127(L) 136(L) 169    Protein Profile:   Recent Labs Lab 12/13/14 0925  ALBUMIN 3.1*   Meds: NS at 100 ml/hr, ss  novolog, senokot  Skin:   (stage II buttock)  Last BM:   +large BM 9/5  Height:   Ht Readings from Last 1 Encounters:  12/13/14  (1.702 m)    Weight:   Wt Readings from Last 1 Encounters:  12/13/14 212 lb 8.4 oz (96.4 kg)   Filed Weights   12/13/14 0943 12/13/14 1205  Weight: 211 lb (95.709 kg) 212 lb 8.4 oz (96.4 kg)    BMI:  Body mass index is 33.28 kg/(m^2).  Estimated Nutritional Needs:   Kcal:  1610-9604 kcals (11-14 kcals/kg)   Protein:  122-153 g (2.0-2.5 g/kg) using IBW 61 kg  Fluid:  1525-1830 mL (25-30 ml/kg)   HIGH Care Level  Romelle Starcher MS, RD, LDN [phone] Pager

## 2014-12-15 NOTE — Progress Notes (Signed)
Speech Therapy Note: reviewed chart notes; consulted NSG re: pt's status. Pt was placed on vent support via trach d/t mucous plugging this morning. Pt will remain in vent support overnight per MD. ST will f/u tomorrow w/ po trials when pt is able to tolerate PMV placement for the po intake. Pt is currently NPO. NSG agreed.

## 2014-12-15 NOTE — Progress Notes (Signed)
Since 0730 Pt's sats have decreased into mid 80's (85-89). Pt repositioned several times. Trach collar turned up to 40% and respiratory called. Discussed with Stacy from respiratory that pt needed suctioning inline trach. Stacy suctioned twice and had thick yellow sputum in catheter. Placed back on trach collar and pt's sats were 89%. Pt coughed twice and then sats started to trend down into the low 80's and then mid 70's while simultaneously Stacy from respiratory was called back to room. Bagged pt and recuffed trach. Pt Heart rate up to 120's Afib. Sats returned to 100% with bagging and place back on vent. Dr. Belia Heman at bedside. Derald Macleod, RN

## 2014-12-15 NOTE — Progress Notes (Signed)
[facility] -  at [facility]   PATIENT NAME: Rachel Brady    MR#:  [id number]  DATE OF BIRTH:  1945/09/01  SUBJECTIVE:  CHIEF COMPLAINT:   Chief Complaint  Patient presents with  . Respiratory Arrest  had hypoxia with oxygen saturation dropping in mid 80s - had mucous plug, required deep suctioning, she was also tachypneic and tachycardic at the same time, better after suctioning, urine growing ESBL positive Escherichia coli REVIEW OF SYSTEMS:  Review of Systems  Unable to perform ROS: critical illness   DRUG ALLERGIES:   Allergies  Allergen Reactions  . Codeine     Has tolerated oxycodone in the past.   VITALS:  Blood pressure 135/67, pulse 74, temperature 98.1 F (36.7 C), temperature source Core (Comment), resp. rate 17, height 5\' 7"  (1.702 m), weight 96.4 kg (212 lb 8.4 oz), SpO2 100 %. PHYSICAL EXAMINATION:  Physical Exam  Constitutional: She is oriented to person, place, and time and well-developed, well-nourished, and in no distress.  HENT:  Head: Normocephalic and atraumatic.  Trach in place  Eyes: Conjunctivae and EOM are normal. Pupils are equal, round, and reactive to light.  Neck: Normal range of motion. Neck supple. No tracheal deviation present. No thyromegaly present.  Cardiovascular: Normal rate, regular rhythm and normal heart sounds.   Pulmonary/Chest: Effort normal and breath sounds normal. No respiratory distress. She has no wheezes. She exhibits no tenderness.  Abdominal: Soft. Bowel sounds are normal. She exhibits no distension. There is no tenderness.  Musculoskeletal: Normal range of motion.  Neurological: She is alert and oriented to person, place, and time. No cranial nerve deficit.  Skin: Skin is warm and dry. No rash noted.  Psychiatric: Mood and affect normal.   LABORATORY PANEL:   CBC  Recent Labs Lab 12/15/14 1048  WBC 6.4  HGB 10.2*  HCT 33.0*  PLT 127*    ------------------------------------------------------------------------------------------------------------------ Chemistries   Recent Labs Lab 12/13/14 0925  12/15/14 1048  NA 141  < > 140  K 4.9  < > 4.0  CL 102  < > 105  CO2 32  < > 29  GLUCOSE 180*  < > 132*  BUN 34*  < > 31*  CREATININE 1.26*  < > 1.37*  CALCIUM 11.9*  < > 11.3*  MG  --   --  1.6*  AST 32  --   --   ALT 87*  --   --   ALKPHOS 128*  --   --   BILITOT 0.5  --   --   < > = values in this interval not displayed. RADIOLOGY:  Dg Chest Port 1 View  12/15/2014   CLINICAL DATA:  Respiratory distress  EXAM: PORTABLE CHEST - 1 VIEW  COMPARISON:  12/14/2014  FINDINGS: There is a tracheostomy tube in satisfactory position. There is bilateral interstitial and alveolar airspace opacities. There is a trace right pleural effusion. There is a moderate left pleural effusion with associated airspace disease likely reflecting atelectasis. There is prominence of the central pulmonary vasculature. There is no pneumothorax. There is stable cardiomegaly. There is evidence of prior CABG. There is no acute osseous abnormality.  IMPRESSION: 1. Overall findings most consistent with CHF. 2. Moderate left pleural effusion with associated airspace disease likely reflecting atelectasis.   Electronically Signed   By: Elige Ko   On: 12/15/2014 09:20   ASSESSMENT AND PLAN:  This is a 69 year old female with a history of tracheostomy, atrial fibrillation, diabetes who  presents with sepsis.  1. Sepsis with hypotension: secondary to urinary tract infection.started IV Invanz Her chest x-ray is also concerning for possible pneumonia. Blood c/s growing coagulase-negative staph which is likely skin contaminant, stop vanco. urine cultures growing ESBL e.coli.  c/s infectious disease. case discussed with Dr. Sampson Goon  2.  ESBL E.coli Urinary tract infection: Continue Invanz  3. Healthcare acquired pneumonia: continue on Invanz. CCM following  4.  Acute respiratory failure: Pulmo working on weaning vent, place on TCT, wean fio2 as tolerated  5. Atrial fibrillation: Her heart rate is well controlled at this time  6. Diabetes: Patient is currently nothing by mouth. Patient on signs constant. Hold outpatient medications.  7. Depression/anxiety: If able to give these medications via NG tube we will continue these.  8. Fungal skin infection: continue nystatin.   All the records are reviewed and case discussed with Care Management/Social Worker. Management plans discussed with the patient, family and they are in agreement.  CODE STATUS: Full code  TOTAL TIME (CRITICAL CARE) TAKING CARE OF THIS PATIENT: 35 minutes.   More than 50% of the time was spent in counseling/coordination of care: YES  POSSIBLE D/C IN 2-3 DAYS, DEPENDING ON CLINICAL CONDITION.   [facility], Jeidy Hoerner M.D on 12/15/2014 at 12:54 PM  Between 7am to 6pm - Pager - [phone]  After 6pm go to [email or web address] - password [email or web address] [facility]  [facility]  Office  [phone]  CC: Primary care physician; Dorothey Baseman, MD

## 2014-12-15 NOTE — Progress Notes (Signed)
Called and spoke with Baxter Hire with [facility] in regards to pt having an elevated blood pressure for several hours now. Tried to give 2 mg of Morphine with the thought that BP elevation was pain related. SBP went from 198 prior to morphine to 178 post morphine injection. Before hour limit was up for morphine, SBP was 191 again. Asked since pt has a history of HTN, could orders be obtained for antihypertensives. Derald Macleod, RN

## 2014-12-16 LAB — GLUCOSE, CAPILLARY
Glucose-Capillary: 70 mg/dL (ref 65–99)
Glucose-Capillary: 144 mg/dL — ABNORMAL HIGH (ref 65–99)
Glucose-Capillary: 127 mg/dL — ABNORMAL HIGH (ref 65–99)
Glucose-Capillary: 86 mg/dL (ref 65–99)

## 2014-12-16 LAB — EXPECTORATED SPUTUM ASSESSMENT W REFEX TO RESP CULTURE

## 2014-12-16 LAB — EXPECTORATED SPUTUM ASSESSMENT W GRAM STAIN, RFLX TO RESP C

## 2014-12-16 MED ORDER — HYDRALAZINE HCL 20 MG/ML IJ SOLN
20.0000 mg | INTRAMUSCULAR | Status: DC | PRN
  Administered 2014-12-16: 20 mg via INTRAVENOUS
  Filled 2014-12-16: qty 1

## 2014-12-16 MED ORDER — DIAZEPAM 5 MG/ML IJ SOLN
5.0000 mg | Freq: Four times a day (QID) | INTRAMUSCULAR | Status: DC | PRN
  Administered 2014-12-17 (×2): 5 mg via INTRAVENOUS
  Filled 2014-12-16 (×2): qty 2

## 2014-12-16 MED ORDER — LORAZEPAM 2 MG/ML IJ SOLN
0.5000 mg | Freq: Four times a day (QID) | INTRAMUSCULAR | Status: DC | PRN
  Administered 2014-12-16 – 2014-12-17 (×2): 0.5 mg via INTRAVENOUS
  Filled 2014-12-16 (×2): qty 1

## 2014-12-16 MED ORDER — DEXTROSE 5 % IV SOLN
INTRAVENOUS | Status: DC
  Administered 2014-12-16 – 2014-12-17 (×2): via INTRAVENOUS

## 2014-12-16 MED ORDER — FUROSEMIDE 10 MG/ML IJ SOLN
40.0000 mg | Freq: Once | INTRAMUSCULAR | Status: AC
  Administered 2014-12-16: 40 mg via INTRAVENOUS
  Filled 2014-12-16: qty 4

## 2014-12-16 NOTE — Care Management Note (Addendum)
Case Management Note  Patient Details  Name: Rachel Brady MRN: [id number] Date of Birth: 12-Mar-1946  Subjective/Objective:  Case discussed in progression. Dr. Belia Heman recommends LTACH. Spoke with son, Sherol Dade and he is in agreement with LTACH. [facility] is not in network with insurance. [facility] has accepted the patient, they are pending insurance authorization.                   Action/Plan: LTACH  Expected Discharge Date:                  Expected Discharge Plan:  Skilled Nursing Facility  In-House Referral:  Clinical Social Work  Discharge planning Services     Post Acute Care Choice:    Choice offered to:     DME Arranged:    DME Agency:     HH Arranged:    HH Agency:     Status of Service:  In process, will continue to follow  Medicare Important Message Given:  Yes-second notification given Date Medicare IM Given:    Medicare IM give by:    Date Additional Medicare IM Given:    Additional Medicare Important Message give by:     If discussed at Long Length of Stay Meetings, dates discussed:    Additional Comments:  Marily Memos, RN 12/16/2014, 1:58 PM

## 2014-12-16 NOTE — Progress Notes (Signed)
Paged and spoke with Dr. Delfino Lovett in regards to pt's finger stick glucose continuing to decline. Orders received to start D5 at 60 cc while pt is NPO. Derald Macleod, RN

## 2014-12-16 NOTE — Progress Notes (Signed)
[facility] - [place] at [facility]   PATIENT NAME: Tanica Gaige    MR#:  [id number]  DATE OF BIRTH:  06/06/1945  SUBJECTIVE:  CHIEF COMPLAINT:   Chief Complaint  Patient presents with  . Respiratory Arrest  earlier was on trach collar when I saw her. But per nursing she had another episode of hypoxia and mucus plugs - back on vent  REVIEW OF SYSTEMS:  Review of Systems  Unable to perform ROS: critical illness   DRUG ALLERGIES:   Allergies  Allergen Reactions  . Codeine     Has tolerated oxycodone in the past.   VITALS:  Blood pressure 128/81, pulse 79, temperature 98.1 F (36.7 C), temperature source Core (Comment), resp. rate 18, height 5\' 7"  (1.702 m), weight 96.4 kg (212 lb 8.4 oz), SpO2 100 %. PHYSICAL EXAMINATION:  Physical Exam  Constitutional: She is oriented to person, place, and time and well-developed, well-nourished, and in no distress.  HENT:  Head: Normocephalic and atraumatic.  Trach in place  Eyes: Conjunctivae and EOM are normal. Pupils are equal, round, and reactive to light.  Neck: Normal range of motion. Neck supple. No tracheal deviation present. No thyromegaly present.  Cardiovascular: Normal rate, regular rhythm and normal heart sounds.   Pulmonary/Chest: Effort normal and breath sounds normal. No respiratory distress. She has no wheezes. She exhibits no tenderness.  Abdominal: Soft. Bowel sounds are normal. She exhibits no distension. There is no tenderness.  Musculoskeletal: Normal range of motion.  Neurological: She is alert and oriented to person, place, and time. No cranial nerve deficit.  Skin: Skin is warm and dry. No rash noted.  Psychiatric: Mood and affect normal.   LABORATORY PANEL:   CBC  Recent Labs Lab 12/15/14 1048  WBC 6.4  HGB 10.2*  HCT 33.0*  PLT 127*   ------------------------------------------------------------------------------------------------------------------ Chemistries   Recent  Labs Lab 12/13/14 0925  12/15/14 1048  NA 141  < > 140  K 4.9  < > 4.0  CL 102  < > 105  CO2 32  < > 29  GLUCOSE 180*  < > 132*  BUN 34*  < > 31*  CREATININE 1.26*  < > 1.37*  CALCIUM 11.9*  < > 11.3*  MG  --   --  1.6*  AST 32  --   --   ALT 87*  --   --   ALKPHOS 128*  --   --   BILITOT 0.5  --   --   < > = values in this interval not displayed. RADIOLOGY:  No results found. ASSESSMENT AND PLAN:  This is a 69 year old female with a history of tracheostomy, atrial fibrillation, diabetes who presents with sepsis.  1. Sepsis with hypotension: secondary to urinary tract infection. Appreciate ID input. Cipro should be good for now.  2.  ESBL E.coli Urinary tract infection: Cipro for now.   3. Healthcare acquired pneumonia: on cipro. CCM & ID following  4. Acute respiratory failure: Pulmo working on weaning vent, place on TCT, wean fio2 as tolerated  5. Atrial fibrillation: Her heart rate is well controlled at this time  6. Diabetes: Patient is currently nothing by mouth. Patient on signs constant. Hold outpatient medications.  7. Depression/anxiety: If able to give these medications via NG tube we will continue these.  8. Fungal skin infection: continue nystatin.   All the records are reviewed and case discussed with Care Management/Social Worker. Management plans discussed with the patient, family and  they are in agreement.  CODE STATUS: Full code  TOTAL TIME (CRITICAL CARE) TAKING CARE OF THIS PATIENT: 35 minutes.   More than 50% of the time was spent in counseling/coordination of care: YES  POSSIBLE D/C IN 2-3 DAYS, DEPENDING ON CLINICAL CONDITION.   [facility], Tynlee Bayle M.D on 12/16/2014 at 2:30 PM  Between 7am to 6pm - Pager - [phone]  After 6pm go to [email or web address] - password [email or web address] [facility]  [facility]  Office  [phone]  CC: Primary care physician; Dorothey Baseman, MD

## 2014-12-16 NOTE — Progress Notes (Signed)
Went into the room to reposition the patient per her request. Pt was diaphoretic and temperature was decreasing. After reposition, O2 sats were 91%. Pt stated she needed to be suctioned. After sterile suctioning pt, sats were 94%. Pt began desating approx. 78%. Began bagging pt with ambu bag. Sat's returned to 100%, then pt placed back on ventilator by Amy with respiratory therapy. Derald Macleod, RN

## 2014-12-16 NOTE — Progress Notes (Signed)
At 1045 while on a 40% trach collar her Sa02 dropped to 76%. The RN bagged her while I turned the vent on. At 1050 she was placed back on the vent on previous settings. The Sa02 was 100%.

## 2014-12-16 NOTE — Progress Notes (Signed)
Nutrition Follow-up  INTERVENTION:   Coordination of Care: discussed nutritional poc, per Brayton El RN, MD Kasa wanting to wait on NG placement with initiation of feedings today, will reassess tomorrow. If unable to wean from vent by tomorrow, recommend initation of EN  NUTRITION DIAGNOSIS:   Inadequate oral intake related to acute illness as evidenced by NPO status. Continues  GOAL:   Patient will meet greater than or equal to 90% of their needs   MONITOR:    (Energy Intake, Pulmonary, Digestive System, Anthropometrics, Electrolyte/Renal Profile, Glucose Profile)   ASSESSMENT:    Pt on trach collar this AM, did not tolerate, back on vent support via trach  Diet Order:  Diet NPO time specified  Skin:   (stage II buttock)  Urine volume: UOP 2542 mL  Electrolyte and Renal Profile:  Recent Labs Lab 12/13/14 0925 12/14/14 0408 12/15/14 1048  BUN 34* 34* 31*  CREATININE 1.26* 1.41* 1.37*  NA 141 140 140  K 4.9 4.4 4.0  MG  --   --  1.6*  PHOS  --   --  3.6   Glucose Profile:  Recent Labs  12/15/14 2251 12/16/14 0743 12/16/14 1127  GLUCAP 104* 70 144*   Protein Profile:  Recent Labs Lab 12/13/14 0925  ALBUMIN 3.1*   Meds: NS at 100 ml/hr, D5 at 60 ml/hr, lasix, ss novolog   Height:   Ht Readings from Last 1 Encounters:  12/13/14  (1.702 m)    Weight: no new weight  Wt Readings from Last 1 Encounters:  12/13/14 212 lb 8.4 oz (96.4 kg)    BMI:  Body mass index is 33.28 kg/(m^2).  Estimated Nutritional Needs:   Kcal:  4098-1191 kcals (11-14 kcals/kg)   Protein:  122-153 g (2.0-2.5 g/kg) using IBW 61 kg  Fluid:  1525-1830 mL (25-30 ml/kg)   HIGH Care Level  Romelle Starcher MS, RD, LDN [phone] Pager

## 2014-12-16 NOTE — Consult Note (Signed)
[facility] [place] Pulmonary Medicine Consultation      Date: 12/16/2014,   MRN# [id number] Rachel Brady 12-02-1945 Code Status:     Code Status Orders        Start     Ordered   12/13/14 1231  Full code   Continuous     12/13/14 1230     Hosp day:@LENGTHOFSTAYDAYS @ Referring MD: @ATDPROV @     PCP:      AdmissionWeight: 211 lb (95.709 kg)                 CurrentWeight: 212 lb 8.4 oz (96.4 kg)      CHIEF COMPLAINT:  SOb and confusion  SOb  HISTORY OF PRESENT ILLNESS   Patient more alert and awake this AM, will place back on TCT today, wean fio2 as tolerated Xanax as needed   PAST MEDICAL HISTORY   Past Medical History  Diagnosis Date  . Tracheostomy in place   . Diabetes   . CKD (chronic kidney disease)   . OSA (obstructive sleep apnea)   . HTN (hypertension)   . MDD (major depressive disorder)      SURGICAL HISTORY   History reviewed. No pertinent past surgical history.   FAMILY HISTORY   History reviewed. No pertinent family history.   SOCIAL HISTORY   Social History  Substance Use Topics  . Smoking status: Never Smoker   . Smokeless tobacco: None  . Alcohol Use: None     MEDICATIONS    Home Medication:  No current outpatient prescriptions on file.  Current Medication:  Current facility-administered medications:  .  0.9 %  sodium chloride infusion, , Intravenous, Continuous, Sital Mody, MD, Last Rate: 100 mL/hr at 12/16/14 0600 .  acetaminophen (TYLENOL) tablet 650 mg, 650 mg, Oral, Q6H PRN **OR** acetaminophen (TYLENOL) suppository 650 mg, 650 mg, Rectal, Q6H PRN, Adrian Saran, MD .  ALPRAZolam Prudy Feeler) tablet 0.5 mg, 0.5 mg, Oral, Q8H PRN, Erin Fulling, MD, 0.5 mg at 12/15/14 0826 .  alum & mag hydroxide-simeth (MAALOX/MYLANTA) 200-200-20 MG/5ML suspension 30 mL, 30 mL, Oral, Q6H PRN, Adrian Saran, MD .  antiseptic oral rinse solution (CORINZ), 7 mL, Mouth Rinse, QID, Sital Mody, MD, 7 mL at 12/16/14 0400 .  apixaban (ELIQUIS) tablet 5  mg, 5 mg, Oral, BID, Adrian Saran, MD, Stopped at 12/15/14 7829 .  chlorhexidine gluconate (PERIDEX) 0.12 % solution 15 mL, 15 mL, Mouth Rinse, BID, Adrian Saran, MD, 15 mL at 12/16/14 0823 .  Chlorhexidine Gluconate Cloth 2 % PADS 6 each, 6 each, Topical, Q0600, Delfino Lovett, MD, 6 each at 12/16/14 0600 .  ciprofloxacin (CIPRO) IVPB 400 mg, 400 mg, Intravenous, Q12H, Clydie Braun, MD, 400 mg at 12/16/14 0235 .  dextrose 5 % solution, , Intravenous, Continuous, Delfino Lovett, MD, Last Rate: 60 mL/hr at 12/16/14 0810 .  escitalopram (LEXAPRO) tablet 5 mg, 5 mg, Oral, Daily, Adrian Saran, MD, Stopped at 12/15/14 0925 .  HYDROcodone-acetaminophen (NORCO/VICODIN) 5-325 MG per tablet 1-2 tablet, 1-2 tablet, Oral, Q4H PRN, Adrian Saran, MD .  Influenza vac split quadrivalent PF (FLUARIX) injection 0.5 mL, 0.5 mL, Intramuscular, Prior to discharge, Delfino Lovett, MD .  insulin aspart (novoLOG) injection 0-5 Units, 0-5 Units, Subcutaneous, QHS, Adrian Saran, MD, 0 Units at 12/13/14 2149 .  insulin aspart (novoLOG) injection 0-9 Units, 0-9 Units, Subcutaneous, TID WC, Adrian Saran, MD, 1 Units at 12/15/14 0826 .  metoprolol (LOPRESSOR) injection 5 mg, 5 mg, Intravenous, Q3H PRN, Stephanie Acre, MD, 5 mg at 12/15/14  1703 .  morphine 2 MG/ML injection 2 mg, 2 mg, Intravenous, Q1H PRN, Erin Fulling, MD, 2 mg at 12/16/14 0544 .  mupirocin ointment (BACTROBAN) 2 % 1 application, 1 application, Nasal, BID, Delfino Lovett, MD, 1 application at 12/15/14 2217 .  nitroGLYCERIN 50 mg in dextrose 5 % 250 mL (0.2 mg/mL) infusion, 0-200 mcg/min, Intravenous, Titrated, Vishal Mungal, MD, Last Rate: 6 mL/hr at 12/16/14 0600, 20 mcg/min at 12/16/14 0600 .  nystatin (MYCOSTATIN/NYSTOP) topical powder, , Topical, TID, Sital Mody, MD .  ondansetron (ZOFRAN) tablet 4 mg, 4 mg, Oral, Q6H PRN **OR** ondansetron (ZOFRAN) injection 4 mg, 4 mg, Intravenous, Q6H PRN, Adrian Saran, MD .  pantoprazole (PROTONIX) injection 40 mg, 40 mg, Intravenous, Q24H, Karl Ito, MD, 40 mg at 12/15/14 1808 .  pneumococcal 23 valent vaccine (PNU-IMMUNE) injection 0.5 mL, 0.5 mL, Intramuscular, Prior to discharge, Delfino Lovett, MD .  QUEtiapine (SEROQUEL) tablet 50 mg, 50 mg, Oral, QHS, Adrian Saran, MD, 50 mg at 12/14/14 2132 .  senna-docusate (Senokot-S) tablet 1 tablet, 1 tablet, Oral, QHS PRN, Adrian Saran, MD    ALLERGIES   Codeine     REVIEW OF SYSTEMS   Review of Systems  Constitutional: Positive for malaise/fatigue.  Eyes: Negative.   Respiratory: Positive for shortness of breath.   Cardiovascular: Negative.   Gastrointestinal: Negative.   Musculoskeletal: Negative.   Neurological: Negative for headaches.  Psychiatric/Behavioral: The patient is nervous/anxious.   All other systems reviewed and are negative.    VS: BP 144/118 mmHg  Pulse 84  Temp(Src) 99.5 F (37.5 C) (Core (Comment))  Resp 18  Ht 5\' 7"  (1.702 m)  Wt 212 lb 8.4 oz (96.4 kg)  BMI 33.28 kg/m2  SpO2 100%     PHYSICAL EXAM  Physical Exam  Constitutional: She appears distressed.  S/p trach  HENT:  Head: Normocephalic and atraumatic.  Mouth/Throat: No oropharyngeal exudate.  Eyes: EOM are normal. Pupils are equal, round, and reactive to light. No scleral icterus.  Neck: Normal range of motion. Neck supple.  Cardiovascular: Normal rate, regular rhythm and normal heart sounds.   No murmur heard. Pulmonary/Chest: No stridor. No respiratory distress. She has no wheezes. She has rales.  Abdominal: Soft. Bowel sounds are normal. She exhibits no distension. There is no tenderness. There is no rebound.  Musculoskeletal: Normal range of motion. She exhibits no edema.  Neurological: She displays normal reflexes. Coordination normal.  Lethargy and confusion  Skin: Skin is warm. She is not diaphoretic.  Psychiatric: She has a normal mood and affect.        LABS    Recent Labs     12/13/14  0925  12/14/14  0408  12/15/14  1048  HGB  9.7*  8.8*  10.2*  HCT  31.7*   27.5*  33.0*  MCV  87.7  84.3  88.0  WBC  8.9  6.9  6.4  BUN  34*  34*  31*  CREATININE  1.26*  1.41*  1.37*  GLUCOSE  180*  89  132*  CALCIUM  11.9*  11.2*  11.3*  ,    No results for input(s): PH in the last 72 hours.  Invalid input(s): PCO2, PO2, BASEEXCESS, BASEDEFICITE, TFT    CULTURE RESULTS   Recent Results (from the past 240 hour(s))  Blood Culture (routine x 2)     Status: None (Preliminary result)   Collection Time: 12/13/14  9:25 AM  Result Value Ref Range Status   Specimen Description  BLOOD LEFT ARM  Final   Special Requests BOTTLES DRAWN AEROBIC AND ANAEROBIC  Final   Culture  Setup Time   Final    GRAM POSITIVE COCCI IN CLUSTERS ANAEROBIC BOTTLE ONLY CRITICAL RESULT CALLED TO, READ BACK BY AND VERIFIED WITH: LESLIE LEWIS 12/14/2014 0630 [facility] CONFIRMED BY [facility]    Culture   Final    COAGULASE NEGATIVE STAPHYLOCOCCUS ANAEROBIC BOTTLE ONLY Results consistent with contamination.    Report Status PENDING  Incomplete  Blood Culture (routine x 2)     Status: None (Preliminary result)   Collection Time: 12/13/14  9:35 AM  Result Value Ref Range Status   Specimen Description BLOOD RIGHT FORARM  Final   Special Requests BOTTLES DRAWN AEROBIC AND ANAEROBIC  Final   Culture NO GROWTH 2 DAYS  Final   Report Status PENDING  Incomplete  Urine culture     Status: None   Collection Time: 12/13/14  9:35 AM  Result Value Ref Range Status   Specimen Description URINE, RANDOM  Final   Special Requests NONE  Final   Culture   Final    >=100,000 COLONIES/mL ESCHERICHIA COLI ESBL-EXTENDED SPECTRUM BETA LACTAMASE-THE ORGANISM IS RESISTANT TO PENICILLINS, CEPHALOSPORINS AND AZTREONAM ACCORDING TO CLSI M100-S15 VOL.25 N01 JAN 2005. Results Called to: Derald Macleod AT 1119 12/15/14 CTJ    Report Status 12/15/2014 FINAL  Final   Organism ID, Bacteria ESCHERICHIA COLI  Final      Susceptibility   Escherichia coli - MIC*    AMPICILLIN >=32 RESISTANT Resistant      CEFTAZIDIME 16 RESISTANT Resistant     CEFAZOLIN >=64 RESISTANT Resistant     CEFTRIAXONE 32 RESISTANT Resistant     GENTAMICIN <=1 SENSITIVE Sensitive     IMIPENEM <=0.25 SENSITIVE Sensitive     TRIMETH/SULFA >=320 RESISTANT Resistant     Extended ESBL POSITIVE Resistant     CIPROFLOXACIN Value in next row Sensitive      SENSITIVE<=0.25    NITROFURANTOIN Value in next row Sensitive      SENSITIVE<=16    PIP/TAZO Value in next row Resistant      RESISTANT>=128    ERTAPENEM Value in next row Sensitive      SENSITIVE<=0.5    * >=100,000 COLONIES/mL ESCHERICHIA COLI  MRSA PCR Screening     Status: Abnormal   Collection Time: 12/13/14 12:20 PM  Result Value Ref Range Status   MRSA by PCR POSITIVE (A) NEGATIVE Final    Comment:        The GeneXpert MRSA Assay (FDA approved for NASAL specimens only), is one component of a comprehensive MRSA colonization surveillance program. It is not intended to diagnose MRSA infection nor to guide or monitor treatment for MRSA infections. CRITICAL RESULT CALLED TO, READ BACK BY AND VERIFIED WITH: ADAM SCARBORO @1338  ON 12/13/14 BY HP   Culture, expectorated sputum-assessment     Status: None (Preliminary result)   Collection Time: 12/15/14 10:49 AM  Result Value Ref Range Status   Specimen Description ENDOTRACHEAL  Final   Special Requests NONE  Final   Sputum evaluation THIS SPECIMEN IS ACCEPTABLE FOR SPUTUM CULTURE  Final   Report Status PENDING  Incomplete          IMAGING    Dg Chest 1 View  12/13/2014   CLINICAL DATA:  Respiratory arrest twice this morning.  EXAM: CHEST  1 VIEW  COMPARISON:  08/02/2014.  FINDINGS: Stable enlarged cardiac silhouette and post CABG changes. Tracheostomy tube in  satisfactory position. Dense airspace opacity in the lower half of the left lung. Patchy airspace opacity in the right mid and lower lung zones. Bilateral pleural effusions. Stable prosthetic heart valve and thoracic spine fixation hardware.  Thoracic spine degenerative changes.  IMPRESSION: 1. Dense left basilar atelectasis or pneumonia. 2. Right mid and lower lung zone pneumonia or alveolar edema. 3. Cardiomegaly and bilateral pleural effusions.   Electronically Signed   By: Beckie Salts M.D.   On: 12/13/2014 10:17   Ct Head Wo Contrast  12/13/2014   CLINICAL DATA:  Patient presents to the ED via EMS from [facility]. Patient stopped breathing twice with EMS but after they have approx. 5 breaths with bag mask ventilation patient's spontaneous breathing returned. This occurred twice enroute to hospital. On arrival patient was hypotensive and responding to pain only. Patient breathing on her own at this time. Eval AMS  EXAM: CT HEAD WITHOUT CONTRAST  TECHNIQUE: Contiguous axial images were obtained from the base of the skull through the vertex without intravenous contrast.  COMPARISON:  04/03/2011  FINDINGS: There is moderate central and cortical atrophy. Periventricular white matter changes are consistent with small vessel disease. There is no intra or extra-axial fluid collection or mass lesion. The basilar cisterns and ventricles have a normal appearance. There is no CT evidence for acute infarction or hemorrhage. Bone windows show no acute findings. There is atherosclerotic calcification of the internal carotid arteries.  IMPRESSION: 1. Atrophy and small vessel disease. 2.  No evidence for acute intracranial abnormality.   Electronically Signed   By: Norva Pavlov M.D.   On: 12/13/2014 10:56   Dg Chest Port 1 View  12/15/2014   CLINICAL DATA:  Respiratory distress  EXAM: PORTABLE CHEST - 1 VIEW  COMPARISON:  12/14/2014  FINDINGS: There is a tracheostomy tube in satisfactory position. There is bilateral interstitial and alveolar airspace opacities. There is a trace right pleural effusion. There is a moderate left pleural effusion with associated airspace disease likely reflecting atelectasis. There is prominence of the central pulmonary  vasculature. There is no pneumothorax. There is stable cardiomegaly. There is evidence of prior CABG. There is no acute osseous abnormality.  IMPRESSION: 1. Overall findings most consistent with CHF. 2. Moderate left pleural effusion with associated airspace disease likely reflecting atelectasis.   Electronically Signed   By: Elige Ko   On: 12/15/2014 09:20   Portable Chest 1 View  12/14/2014   CLINICAL DATA:  69 year old female with respiratory failure, sepsis. Initial encounter.  EXAM: PORTABLE CHEST - 1 VIEW  COMPARISON:  12/13/2014 and earlier.  FINDINGS: Portable AP semi upright view at 0555 hours. Stable tracheostomy tube. Stable severe cardiomegaly. Other mediastinal contours are stable, suggestion of some central pulmonary artery enlargement. Mildly decreased pulmonary vascularity. No pneumothorax. Continued dense retrocardiac opacity. Less veiling opacity at the right lung base. Chronic bilateral lateral rib fractures.  IMPRESSION: 1. Severe cardiomegaly with dense lower lobe collapse or consolidation greater on the left. 2. Mildly decreased pulmonary vascularity and veiling opacity at the right lung base suggesting some improvement in pulmonary edema and right pleural effusion.   Electronically Signed   By: Odessa Fleming M.D.   On: 12/14/2014 07:57   MAJOR EVENTS/TEST RESULTS: Admitted 9/4 for resp failure and sepsis 9/5 placed back on vent likely mucus plugs   INDWELLING DEVICES::  MICRO DATA: MRSA PCR >>+MRSA Urine >> E. Coli(ESBL) Blood>> Resp >>  ANTIMICROBIALS:  Zosyn 9/4>>9/6 Vancomycin 9/4>>9/6 cipro 9/6>>     ASSESSMENT/PLAN  69 yo white female with acute resp failure from acute encephalopathy from acute UTI and GPC bacteremia with sepsis, now with resp failure placed back on vent likley due to mucus plugs-  1.Respiratory Failure -continue Full MV support as needed-will wean to TCT today -continue Bronchodilator Therapy  2.UTI sepsis ESBL -continue abx   Plan  to wean off vent as tolerated   I have personally obtained a history, examined the patient, evaluated laboratory and independently reviewed imaging results, formulated the assessment and plan and placed orders.  The Patient requires high complexity decision making for assessment and support, frequent evaluation and titration of therapies, application of advanced monitoring technologies and extensive interpretation of multiple databases. Time spent with patient 35 minutes.    Lucie Leather, M.D.  [facility]  Medical Director [facility] Medical Director [facility] Cardio-Pulmonary Department

## 2014-12-16 NOTE — Progress Notes (Signed)
Pt requesting xanax for which she has a prn order for.  Pt however, is currently NPO.  Informed prime doc.  New orders to follow.

## 2014-12-16 NOTE — Progress Notes (Signed)
[facility] CLINIC INFECTIOUS DISEASE PROGRESS NOTE Date of Admission:  12/13/2014     ID: Rachel Brady is a 69 y.o. female with ESBL  Active Problems:   Sepsis   Respiratory distress   Pressure ulcer   Subjective: Tried to come off trach but only lasted 2 hours. No fevers  ROS  Eleven systems are reviewed and negative except per hpi  Medications:  Antibiotics Given (last 72 hours)    Date/Time Action Medication Dose Rate   12/13/14 1735 Given   piperacillin-tazobactam (ZOSYN) IVPB 3.375 g 3.375 g 12.5 mL/hr   12/13/14 1836 Given   vancomycin (VANCOCIN) IVPB 1000 mg/200 mL premix 1,000 mg 200 mL/hr   12/13/14 2255 Given   piperacillin-tazobactam (ZOSYN) IVPB 3.375 g 3.375 g 12.5 mL/hr   12/14/14 0409 Given   vancomycin (VANCOCIN) IVPB 1000 mg/200 mL premix 1,000 mg 200 mL/hr   12/14/14 1610 Given   piperacillin-tazobactam (ZOSYN) IVPB 3.375 g 3.375 g 12.5 mL/hr   12/14/14 1403 Given   piperacillin-tazobactam (ZOSYN) IVPB 3.375 g 3.375 g 12.5 mL/hr   12/14/14 1808 Given   vancomycin (VANCOCIN) IVPB 1000 mg/200 mL premix 1,000 mg 200 mL/hr   12/14/14 2132 Given   piperacillin-tazobactam (ZOSYN) IVPB 3.375 g 3.375 g 12.5 mL/hr   12/15/14 0520 Given   piperacillin-tazobactam (ZOSYN) IVPB 3.375 g 3.375 g 12.5 mL/hr   12/15/14 0520 Given   vancomycin (VANCOCIN) IVPB 1000 mg/200 mL premix 1,000 mg 200 mL/hr   12/15/14 1356 Given   ertapenem (INVANZ) 1 g in sodium chloride 0.9 % 50 mL IVPB 1 g 100 mL/hr   12/15/14 1713 Given   ciprofloxacin (CIPRO) IVPB 400 mg 400 mg 200 mL/hr   12/16/14 0235 Given   ciprofloxacin (CIPRO) IVPB 400 mg 400 mg 200 mL/hr     . antiseptic oral rinse  7 mL Mouth Rinse QID  . apixaban  5 mg Oral BID  . chlorhexidine gluconate  15 mL Mouth Rinse BID  . Chlorhexidine Gluconate Cloth  6 each Topical Q0600  . ciprofloxacin  400 mg Intravenous Q12H  . escitalopram  5 mg Oral Daily  . furosemide  40 mg Intravenous Once  . insulin aspart  0-5 Units  Subcutaneous QHS  . insulin aspart  0-9 Units Subcutaneous TID WC  . mupirocin ointment  1 application Nasal BID  . nystatin   Topical TID  . pantoprazole (PROTONIX) IV  40 mg Intravenous Q24H  . QUEtiapine  50 mg Oral QHS    Objective: Vital signs in last 24 hours: Temp:  [97.7 F (36.5 C)-100 F (37.8 C)] 98.1 F (36.7 C) (09/07 1300) Pulse Rate:  [62-121] 79 (09/07 1300) Resp:  [14-28] 18 (09/07 1300) BP: (70-194)/(52-118) 128/81 mmHg (09/07 1300) SpO2:  [95 %-100 %] 100 % (09/07 1300) FiO2 (%):  [30 %] 30 % (09/07 1155) Constitutional: Trached, sedated  HENT: [place]/AT, PERRLA, no scleral icterus Mouth/Throat: Oropharynx is clear and dry . No oropharyngeal exudate.  Cardiovascular: Normal rate, regular rhythm and normal heart sounds. Exam reveals no gallop and no friction rub.  Pulmonary/Chest: rhonchi bilateralluy Neck = supple, no nuchal rigidity Abdominal: Soft. Bowel sounds are normal. exhibits no distension. There is no tenderness.  Lymphadenopathy: no cervical adenopathy. No axillary adenopathy Neurological: alert and oriented to person, place, and time.  Skin: Skin is warm and dry. No rash noted. No erythema.  Psychiatric: a normal mood and affect. behavior is normal.  Foley cath  Lab Results  Recent Labs  12/14/14 0408  12/15/14 1048  WBC 6.9 6.4  HGB 8.8* 10.2*  HCT 27.5* 33.0*  NA 140 140  K 4.4 4.0  CL 107 105  CO2 29 29  BUN 34* 31*  CREATININE 1.41* 1.37*    Microbiology: Results for orders placed or performed during the hospital encounter of 12/13/14  Blood Culture (routine x 2)     Status: None (Preliminary result)   Collection Time: 12/13/14  9:25 AM  Result Value Ref Range Status   Specimen Description BLOOD LEFT ARM  Final   Special Requests BOTTLES DRAWN AEROBIC AND ANAEROBIC  Final   Culture  Setup Time   Final    GRAM POSITIVE COCCI IN CLUSTERS ANAEROBIC BOTTLE ONLY CRITICAL RESULT CALLED TO, READ BACK BY AND VERIFIED WITH:  LESLIE LEWIS 12/14/2014 0630 [facility] CONFIRMED BY [facility]    Culture   Final    COAGULASE NEGATIVE STAPHYLOCOCCUS ANAEROBIC BOTTLE ONLY Results consistent with contamination.    Report Status PENDING  Incomplete  Blood Culture (routine x 2)     Status: None (Preliminary result)   Collection Time: 12/13/14  9:35 AM  Result Value Ref Range Status   Specimen Description BLOOD RIGHT FORARM  Final   Special Requests BOTTLES DRAWN AEROBIC AND ANAEROBIC  Final   Culture NO GROWTH 3 DAYS  Final   Report Status PENDING  Incomplete  Urine culture     Status: None   Collection Time: 12/13/14  9:35 AM  Result Value Ref Range Status   Specimen Description URINE, RANDOM  Final   Special Requests NONE  Final   Culture   Final    >=100,000 COLONIES/mL ESCHERICHIA COLI ESBL-EXTENDED SPECTRUM BETA LACTAMASE-THE ORGANISM IS RESISTANT TO PENICILLINS, CEPHALOSPORINS AND AZTREONAM ACCORDING TO CLSI M100-S15 VOL.25 N01 JAN 2005. Results Called to: Derald Macleod AT 1119 12/15/14 CTJ    Report Status 12/15/2014 FINAL  Final   Organism ID, Bacteria ESCHERICHIA COLI  Final      Susceptibility   Escherichia coli - MIC*    AMPICILLIN >=32 RESISTANT Resistant     CEFTAZIDIME 16 RESISTANT Resistant     CEFAZOLIN >=64 RESISTANT Resistant     CEFTRIAXONE 32 RESISTANT Resistant     GENTAMICIN <=1 SENSITIVE Sensitive     IMIPENEM <=0.25 SENSITIVE Sensitive     TRIMETH/SULFA >=320 RESISTANT Resistant     Extended ESBL POSITIVE Resistant     CIPROFLOXACIN Value in next row Sensitive      SENSITIVE<=0.25    NITROFURANTOIN Value in next row Sensitive      SENSITIVE<=16    PIP/TAZO Value in next row Resistant      RESISTANT>=128    ERTAPENEM Value in next row Sensitive      SENSITIVE<=0.5    * >=100,000 COLONIES/mL ESCHERICHIA COLI  MRSA PCR Screening     Status: Abnormal   Collection Time: 12/13/14 12:20 PM  Result Value Ref Range Status   MRSA by PCR POSITIVE (A) NEGATIVE Final    Comment:        The  GeneXpert MRSA Assay (FDA approved for NASAL specimens only), is one component of a comprehensive MRSA colonization surveillance program. It is not intended to diagnose MRSA infection nor to guide or monitor treatment for MRSA infections. CRITICAL RESULT CALLED TO, READ BACK BY AND VERIFIED WITH: ADAM SCARBORO @1338  ON 12/13/14 BY HP   Culture, expectorated sputum-assessment     Status: None   Collection Time: 12/15/14 10:49 AM  Result Value Ref Range Status  Specimen Description ENDOTRACHEAL  Final   Special Requests NONE  Final   Sputum evaluation THIS SPECIMEN IS ACCEPTABLE FOR SPUTUM CULTURE  Final   Report Status 12/16/2014 FINAL  Final  Culture, respiratory (NON-Expectorated)     Status: None (Preliminary result)   Collection Time: 12/15/14 10:49 AM  Result Value Ref Range Status   Specimen Description ENDOTRACHEAL  Final   Special Requests NONE Reflexed from [id number]  Final   Gram Stain PENDING  Incomplete   Culture   Final    MODERATE GROWTH GRAM NEGATIVE RODS IDENTIFICATION AND SUSCEPTIBILITIES TO FOLLOW ONCE ISOLATED    Report Status PENDING  Incomplete    Studies/Results: Dg Chest Port 1 View  12/15/2014   CLINICAL DATA:  Respiratory distress  EXAM: PORTABLE CHEST - 1 VIEW  COMPARISON:  12/14/2014  FINDINGS: There is a tracheostomy tube in satisfactory position. There is bilateral interstitial and alveolar airspace opacities. There is a trace right pleural effusion. There is a moderate left pleural effusion with associated airspace disease likely reflecting atelectasis. There is prominence of the central pulmonary vasculature. There is no pneumothorax. There is stable cardiomegaly. There is evidence of prior CABG. There is no acute osseous abnormality.  IMPRESSION: 1. Overall findings most consistent with CHF. 2. Moderate left pleural effusion with associated airspace disease likely reflecting atelectasis.   Electronically Signed   By: Elige Ko   On: 12/15/2014 09:20     Assessment/Plan: MONTINE HIGHT is a 69 y.o. female admitted with sepsis from ESBL E colu UTI, possible PNA and respiratory arrest. Bcx 1/2 with CNS likely contaminant. Fever curve improving, wbc nml.   Recommendations Continue cipro as the E coli was sensitive to this - would do 10 day course Follow trach asp culture - GNR growing- if not sensitve to cipro will adjust Thank you very much for the consult. Will follow with you.  FITZGERALD, DAVID   12/16/2014, 2:00 PM

## 2014-12-17 ENCOUNTER — Inpatient Hospital Stay: Payer: Medicare Other

## 2014-12-17 LAB — BASIC METABOLIC PANEL
ANION GAP: 7 (ref 5–15)
BUN: 19 mg/dL (ref 6–20)
CO2: 27 mmol/L (ref 22–32)
Calcium: 10.7 mg/dL — ABNORMAL HIGH (ref 8.9–10.3)
Chloride: 107 mmol/L (ref 101–111)
Creatinine, Ser: 1.07 mg/dL — ABNORMAL HIGH (ref 0.44–1.00)
GFR, EST NON AFRICAN AMERICAN: 52 mL/min — AB (ref >60)
GLUCOSE: 92 mg/dL (ref 65–99)
POTASSIUM: 3.5 mmol/L (ref 3.5–5.1)
Sodium: 141 mmol/L (ref 135–145)

## 2014-12-17 LAB — PHOSPHORUS: PHOSPHORUS: 2.3 mg/dL — AB (ref 2.5–4.6)

## 2014-12-17 LAB — GLUCOSE, CAPILLARY
GLUCOSE-CAPILLARY: 96 mg/dL (ref 65–99)
GLUCOSE-CAPILLARY: 81 mg/dL (ref 65–99)
GLUCOSE-CAPILLARY: 80 mg/dL (ref 65–99)
Glucose-Capillary: 97 mg/dL (ref 65–99)

## 2014-12-17 LAB — MAGNESIUM: Magnesium: 1.2 mg/dL — ABNORMAL LOW (ref 1.7–2.4)

## 2014-12-17 MED ORDER — FREE WATER
100.0000 mL | Status: DC
  Administered 2014-12-17 – 2014-12-21 (×23): 100 mL

## 2014-12-17 MED ORDER — IPRATROPIUM-ALBUTEROL 0.5-2.5 (3) MG/3ML IN SOLN
3.0000 mL | RESPIRATORY_TRACT | Status: DC
  Administered 2014-12-17 – 2014-12-21 (×24): 3 mL via RESPIRATORY_TRACT
  Filled 2014-12-17 (×25): qty 3

## 2014-12-17 MED ORDER — PRO-STAT SUGAR FREE PO LIQD
30.0000 mL | Freq: Every day | ORAL | Status: DC
  Administered 2014-12-17 – 2014-12-21 (×5): 30 mL

## 2014-12-17 MED ORDER — VITAL HIGH PROTEIN PO LIQD
1000.0000 mL | ORAL | Status: DC
  Administered 2014-12-17: 1000 mL

## 2014-12-17 MED ORDER — SENNOSIDES-DOCUSATE SODIUM 8.6-50 MG PO TABS: 1.0000 | ORAL_TABLET | Freq: Two times a day (BID) | ORAL | Status: DC | PRN

## 2014-12-17 MED ORDER — HYDRALAZINE HCL 20 MG/ML IJ SOLN
20.0000 mg | INTRAMUSCULAR | Status: DC | PRN
  Administered 2014-12-20 – 2014-12-29 (×2): 20 mg via INTRAVENOUS
  Filled 2014-12-17 (×3): qty 1

## 2014-12-17 NOTE — Progress Notes (Signed)
[facility] - [place] at [facility]   PATIENT NAME: Rachel Brady    MR#:  [id number]  DATE OF BIRTH:  06-06-1945  SUBJECTIVE:  CHIEF COMPLAINT:   Chief Complaint  Patient presents with  . Respiratory Arrest  off vent, on trach collar.  REVIEW OF SYSTEMS:  Review of Systems  Unable to perform ROS: critical illness   DRUG ALLERGIES:   Allergies  Allergen Reactions  . Codeine     Has tolerated oxycodone in the past.   VITALS:  Blood pressure 166/94, pulse 83, temperature 100 F (37.8 C), temperature source Core (Comment), resp. rate 20, height  (1.702 m), weight 96.4 kg (212 lb 8.4 oz), SpO2 100 %. PHYSICAL EXAMINATION:  Physical Exam  Constitutional: She is oriented to person, place, and time and well-developed, well-nourished, and in no distress.  HENT:  Head: Normocephalic and atraumatic.  Trach in place  Eyes: Conjunctivae and EOM are normal. Pupils are equal, round, and reactive to light.  Neck: Normal range of motion. Neck supple. No tracheal deviation present. No thyromegaly present.  Cardiovascular: Normal rate, regular rhythm and normal heart sounds.   Pulmonary/Chest: Effort normal and breath sounds normal. No respiratory distress. She has no wheezes. She exhibits no tenderness.  Abdominal: Soft. Bowel sounds are normal. She exhibits no distension. There is no tenderness.  Musculoskeletal: Normal range of motion.  Neurological: She is alert and oriented to person, place, and time. No cranial nerve deficit.  Skin: Skin is warm and dry. No rash noted.  Psychiatric: Mood and affect normal.   LABORATORY PANEL:   CBC  Recent Labs Lab 12/15/14 1048  WBC 6.4  HGB 10.2*  HCT 33.0*  PLT 127*   ------------------------------------------------------------------------------------------------------------------ Chemistries   Recent Labs Lab 12/13/14 0925  12/17/14 1114  NA 141  < > 141  K 4.9  < > 3.5  CL 102  < > 107  CO2 32   < > 27  GLUCOSE 180*  < > 92  BUN 34*  < > 19  CREATININE 1.26*  < > 1.07*  CALCIUM 11.9*  < > 10.7*  MG  --   < > 1.2*  AST 32  --   --   ALT 87*  --   --   ALKPHOS 128*  --   --   BILITOT 0.5  --   --   < > = values in this interval not displayed. RADIOLOGY:  No results found. ASSESSMENT AND PLAN:  This is a 69 year old female with a history of tracheostomy, atrial fibrillation, diabetes who presents with sepsis.  1. Sepsis with hypotension: secondary to urinary tract infection. Appreciate ID input. Cipro should be good for now.  2. ESBL E.coli Urinary tract infection: Cipro for now.  Appreciate infectious disease input.  I commenced 10 days of course.  3. Healthcare acquired pneumonia: on cipro. CCM & ID following, trach suction sputum growing gram-negative rods  4. Acute respiratory failure: on TCT, wean fio2 as tolerated  5. Atrial fibrillation: Her heart rate is well controlled at this time  6. Diabetes: Consider starting tube feeds if any can be placed, sliding scale insulin after that  7. Depression/anxiety: If able to give these medications via NG tube we will continue these.  8. Fungal skin infection: continue nystatin.   All the records are reviewed and case discussed with Care Management/Social Worker. Management plans discussed with the patient, family and they are in agreement.  CODE STATUS: Full code  TOTAL TIME (CRITICAL CARE) TAKING CARE OF THIS PATIENT: 35 minutes.   More than 50% of the time was spent in counseling/coordination of care: YES   Hoping for long-term acute care placement either later today or tomorrow, depending on bed availability and insurance authorization.  [facility], Amethyst Gainer M.D on 12/17/2014 at 2:40 PM  Between 7am to 6pm - Pager - [phone]  After 6pm go to [email or web address] - password [email or web address] [facility]  [place] [facility]  Office  [phone]  CC: Primary care physician; Dorothey Baseman, MD

## 2014-12-17 NOTE — Progress Notes (Signed)
PHARMACY - CRITICAL CARE PROGRESS NOTE  Pharmacy Consult for Constipation Prevention   Allergies  Allergen Reactions  . Codeine     Has tolerated oxycodone in the past.    Patient Measurements: Height:  (170.2 cm) Weight: 212 lb 8.4 oz (96.4 kg) IBW/kg (Calculated) : 61.6  Vital Signs: Temp: 100 F (37.8 C) (09/08 0700) BP: 162/102 mmHg (09/08 0700) Pulse Rate: 73 (09/08 0700) Intake/Output from previous day: 09/07 0701 - 09/08 0700 In: 1954.2 [I.V.:1554.2; IV Piggyback:400] Out: 2000 [Urine:2000] Intake/Output from this shift:   Vent settings for last 24 hours: Vent Mode:  [-] PRVC FiO2 (%):  [30 %] 30 % Set Rate:  [16 bmp] 16 bmp Vt Set:  [500 mL] 500 mL PEEP:  [5 cmH20] 5 cmH20  Labs:  Recent Labs  12/15/14 1048  WBC 6.4  HGB 10.2*  HCT 33.0*  PLT 127*  CREATININE 1.37*  MG 1.6*  PHOS 3.6   Estimated Creatinine Clearance: 46.8 mL/min (by C-G formula based on Cr of 1.37).   Recent Labs  12/16/14 1634 12/16/14 2050 12/17/14 0731  GLUCAP 127* 86 96    Microbiology: Recent Results (from the past 720 hour(s))  Blood Culture (routine x 2)     Status: None (Preliminary result)   Collection Time: 12/13/14  9:25 AM  Result Value Ref Range Status   Specimen Description BLOOD LEFT ARM  Final   Special Requests BOTTLES DRAWN AEROBIC AND ANAEROBIC  Final   Culture  Setup Time   Final    GRAM POSITIVE COCCI IN CLUSTERS ANAEROBIC BOTTLE ONLY CRITICAL RESULT CALLED TO, READ BACK BY AND VERIFIED WITH: LESLIE LEWIS 12/14/2014 0630 [facility] CONFIRMED BY [facility]    Culture   Final    COAGULASE NEGATIVE STAPHYLOCOCCUS ANAEROBIC BOTTLE ONLY Results consistent with contamination.    Report Status PENDING  Incomplete  Blood Culture (routine x 2)     Status: None (Preliminary result)   Collection Time: 12/13/14  9:35 AM  Result Value Ref Range Status   Specimen Description BLOOD RIGHT FORARM  Final   Special Requests BOTTLES DRAWN AEROBIC AND ANAEROBIC   Final   Culture NO GROWTH 4 DAYS  Final   Report Status PENDING  Incomplete  Urine culture     Status: None   Collection Time: 12/13/14  9:35 AM  Result Value Ref Range Status   Specimen Description URINE, RANDOM  Final   Special Requests NONE  Final   Culture   Final    >=100,000 COLONIES/mL ESCHERICHIA COLI ESBL-EXTENDED SPECTRUM BETA LACTAMASE-THE ORGANISM IS RESISTANT TO PENICILLINS, CEPHALOSPORINS AND AZTREONAM ACCORDING TO CLSI M100-S15 VOL.25 N01 JAN 2005. Results Called to: Derald Macleod AT 1119 12/15/14 CTJ    Report Status 12/15/2014 FINAL  Final   Organism ID, Bacteria ESCHERICHIA COLI  Final      Susceptibility   Escherichia coli - MIC*    AMPICILLIN >=32 RESISTANT Resistant     CEFTAZIDIME 16 RESISTANT Resistant     CEFAZOLIN >=64 RESISTANT Resistant     CEFTRIAXONE 32 RESISTANT Resistant     GENTAMICIN <=1 SENSITIVE Sensitive     IMIPENEM <=0.25 SENSITIVE Sensitive     TRIMETH/SULFA >=320 RESISTANT Resistant     Extended ESBL POSITIVE Resistant     CIPROFLOXACIN Value in next row Sensitive      SENSITIVE<=0.25    NITROFURANTOIN Value in next row Sensitive      SENSITIVE<=16    PIP/TAZO Value in next row Resistant  RESISTANT>=128    ERTAPENEM Value in next row Sensitive      SENSITIVE<=0.5    * >=100,000 COLONIES/mL ESCHERICHIA COLI  MRSA PCR Screening     Status: Abnormal   Collection Time: 12/13/14 12:20 PM  Result Value Ref Range Status   MRSA by PCR POSITIVE (A) NEGATIVE Final    Comment:        The GeneXpert MRSA Assay (FDA approved for NASAL specimens only), is one component of a comprehensive MRSA colonization surveillance program. It is not intended to diagnose MRSA infection nor to guide or monitor treatment for MRSA infections. CRITICAL RESULT CALLED TO, READ BACK BY AND VERIFIED WITH: ADAM SCARBORO @1338  ON 12/13/14 BY HP   Culture, expectorated sputum-assessment     Status: None   Collection Time: 12/15/14 10:49 AM  Result Value  Ref Range Status   Specimen Description ENDOTRACHEAL  Final   Special Requests NONE  Final   Sputum evaluation THIS SPECIMEN IS ACCEPTABLE FOR SPUTUM CULTURE  Final   Report Status 12/16/2014 FINAL  Final  Culture, respiratory (NON-Expectorated)     Status: None (Preliminary result)   Collection Time: 12/15/14 10:49 AM  Result Value Ref Range Status   Specimen Description ENDOTRACHEAL  Final   Special Requests NONE Reflexed from [id number]  Final   Gram Stain PENDING  Incomplete   Culture   Final    MODERATE GROWTH GRAM NEGATIVE RODS IDENTIFICATION AND SUSCEPTIBILITIES TO FOLLOW ONCE ISOLATED    Report Status PENDING  Incomplete    Medications:  Scheduled:  . antiseptic oral rinse  7 mL Mouth Rinse QID  . apixaban  5 mg Oral BID  . chlorhexidine gluconate  15 mL Mouth Rinse BID  . Chlorhexidine Gluconate Cloth  6 each Topical Q0600  . ciprofloxacin  400 mg Intravenous Q12H  . escitalopram  5 mg Oral Daily  . insulin aspart  0-5 Units Subcutaneous QHS  . insulin aspart  0-9 Units Subcutaneous TID WC  . ipratropium-albuterol  3 mL Nebulization Q4H  . mupirocin ointment  1 application Nasal BID  . pantoprazole (PROTONIX) IV  40 mg Intravenous Q24H  . QUEtiapine  50 mg Oral QHS   Infusions:  . dextrose 60 mL/hr at 12/17/14 0030   PRN: acetaminophen **OR** acetaminophen, ALPRAZolam, alum & mag hydroxide-simeth, diazepam, hydrALAZINE, HYDROcodone-acetaminophen, Influenza vac split quadrivalent PF, metoprolol, morphine injection, ondansetron **OR** ondansetron (ZOFRAN) IV, pneumococcal 23 valent vaccine, senna-docusate  Assessment: 69 y/o F with acute respiratory failure, sepsis, ESBL UTI.   Plan:  Last BM was 9/5 so will begin senna/docusate 1 po bid prn. Will f/u AM.   Rachel Brady D 12/17/2014,11:38 AM

## 2014-12-17 NOTE — Progress Notes (Signed)
Nutrition Follow-up    INTERVENTION:   EN: recommend Vital High Protein at initial rate of 25 ml/hr with goal rate of 52 ml/hr with Prostat daily providing 1348 kcals, 125 g of protein, 1048 mL of free water. Recommend starting free water flush at 100 mL q 4 hours. Per discussion during ICU rounds, starting constipation prevention protocol as well. D5 to be discontinued once TF infusing   NUTRITION DIAGNOSIS:   Inadequate oral intake related to acute illness as evidenced by NPO status. Being addressed via TF   GOAL:   Patient will meet greater than or equal to 90% of their needs  MONITOR:    (Energy Intake, Pulmonary, Digestive System, Anthropometrics, Electrolyte/Renal Profile, Glucose Profile)  REASON FOR ASSESSMENT:   Consult Enteral/tube feeding initiation and management  ASSESSMENT:    Pt remains on vent via trach, noted plan for possible discharge to LTAC, plan for dobhoff insertion for feedings  Diet Order:  Diet NPO time specified  Skin:   (stage II buttock)  Last BM:    9/5  Electrolyte and Renal Profile:  Recent Labs Lab 12/14/14 0408 12/15/14 1048 12/17/14 1114  BUN 34* 31* 19  CREATININE 1.41* 1.37* 1.07*  NA 140 140 141  K 4.4 4.0 3.5  MG  --  1.6* 1.2*  PHOS  --  3.6 2.3*   Glucose Profile:  Recent Labs  12/16/14 2050 12/17/14 0731 12/17/14 1126  GLUCAP 86 96 81   Protein Profile:  Recent Labs Lab 12/13/14 0925  ALBUMIN 3.1*   Nutritional Anemia Profile:  CBC Latest Ref Rng 12/15/2014 12/14/2014 12/13/2014  WBC 3.6 - 11.0 K/uL 6.4 6.9 8.9  Hemoglobin 12.0 - 16.0 g/dL 10.2(L) 8.8(L) 9.7(L)  Hematocrit 35.0 - 47.0 % 33.0(L) 27.5(L) 31.7(L)  Platelets 150 - 440 K/uL 127(L) 136(L) 169    Meds: D5 at 50 ml/hr, lasix, ss novolog,   Height:   Ht Readings from Last 1 Encounters:  12/13/14  (1.702 m)    Weight:   Wt Readings from Last 1 Encounters:  12/13/14 212 lb 8.4 oz (96.4 kg)    BMI:  Body mass index is 33.28  kg/(m^2).  Estimated Nutritional Needs:   Kcal:  1610-9604 kcals (11-14 kcals/kg)   Protein:  122-153 g (2.0-2.5 g/kg) using IBW 61 kg  Fluid:  1525-1830 mL (25-30 ml/kg)    HIGH Care Level  Romelle Starcher MS, RD, LDN [phone] Pager

## 2014-12-17 NOTE — Consult Note (Signed)
[facility] Pulmonary Medicine Consultation      Date: 12/17/2014,   MRN# [id number] DELTA PICHON [id number] Code Status:     Code Status Orders        Start     Ordered   12/13/14 1231  Full code   Continuous     12/13/14 1230     Hosp day:@LENGTHOFSTAYDAYS @ Referring MD: @     PCP:      AdmissionWeight: 211 lb (95.709 kg)                 CurrentWeight: 212 lb 8.4 oz (96.4 kg)      CHIEF COMPLAINT:  SOb and confusion  SOb  HISTORY OF PRESENT ILLNESS   Patient more alert and awake this AM, will place back on TCT today, wean fio2 as tolerated Xanax as needed, will place dubhoff tube for Tf's   PAST MEDICAL HISTORY   Past Medical History  Diagnosis Date  . Tracheostomy in place   . Diabetes   . CKD (chronic kidney disease)   . OSA (obstructive sleep apnea)   . HTN (hypertension)   . MDD (major depressive disorder)      SURGICAL HISTORY   History reviewed. No pertinent past surgical history.   FAMILY HISTORY   History reviewed. No pertinent family history.   SOCIAL HISTORY   Social History  Substance Use Topics  . Smoking status: Never Smoker   . Smokeless tobacco: None  . Alcohol Use: None     MEDICATIONS    Home Medication:  No current outpatient prescriptions on file.  Current Medication:  Current facility-administered medications:  .  acetaminophen (TYLENOL) tablet 650 mg, 650 mg, Oral, Q6H PRN **OR** acetaminophen (TYLENOL) suppository 650 mg, 650 mg, Rectal, Q6H PRN, Adrian Saran, MD .  ALPRAZolam Prudy Feeler) tablet 0.5 mg, 0.5 mg, Oral, Q8H PRN, Erin Fulling, MD, 0.5 mg at 12/16/14 0919 .  alum & mag hydroxide-simeth (MAALOX/MYLANTA) 200-200-20 MG/5ML suspension 30 mL, 30 mL, Oral, Q6H PRN, Adrian Saran, MD .  antiseptic oral rinse solution (CORINZ), 7 mL, Mouth Rinse, QID, Sital Mody, MD, 7 mL at 12/16/14 1539 .  apixaban (ELIQUIS) tablet 5 mg, 5 mg, Oral, BID, Adrian Saran, MD, 5 mg at 12/16/14 0919 .  chlorhexidine  gluconate (PERIDEX) 0.12 % solution 15 mL, 15 mL, Mouth Rinse, BID, Adrian Saran, MD, 15 mL at 12/16/14 2125 .  Chlorhexidine Gluconate Cloth 2 % PADS 6 each, 6 each, Topical, Q0600, Delfino Lovett, MD, 6 each at 12/17/14 0534 .  ciprofloxacin (CIPRO) IVPB 400 mg, 400 mg, Intravenous, Q12H, Clydie Braun, MD, 400 mg at 12/17/14 0338 .  dextrose 5 % solution, , Intravenous, Continuous, Delfino Lovett, MD, Last Rate: 60 mL/hr at 12/17/14 0030 .  diazepam (VALIUM) injection 5 mg, 5 mg, Intravenous, Q6H PRN, Erin Fulling, MD .  escitalopram (LEXAPRO) tablet 5 mg, 5 mg, Oral, Daily, Sital Mody, MD, 5 mg at 12/16/14 0919 .  hydrALAZINE (APRESOLINE) injection 20 mg, 20 mg, Intravenous, Q4H PRN, Erin Fulling, MD, 20 mg at 12/16/14 0919 .  HYDROcodone-acetaminophen (NORCO/VICODIN) 5-325 MG per tablet 1-2 tablet, 1-2 tablet, Oral, Q4H PRN, Adrian Saran, MD .  Influenza vac split quadrivalent PF (FLUARIX) injection 0.5 mL, 0.5 mL, Intramuscular, Prior to discharge, Delfino Lovett, MD .  insulin aspart (novoLOG) injection 0-5 Units, 0-5 Units, Subcutaneous, QHS, Adrian Saran, MD, 0 Units at 12/13/14 2149 .  insulin aspart (novoLOG) injection 0-9 Units, 0-9 Units, Subcutaneous, TID WC, Adrian Saran, MD, 1 Units  at 12/16/14 1755 .  LORazepam (ATIVAN) injection 0.5 mg, 0.5 mg, Intravenous, Q6H PRN, Wyatt Haste, MD, 0.5 mg at 12/17/14 0352 .  metoprolol (LOPRESSOR) injection 5 mg, 5 mg, Intravenous, Q3H PRN, Stephanie Acre, MD, 5 mg at 12/15/14 1703 .  morphine 2 MG/ML injection 2 mg, 2 mg, Intravenous, Q1H PRN, Erin Fulling, MD, 2 mg at 12/16/14 0544 .  mupirocin ointment (BACTROBAN) 2 % 1 application, 1 application, Nasal, BID, Delfino Lovett, MD, 1 application at 12/16/14 2119 .  ondansetron (ZOFRAN) tablet 4 mg, 4 mg, Oral, Q6H PRN **OR** ondansetron (ZOFRAN) injection 4 mg, 4 mg, Intravenous, Q6H PRN, Adrian Saran, MD .  pantoprazole (PROTONIX) injection 40 mg, 40 mg, Intravenous, Q24H, Karl Ito, MD, 40 mg at 12/16/14 1826 .   pneumococcal 23 valent vaccine (PNU-IMMUNE) injection 0.5 mL, 0.5 mL, Intramuscular, Prior to discharge, Delfino Lovett, MD .  QUEtiapine (SEROQUEL) tablet 50 mg, 50 mg, Oral, QHS, Adrian Saran, MD, 50 mg at 12/14/14 2132 .  senna-docusate (Senokot-S) tablet 1 tablet, 1 tablet, Oral, QHS PRN, Adrian Saran, MD    ALLERGIES   Codeine     REVIEW OF SYSTEMS   Review of Systems  Constitutional: Positive for malaise/fatigue.  Eyes: Negative.   Respiratory: Negative for shortness of breath.   Cardiovascular: Negative.   Gastrointestinal: Negative.   Musculoskeletal: Negative.   Neurological: Negative for headaches.  Psychiatric/Behavioral: The patient is nervous/anxious.   All other systems reviewed and are negative.    VS: BP 162/102 mmHg  Pulse 73  Temp(Src) 100 F (37.8 C) (Core (Comment))  Resp 17  Ht 5\' 7"  (1.702 m)  Wt 212 lb 8.4 oz (96.4 kg)  BMI 33.28 kg/m2  SpO2 100%     PHYSICAL EXAM  Physical Exam  Constitutional: No distress.  S/p trach  HENT:  Head: Normocephalic and atraumatic.  Mouth/Throat: No oropharyngeal exudate.  Eyes: EOM are normal. Pupils are equal, round, and reactive to light. No scleral icterus.  Neck: Normal range of motion. Neck supple.  Cardiovascular: Normal rate, regular rhythm and normal heart sounds.   No murmur heard. Pulmonary/Chest: No stridor. No respiratory distress. She has no wheezes. She has rales.  Abdominal: Soft. Bowel sounds are normal. She exhibits no distension. There is no tenderness. There is no rebound.  Musculoskeletal: Normal range of motion. She exhibits no edema.  Neurological: She displays normal reflexes. Coordination normal.  Lethargy and confusion  Skin: Skin is warm. She is not diaphoretic.  Psychiatric: She has a normal mood and affect.        LABS    Recent Labs     12/15/14  1048  HGB  10.2*  HCT  33.0*  MCV  88.0  WBC  6.4  BUN  31*  CREATININE  1.37*  GLUCOSE  132*  CALCIUM  11.3*  ,    No  results for input(s): PH in the last 72 hours.  Invalid input(s): PCO2, PO2, BASEEXCESS, BASEDEFICITE, TFT    CULTURE RESULTS   Recent Results (from the past 240 hour(s))  Blood Culture (routine x 2)     Status: None (Preliminary result)   Collection Time: 12/13/14  9:25 AM  Result Value Ref Range Status   Specimen Description BLOOD LEFT ARM  Final   Special Requests BOTTLES DRAWN AEROBIC AND ANAEROBIC  Final   Culture  Setup Time   Final    GRAM POSITIVE COCCI IN CLUSTERS ANAEROBIC BOTTLE ONLY CRITICAL RESULT CALLED TO, READ BACK  BY AND VERIFIED WITH: LESLIE LEWIS 12/14/2014 0630 [facility] CONFIRMED BY [facility]    Culture   Final    COAGULASE NEGATIVE STAPHYLOCOCCUS ANAEROBIC BOTTLE ONLY Results consistent with contamination.    Report Status PENDING  Incomplete  Blood Culture (routine x 2)     Status: None (Preliminary result)   Collection Time: 12/13/14  9:35 AM  Result Value Ref Range Status   Specimen Description BLOOD RIGHT FORARM  Final   Special Requests BOTTLES DRAWN AEROBIC AND ANAEROBIC  Final   Culture NO GROWTH 4 DAYS  Final   Report Status PENDING  Incomplete  Urine culture     Status: None   Collection Time: 12/13/14  9:35 AM  Result Value Ref Range Status   Specimen Description URINE, RANDOM  Final   Special Requests NONE  Final   Culture   Final    >=100,000 COLONIES/mL ESCHERICHIA COLI ESBL-EXTENDED SPECTRUM BETA LACTAMASE-THE ORGANISM IS RESISTANT TO PENICILLINS, CEPHALOSPORINS AND AZTREONAM ACCORDING TO CLSI M100-S15 VOL.25 N01 JAN 2005. Results Called to: Derald Macleod AT 1119 12/15/14 CTJ    Report Status 12/15/2014 FINAL  Final   Organism ID, Bacteria ESCHERICHIA COLI  Final      Susceptibility   Escherichia coli - MIC*    AMPICILLIN >=32 RESISTANT Resistant     CEFTAZIDIME 16 RESISTANT Resistant     CEFAZOLIN >=64 RESISTANT Resistant     CEFTRIAXONE 32 RESISTANT Resistant     GENTAMICIN <=1 SENSITIVE Sensitive     IMIPENEM <=0.25  SENSITIVE Sensitive     TRIMETH/SULFA >=320 RESISTANT Resistant     Extended ESBL POSITIVE Resistant     CIPROFLOXACIN Value in next row Sensitive      SENSITIVE<=0.25    NITROFURANTOIN Value in next row Sensitive      SENSITIVE<=16    PIP/TAZO Value in next row Resistant      RESISTANT>=128    ERTAPENEM Value in next row Sensitive      SENSITIVE<=0.5    * >=100,000 COLONIES/mL ESCHERICHIA COLI  MRSA PCR Screening     Status: Abnormal   Collection Time: 12/13/14 12:20 PM  Result Value Ref Range Status   MRSA by PCR POSITIVE (A) NEGATIVE Final    Comment:        The GeneXpert MRSA Assay (FDA approved for NASAL specimens only), is one component of a comprehensive MRSA colonization surveillance program. It is not intended to diagnose MRSA infection nor to guide or monitor treatment for MRSA infections. CRITICAL RESULT CALLED TO, READ BACK BY AND VERIFIED WITH: ADAM SCARBORO @1338  ON 12/13/14 BY HP   Culture, expectorated sputum-assessment     Status: None   Collection Time: 12/15/14 10:49 AM  Result Value Ref Range Status   Specimen Description ENDOTRACHEAL  Final   Special Requests NONE  Final   Sputum evaluation THIS SPECIMEN IS ACCEPTABLE FOR SPUTUM CULTURE  Final   Report Status 12/16/2014 FINAL  Final  Culture, respiratory (NON-Expectorated)     Status: None (Preliminary result)   Collection Time: 12/15/14 10:49 AM  Result Value Ref Range Status   Specimen Description ENDOTRACHEAL  Final   Special Requests NONE Reflexed from [id number]  Final   Gram Stain PENDING  Incomplete   Culture   Final    MODERATE GROWTH GRAM NEGATIVE RODS IDENTIFICATION AND SUSCEPTIBILITIES TO FOLLOW ONCE ISOLATED    Report Status PENDING  Incomplete          IMAGING    Dg Chest 1 View  12/13/2014   CLINICAL DATA:  Respiratory arrest twice this morning.  EXAM: CHEST  1 VIEW  COMPARISON:  08/02/2014.  FINDINGS: Stable enlarged cardiac silhouette and post CABG changes. Tracheostomy tube in  satisfactory position. Dense airspace opacity in the lower half of the left lung. Patchy airspace opacity in the right mid and lower lung zones. Bilateral pleural effusions. Stable prosthetic heart valve and thoracic spine fixation hardware. Thoracic spine degenerative changes.  IMPRESSION: 1. Dense left basilar atelectasis or pneumonia. 2. Right mid and lower lung zone pneumonia or alveolar edema. 3. Cardiomegaly and bilateral pleural effusions.   Electronically Signed   By: Beckie Salts M.D.   On: 12/13/2014 10:17   Ct Head Wo Contrast  12/13/2014   CLINICAL DATA:  Patient presents to the ED via EMS from [facility]. Patient stopped breathing twice with EMS but after they have approx. 5 breaths with bag mask ventilation patient's spontaneous breathing returned. This occurred twice enroute to hospital. On arrival patient was hypotensive and responding to pain only. Patient breathing on her own at this time. Eval AMS  EXAM: CT HEAD WITHOUT CONTRAST  TECHNIQUE: Contiguous axial images were obtained from the base of the skull through the vertex without intravenous contrast.  COMPARISON:  04/03/2011  FINDINGS: There is moderate central and cortical atrophy. Periventricular white matter changes are consistent with small vessel disease. There is no intra or extra-axial fluid collection or mass lesion. The basilar cisterns and ventricles have a normal appearance. There is no CT evidence for acute infarction or hemorrhage. Bone windows show no acute findings. There is atherosclerotic calcification of the internal carotid arteries.  IMPRESSION: 1. Atrophy and small vessel disease. 2.  No evidence for acute intracranial abnormality.   Electronically Signed   By: Norva Pavlov M.D.   On: 12/13/2014 10:56   Dg Chest Port 1 View  12/15/2014   CLINICAL DATA:  Respiratory distress  EXAM: PORTABLE CHEST - 1 VIEW  COMPARISON:  12/14/2014  FINDINGS: There is a tracheostomy tube in satisfactory position. There is bilateral  interstitial and alveolar airspace opacities. There is a trace right pleural effusion. There is a moderate left pleural effusion with associated airspace disease likely reflecting atelectasis. There is prominence of the central pulmonary vasculature. There is no pneumothorax. There is stable cardiomegaly. There is evidence of prior CABG. There is no acute osseous abnormality.  IMPRESSION: 1. Overall findings most consistent with CHF. 2. Moderate left pleural effusion with associated airspace disease likely reflecting atelectasis.   Electronically Signed   By: Elige Ko   On: 12/15/2014 09:20   Portable Chest 1 View  12/14/2014   CLINICAL DATA:  68 year old female with respiratory failure, sepsis. Initial encounter.  EXAM: PORTABLE CHEST - 1 VIEW  COMPARISON:  12/13/2014 and earlier.  FINDINGS: Portable AP semi upright view at 0555 hours. Stable tracheostomy tube. Stable severe cardiomegaly. Other mediastinal contours are stable, suggestion of some central pulmonary artery enlargement. Mildly decreased pulmonary vascularity. No pneumothorax. Continued dense retrocardiac opacity. Less veiling opacity at the right lung base. Chronic bilateral lateral rib fractures.  IMPRESSION: 1. Severe cardiomegaly with dense lower lobe collapse or consolidation greater on the left. 2. Mildly decreased pulmonary vascularity and veiling opacity at the right lung base suggesting some improvement in pulmonary edema and right pleural effusion.   Electronically Signed   By: Odessa Fleming M.D.   On: 12/14/2014 07:57   MAJOR EVENTS/TEST RESULTS: Admitted 9/4 for resp failure and sepsis 9/5 placed back on vent  likely mucus plugs   INDWELLING DEVICES::  MICRO DATA: MRSA PCR >>+MRSA Urine >> E. Coli(ESBL) Blood>> Resp >>gram neg rods>>  ANTIMICROBIALS:  Zosyn 9/4>>9/6 Vancomycin 9/4>>9/6 cipro 9/6>>     ASSESSMENT/PLAN    69 yo white female with acute resp failure from acute encephalopathy from acute UTI and GPC  bacteremia with sepsis, now with resp failure placed back on vent likley due to mucus plugs-sptum culture pending  1.Respiratory Failure -continue Full MV support as needed-will wean to TCT today -continue Bronchodilator Therapy  2.UTI sepsis ESBL -continue abx  3.sputum culture Plan to wean off vent as tolerated   I have personally obtained a history, examined the patient, evaluated laboratory and independently reviewed imaging results, formulated the assessment and plan and placed orders.  The Patient requires high complexity decision making for assessment and support, frequent evaluation and titration of therapies, application of advanced monitoring technologies and extensive interpretation of multiple databases. Time spent with patient 35 minutes. Recommend LTACH referral    Alexiana Laverdure Santiago Glad, M.D.  [facility]  Medical Director [facility] Medical Director [facility] Cardio-Pulmonary Department

## 2014-12-18 LAB — MAGNESIUM
Magnesium: 1.2 mg/dL — ABNORMAL LOW (ref 1.7–2.4)
Magnesium: 1.9 mg/dL (ref 1.7–2.4)

## 2014-12-18 LAB — CULTURE, BLOOD (ROUTINE X 2): CULTURE: NO GROWTH

## 2014-12-18 LAB — GLUCOSE, CAPILLARY
GLUCOSE-CAPILLARY: 108 mg/dL — AB (ref 65–99)
GLUCOSE-CAPILLARY: 121 mg/dL — AB (ref 65–99)
GLUCOSE-CAPILLARY: 110 mg/dL — AB (ref 65–99)
GLUCOSE-CAPILLARY: 124 mg/dL — AB (ref 65–99)
GLUCOSE-CAPILLARY: 112 mg/dL — AB (ref 65–99)
Glucose-Capillary: 109 mg/dL — ABNORMAL HIGH (ref 65–99)

## 2014-12-18 LAB — BASIC METABOLIC PANEL
Anion gap: 7 (ref 5–15)
BUN: 18 mg/dL (ref 6–20)
CHLORIDE: 106 mmol/L (ref 101–111)
CO2: 28 mmol/L (ref 22–32)
CREATININE: 1 mg/dL (ref 0.44–1.00)
Calcium: 10.4 mg/dL — ABNORMAL HIGH (ref 8.9–10.3)
GFR calc Af Amer: >60 mL/min (ref >60)
GFR calc non Af Amer: 57 mL/min — ABNORMAL LOW (ref >60)
Glucose, Bld: 111 mg/dL — ABNORMAL HIGH (ref 65–99)
Potassium: 2.7 mmol/L — CL (ref 3.5–5.1)
Sodium: 141 mmol/L (ref 135–145)

## 2014-12-18 LAB — PHOSPHORUS: Phosphorus: 2.7 mg/dL (ref 2.5–4.6)

## 2014-12-18 LAB — POTASSIUM: Potassium: 3.3 mmol/L — ABNORMAL LOW (ref 3.5–5.1)

## 2014-12-18 MED ORDER — SODIUM CHLORIDE 0.9 % IV SOLN
1.0000 g | Freq: Three times a day (TID) | INTRAVENOUS | Status: AC
  Administered 2014-12-18 – 2015-01-01 (×44): 1 g via INTRAVENOUS
  Filled 2014-12-18 (×46): qty 1

## 2014-12-18 MED ORDER — POTASSIUM CHLORIDE 10 MEQ/100ML IV SOLN
10.0000 meq | INTRAVENOUS | Status: AC
Start: 2014-12-18 — End: 2014-12-19
  Administered 2014-12-18 – 2014-12-19 (×4): 10 meq via INTRAVENOUS
  Filled 2014-12-18 (×4): qty 100

## 2014-12-18 MED ORDER — POTASSIUM CHLORIDE 10 MEQ/100ML IV SOLN
10.0000 meq | INTRAVENOUS | Status: AC
  Administered 2014-12-18 (×6): 10 meq via INTRAVENOUS
  Filled 2014-12-18 (×6): qty 100

## 2014-12-18 MED ORDER — PANTOPRAZOLE SODIUM 40 MG PO PACK
40.0000 mg | PACK | Freq: Every day | ORAL | Status: DC
  Administered 2014-12-18 – 2014-12-20 (×3): 40 mg
  Filled 2014-12-18 (×4): qty 20

## 2014-12-18 MED ORDER — ALPRAZOLAM 0.25 MG PO TABS
0.2500 mg | ORAL_TABLET | Freq: Three times a day (TID) | ORAL | Status: DC | PRN
  Administered 2014-12-18 – 2014-12-27 (×17): 0.25 mg via ORAL
  Filled 2014-12-18 (×18): qty 1

## 2014-12-18 MED ORDER — SENNOSIDES-DOCUSATE SODIUM 8.6-50 MG PO TABS
1.0000 | ORAL_TABLET | Freq: Two times a day (BID) | ORAL | Status: DC
  Administered 2014-12-18 – 2015-01-04 (×22): 1 via ORAL
  Filled 2014-12-18 (×22): qty 1

## 2014-12-18 MED ORDER — MAGNESIUM SULFATE 4 GM/100ML IV SOLN
4.0000 g | Freq: Once | INTRAVENOUS | Status: AC
  Administered 2014-12-18: 4 g via INTRAVENOUS
  Filled 2014-12-18: qty 100

## 2014-12-18 MED ORDER — VITAL HIGH PROTEIN PO LIQD
1000.0000 mL | ORAL | Status: DC
  Administered 2014-12-18: 1000 mL

## 2014-12-18 NOTE — Progress Notes (Signed)
[facility] CLINIC INFECTIOUS DISEASE PROGRESS NOTE Date of Admission:  12/13/2014     ID: Rachel Brady is a 69 y.o. female with ESBL  Active Problems:   Sepsis   Respiratory distress   Pressure ulcer   Subjective: Tried to come off trach - repeating attempt this pm Still with sputum no fevers More alert ROS  Eleven systems are reviewed and negative except per hpi  Medications:  Antibiotics Given (last 72 hours)    Date/Time Action Medication Dose Rate   12/15/14 1713 Given   ciprofloxacin (CIPRO) IVPB 400 mg 400 mg 200 mL/hr   12/16/14 0235 Given   ciprofloxacin (CIPRO) IVPB 400 mg 400 mg 200 mL/hr   12/16/14 1538 Given   ciprofloxacin (CIPRO) IVPB 400 mg 400 mg 200 mL/hr   12/17/14 1610 Given   ciprofloxacin (CIPRO) IVPB 400 mg 400 mg 200 mL/hr   12/17/14 1752 Given   ciprofloxacin (CIPRO) IVPB 400 mg 400 mg 200 mL/hr   12/18/14 0250 Given   ciprofloxacin (CIPRO) IVPB 400 mg 400 mg 200 mL/hr     . antiseptic oral rinse  7 mL Mouth Rinse QID  . apixaban  5 mg Oral BID  . chlorhexidine gluconate  15 mL Mouth Rinse BID  . Chlorhexidine Gluconate Cloth  6 each Topical Q0600  . escitalopram  5 mg Oral Daily  . feeding supplement (PRO-STAT SUGAR FREE 64)  30 mL Per Tube Daily  . feeding supplement (VITAL HIGH PROTEIN)  1,000 mL Per Tube Q24H  . free water  100 mL Per Tube 6 times per day  . insulin aspart  0-5 Units Subcutaneous QHS  . insulin aspart  0-9 Units Subcutaneous TID WC  . ipratropium-albuterol  3 mL Nebulization Q4H  . meropenem (MERREM) IV  1 g Intravenous 3 times per day  . mupirocin ointment  1 application Nasal BID  . pantoprazole sodium  40 mg Per Tube Daily  . potassium chloride  10 mEq Intravenous Q1 Hr x 6  . QUEtiapine  50 mg Oral QHS  . senna-docusate  1 tablet Oral BID    Objective: Vital signs in last 24 hours: Temp:  [98.6 F (37 C)-100 F (37.8 C)] 99.5 F (37.5 C) (09/09 1213) Pulse Rate:  [70-94] 94 (09/09 1213) Resp:  [16-26] 24  (09/09 1213) BP: (108-174)/(62-105) 140/92 mmHg (09/09 1100) SpO2:  [97 %-100 %] 100 % (09/09 1213) FiO2 (%):  [30 %] 30 % (09/09 1212) Weight:  [100.5 kg (221 lb 9 oz)-101.7 kg (224 lb 3.3 oz)] 100.5 kg (221 lb 9 oz) (09/09 0540) Constitutional: Trached, sedated  HENT: [place]/AT, PERRLA, no scleral icterus Mouth/Throat: Oropharynx is clear and dry . No oropharyngeal exudate.  Cardiovascular: Normal rate, regular rhythm and normal heart sounds. Exam reveals no gallop and no friction rub.  Pulmonary/Chest: rhonchi bilateralluy Neck = supple, no nuchal rigidity Abdominal: Soft. Bowel sounds are normal. exhibits no distension. There is no tenderness.  Lymphadenopathy: no cervical adenopathy. No axillary adenopathy Neurological: alert and oriented to person, place, and time.  Skin: Skin is warm and dry. No rash noted. No erythema.  Psychiatric: a normal mood and affect. behavior is normal.  Foley cath  Lab Results  Recent Labs  12/17/14 1114 12/18/14 1002  NA 141 141  K 3.5 2.7*  CL 107 106  CO2 27 28  BUN 19 18  CREATININE 1.07* 1.00    Microbiology: Results for orders placed or performed during the hospital encounter of 12/13/14  Blood  Culture (routine x 2)     Status: None   Collection Time: 12/13/14  9:25 AM  Result Value Ref Range Status   Specimen Description BLOOD LEFT ARM  Final   Special Requests BOTTLES DRAWN AEROBIC AND ANAEROBIC  Final   Culture  Setup Time   Final    GRAM POSITIVE COCCI IN CLUSTERS ANAEROBIC BOTTLE ONLY CRITICAL RESULT CALLED TO, READ BACK BY AND VERIFIED WITH: LESLIE LEWIS 12/14/2014 0630 [facility] CONFIRMED BY [facility]    Culture   Final    COAGULASE NEGATIVE STAPHYLOCOCCUS ANAEROBIC BOTTLE ONLY Results consistent with contamination.    Report Status 12/18/2014 FINAL  Final  Blood Culture (routine x 2)     Status: None   Collection Time: 12/13/14  9:35 AM  Result Value Ref Range Status   Specimen Description BLOOD RIGHT FORARM  Final    Special Requests BOTTLES DRAWN AEROBIC AND ANAEROBIC  Final   Culture NO GROWTH 5 DAYS  Final   Report Status 12/18/2014 FINAL  Final  Urine culture     Status: None   Collection Time: 12/13/14  9:35 AM  Result Value Ref Range Status   Specimen Description URINE, RANDOM  Final   Special Requests NONE  Final   Culture   Final    >=100,000 COLONIES/mL ESCHERICHIA COLI ESBL-EXTENDED SPECTRUM BETA LACTAMASE-THE ORGANISM IS RESISTANT TO PENICILLINS, CEPHALOSPORINS AND AZTREONAM ACCORDING TO CLSI M100-S15 VOL.25 N01 JAN 2005. Results Called to: Derald Macleod AT 1119 12/15/14 CTJ    Report Status 12/15/2014 FINAL  Final   Organism ID, Bacteria ESCHERICHIA COLI  Final      Susceptibility   Escherichia coli - MIC*    AMPICILLIN >=32 RESISTANT Resistant     CEFTAZIDIME 16 RESISTANT Resistant     CEFAZOLIN >=64 RESISTANT Resistant     CEFTRIAXONE 32 RESISTANT Resistant     GENTAMICIN <=1 SENSITIVE Sensitive     IMIPENEM <=0.25 SENSITIVE Sensitive     TRIMETH/SULFA >=320 RESISTANT Resistant     Extended ESBL POSITIVE Resistant     CIPROFLOXACIN Value in next row Sensitive      SENSITIVE<=0.25    NITROFURANTOIN Value in next row Sensitive      SENSITIVE<=16    PIP/TAZO Value in next row Resistant      RESISTANT>=128    ERTAPENEM Value in next row Sensitive      SENSITIVE<=0.5    * >=100,000 COLONIES/mL ESCHERICHIA COLI  MRSA PCR Screening     Status: Abnormal   Collection Time: 12/13/14 12:20 PM  Result Value Ref Range Status   MRSA by PCR POSITIVE (A) NEGATIVE Final    Comment:        The GeneXpert MRSA Assay (FDA approved for NASAL specimens only), is one component of a comprehensive MRSA colonization surveillance program. It is not intended to diagnose MRSA infection nor to guide or monitor treatment for MRSA infections. CRITICAL RESULT CALLED TO, READ BACK BY AND VERIFIED WITH: ADAM SCARBORO @1338  ON 12/13/14 BY HP   Culture, expectorated sputum-assessment      Status: None   Collection Time: 12/15/14 10:49 AM  Result Value Ref Range Status   Specimen Description ENDOTRACHEAL  Final   Special Requests NONE  Final   Sputum evaluation THIS SPECIMEN IS ACCEPTABLE FOR SPUTUM CULTURE  Final   Report Status 12/16/2014 FINAL  Final  Culture, respiratory (NON-Expectorated)     Status: None (Preliminary result)   Collection Time: 12/15/14 10:49 AM  Result Value Ref Range  Status   Specimen Description ENDOTRACHEAL  Final   Special Requests NONE Reflexed from [id number]  Final   Gram Stain   Final    FEW WBC SEEN FEW GRAM NEGATIVE RODS FEW GRAM POSITIVE COCCI IN PAIRS FAIR SPECIMEN - 70-80% WBCS    Culture   Final    MODERATE GROWTH CITROBACTER SPECIES HEAVY GROWTH PSEUDOMONAS AERUGINOSA SUSCEPTIBILITIES TO FOLLOW FOR ORGANISM 1 MULTI-DRUG RESISTANT ORGANISM CRITICAL RESULT CALLED TO, READ BACK BY AND VERIFIED WITH: [facility] BORBA 12/18/14 1042 JGF    Report Status PENDING  Incomplete   Organism ID, Bacteria PSEUDOMONAS AERUGINOSA  Final      Susceptibility   Pseudomonas aeruginosa - MIC*    CEFTAZIDIME >=64 RESISTANT Resistant     CIPROFLOXACIN >=4 RESISTANT Resistant     GENTAMICIN >=16 RESISTANT Resistant     IMIPENEM 1 SENSITIVE Sensitive     * HEAVY GROWTH PSEUDOMONAS AERUGINOSA    Studies/Results: Dg Abd Portable 1v  12/17/2014   CLINICAL DATA:  NG tube placement.  EXAM: PORTABLE ABDOMEN - 1 VIEW  COMPARISON:  None.  FINDINGS: Feeding tube tip is in the fundus of the stomach. Chronic pleural effusions and basilar atelectasis. Moderate air in the nondistended colon.  IMPRESSION: Feeding tube tip is in the fundus of the stomach.   Electronically Signed   By: Francene Boyers M.D.   On: 12/17/2014 14:55    Assessment/Plan: JESSALYNN MCCOWAN is a 69 y.o. female admitted with sepsis from ESBL E colu UTI, PNA and respiratory arrest. Bcx 1/2 with CNS likely contaminant. Fever curve improving, wbc nml.  Sutum with MDR Psuedomonas and  citrobacter  Recommendations Agree with change to meropem - will need picc and 14 day course  (cannot use ertapenem as poor Pseudomonas activity)  Thank you very much for the consult. Will follow with you.  Husayn Reim   12/18/2014, 2:18 PM

## 2014-12-18 NOTE — Progress Notes (Signed)
Speech Therapy Note: pt is on vent support more continuously now d/t declined respiratory status and inability to tolerate trach collar; she appears to fatigue during the weans and needs more support per NSG. Pt has a dobhoff in for nutritional support at this time. ST will f/u on Monday w/ pt's respiratory status and if weaned to trach collar, then will attempt PMV trials again. NSG updated and agreed.

## 2014-12-18 NOTE — Progress Notes (Signed)
Pharmacy Consult for Electrolyte Management   Allergies  Allergen Reactions  . Codeine     Has tolerated oxycodone in the past.    Patient Measurements: Height:  (170.2 cm) Weight: 221 lb 9 oz (100.5 kg) IBW/kg (Calculated) : 61.6  Vital Signs: Temp: 99.3 F (37.4 C) (09/09 0842) BP: 121/71 mmHg (09/09 0842) Pulse Rate: 91 (09/09 0842) Intake/Output from previous day: 09/08 0701 - 09/09 0700 In: 966.7 [I.V.:660; NG/GT:106.7; IV Piggyback:200] Out: 1000 [Urine:1000] Intake/Output from this shift:    Labs: No results for input(s): WBC, HGB, HCT, PLT, APTT, INR in the last 72 hours.   Recent Labs  12/17/14 1114 12/18/14 1002  NA 141 141  K 3.5 2.7*  CL 107 106  CO2 27 28  GLUCOSE 92 111*  BUN 19 18  CREATININE 1.07* 1.00  CALCIUM 10.7* 10.4*  MG 1.2* 1.2*  PHOS 2.3* 2.7   Estimated Creatinine Clearance: 65.6 mL/min (by C-G formula based on Cr of 1).    Recent Labs  12/17/14 2106 12/18/14 0429 12/18/14 0732  GLUCAP 97 109* 108*    Medical History: Past Medical History  Diagnosis Date  . Tracheostomy in place   . Diabetes   . CKD (chronic kidney disease)   . OSA (obstructive sleep apnea)   . HTN (hypertension)   . MDD (major depressive disorder)     Medications:  Scheduled:  . antiseptic oral rinse  7 mL Mouth Rinse QID  . apixaban  5 mg Oral BID  . chlorhexidine gluconate  15 mL Mouth Rinse BID  . Chlorhexidine Gluconate Cloth  6 each Topical Q0600  . ciprofloxacin  400 mg Intravenous Q12H  . escitalopram  5 mg Oral Daily  . feeding supplement (PRO-STAT SUGAR FREE 64)  30 mL Per Tube Daily  . feeding supplement (VITAL HIGH PROTEIN)  1,000 mL Per Tube Q24H  . free water  100 mL Per Tube 6 times per day  . insulin aspart  0-5 Units Subcutaneous QHS  . insulin aspart  0-9 Units Subcutaneous TID WC  . ipratropium-albuterol  3 mL Nebulization Q4H  . magnesium sulfate 1 - 4 g bolus IVPB  4 g Intravenous Once  . mupirocin ointment  1  application Nasal BID  . pantoprazole (PROTONIX) IV  40 mg Intravenous Q24H  . potassium chloride  10 mEq Intravenous Q1 Hr x 6  . QUEtiapine  50 mg Oral QHS  . senna-docusate  1 tablet Oral BID   Infusions:   PRN: acetaminophen **OR** acetaminophen, ALPRAZolam, alum & mag hydroxide-simeth, hydrALAZINE, HYDROcodone-acetaminophen, Influenza vac split quadrivalent PF, metoprolol, morphine injection, ondansetron **OR** ondansetron (ZOFRAN) IV, pneumococcal 23 valent vaccine   Assessment: Pharmacy consulted to manage electrolytes in this 69 y/o F with acute respiratory failure and sepsis.   Potassium and magnesium levels are low  Plan:  Will order KCL 10 meq x 6 and magnesium sulfate 4 g iv once. Will recheck potassium and magnesium at 2000 and replace again if necessary. BMET, mag, and phos ordered with AM labs.   Luisa Hart D 12/18/2014,11:36 AM

## 2014-12-18 NOTE — Consult Note (Signed)
[facility] Consultation      Date: 12/18/2014,   MRN# [id number] Rachel Brady May 14, 1945 Code Status:     Code Status Orders        Start     Ordered   12/13/14 1231  Full code   Continuous     12/13/14 1230     Hosp day:@LENGTHOFSTAYDAYS @ Referring MD: @     PCP:      AdmissionWeight: 211 lb (95.709 kg)                 CurrentWeight: 221 lb 9 oz (100.5 kg)      CHIEF COMPLAINT:  SOb and confusion  SOb  HISTORY OF PRESENT ILLNESS   Patient more alert and awake this AM, will wean PS today and attempt TCT,  wean fio2 as tolerated, Xanax as needed, s/p dubhoff tube for TFs Awaiting LTACH referral Sputum cultures pending   PAST MEDICAL HISTORY   Past Medical History  Diagnosis Date  . Tracheostomy in place   . Diabetes   . CKD (chronic kidney disease)   . OSA (obstructive sleep apnea)   . HTN (hypertension)   . MDD (major depressive disorder)      SURGICAL HISTORY   History reviewed. No pertinent past surgical history.   FAMILY HISTORY   History reviewed. No pertinent family history.   SOCIAL HISTORY   Social History  Substance Use Topics  . Smoking status: Never Smoker   . Smokeless tobacco: None  . Alcohol Use: None     MEDICATIONS    Home Medication:  No current outpatient prescriptions on file.  Current Medication:  Current facility-administered medications:  .  acetaminophen (TYLENOL) tablet 650 mg, 650 mg, Oral, Q6H PRN **OR** acetaminophen (TYLENOL) suppository 650 mg, 650 mg, Rectal, Q6H PRN, Adrian Saran, MD .  ALPRAZolam Prudy Feeler) tablet 0.5 mg, 0.5 mg, Oral, Q8H PRN, Erin Fulling, MD, 0.5 mg at 12/17/14 1744 .  alum & mag hydroxide-simeth (MAALOX/MYLANTA) 200-200-20 MG/5ML suspension 30 mL, 30 mL, Oral, Q6H PRN, Adrian Saran, MD .  antiseptic oral rinse solution (CORINZ), 7 mL, Mouth Rinse, QID, Adrian Saran, MD, 7 mL at 12/18/14 0347 .  apixaban (ELIQUIS) tablet 5 mg, 5 mg, Oral, BID, Adrian Saran,  MD, 5 mg at 12/17/14 2130 .  chlorhexidine gluconate (PERIDEX) 0.12 % solution 15 mL, 15 mL, Mouth Rinse, BID, Sital Mody, MD, 15 mL at 12/18/14 0848 .  Chlorhexidine Gluconate Cloth 2 % PADS 6 each, 6 each, Topical, Q0600, Delfino Lovett, MD, 6 each at 12/18/14 0537 .  ciprofloxacin (CIPRO) IVPB 400 mg, 400 mg, Intravenous, Q12H, Clydie Braun, MD, 400 mg at 12/18/14 0250 .  diazepam (VALIUM) injection 5 mg, 5 mg, Intravenous, Q6H PRN, Erin Fulling, MD, 5 mg at 12/17/14 2131 .  escitalopram (LEXAPRO) tablet 5 mg, 5 mg, Oral, Daily, Adrian Saran, MD, 5 mg at 12/17/14 1745 .  feeding supplement (PRO-STAT SUGAR FREE 64) liquid 30 mL, 30 mL, Per Tube, Daily, Erin Fulling, MD, 30 mL at 12/17/14 1747 .  feeding supplement (VITAL HIGH PROTEIN) liquid 1,000 mL, 1,000 mL, Per Tube, Q24H, Erin Fulling, MD, 1,000 mL at 12/17/14 1744 .  free water 100 mL, 100 mL, Per Tube, 6 times per day, Erin Fulling, MD, 100 mL at 12/18/14 0800 .  hydrALAZINE (APRESOLINE) injection 20 mg, 20 mg, Intravenous, Q4H PRN, Erin Fulling, MD .  HYDROcodone-acetaminophen (NORCO/VICODIN) 5-325 MG per tablet 1-2 tablet, 1-2 tablet, Oral, Q4H PRN, Adrian Saran, MD .  Influenza vac split quadrivalent PF (FLUARIX) injection 0.5 mL, 0.5 mL, Intramuscular, Prior to discharge, Delfino Lovett, MD .  insulin aspart (novoLOG) injection 0-5 Units, 0-5 Units, Subcutaneous, QHS, Adrian Saran, MD, 0 Units at 12/13/14 2149 .  insulin aspart (novoLOG) injection 0-9 Units, 0-9 Units, Subcutaneous, TID WC, Adrian Saran, MD, 1 Units at 12/16/14 1755 .  ipratropium-albuterol (DUONEB) 0.5-2.5 (3) MG/3ML nebulizer solution 3 mL, 3 mL, Nebulization, Q4H, Erin Fulling, MD, 3 mL at 12/18/14 0747 .  metoprolol (LOPRESSOR) injection 5 mg, 5 mg, Intravenous, Q3H PRN, Stephanie Acre, MD, 5 mg at 12/15/14 1703 .  morphine 2 MG/ML injection 2 mg, 2 mg, Intravenous, Q1H PRN, Erin Fulling, MD, 2 mg at 12/16/14 0544 .  mupirocin ointment (BACTROBAN) 2 % 1 application, 1 application,  Nasal, BID, Delfino Lovett, MD, 1 application at 12/17/14 2130 .  ondansetron (ZOFRAN) tablet 4 mg, 4 mg, Oral, Q6H PRN **OR** ondansetron (ZOFRAN) injection 4 mg, 4 mg, Intravenous, Q6H PRN, Adrian Saran, MD .  pantoprazole (PROTONIX) injection 40 mg, 40 mg, Intravenous, Q24H, Karl Ito, MD, 40 mg at 12/17/14 1925 .  pneumococcal 23 valent vaccine (PNU-IMMUNE) injection 0.5 mL, 0.5 mL, Intramuscular, Prior to discharge, Delfino Lovett, MD .  QUEtiapine (SEROQUEL) tablet 50 mg, 50 mg, Oral, QHS, Adrian Saran, MD, 50 mg at 12/17/14 2130 .  senna-docusate (Senokot-S) tablet 1 tablet, 1 tablet, Oral, BID PRN, Delfino Lovett, MD    ALLERGIES   Codeine     REVIEW OF SYSTEMS   Review of Systems  Constitutional: Positive for malaise/fatigue.  Eyes: Negative.   Respiratory: Negative for shortness of breath.   Cardiovascular: Negative.   Gastrointestinal: Negative.   Musculoskeletal: Negative.   Neurological: Negative for headaches.  Psychiatric/Behavioral: The patient is nervous/anxious.   All other systems reviewed and are negative.    VS: BP 121/71 mmHg  Pulse 91  Temp(Src) 99.3 F (37.4 C) (Core (Comment))  Resp 20  Ht 5\' 7"  (1.702 m)  Wt 221 lb 9 oz (100.5 kg)  BMI 34.69 kg/m2  SpO2 100%     PHYSICAL EXAM  Physical Exam  Constitutional: No distress.  S/p trach  HENT:  Head: Normocephalic and atraumatic.  Mouth/Throat: No oropharyngeal exudate.  Eyes: EOM are normal. Pupils are equal, round, and reactive to light. No scleral icterus.  Neck: Normal range of motion. Neck supple.  Cardiovascular: Normal rate, regular rhythm and normal heart sounds.   No murmur heard. Pulmonary/Chest: No stridor. No respiratory distress. She has no wheezes. She has rales.  Abdominal: Soft. Bowel sounds are normal. She exhibits no distension. There is no tenderness. There is no rebound.  Musculoskeletal: Normal range of motion. She exhibits no edema.  Neurological: She displays normal reflexes.  Coordination normal.  Lethargy and confusion  Skin: Skin is warm. She is not diaphoretic.  Psychiatric: She has a normal mood and affect.        LABS    Recent Labs     12/15/14  1048  12/17/14  1114  HGB  10.2*   --   HCT  33.0*   --   MCV  88.0   --   WBC  6.4   --   BUN  31*  19  CREATININE  1.37*  1.07*  GLUCOSE  132*  92  CALCIUM  11.3*  10.7*  ,    No results for input(s): PH in the last 72 hours.  Invalid input(s): PCO2, PO2, BASEEXCESS, BASEDEFICITE, TFT  CULTURE RESULTS   Recent Results (from the past 240 hour(s))  Blood Culture (routine x 2)     Status: None   Collection Time: 12/13/14  9:25 AM  Result Value Ref Range Status   Specimen Description BLOOD LEFT ARM  Final   Special Requests BOTTLES DRAWN AEROBIC AND ANAEROBIC  Final   Culture  Setup Time   Final    GRAM POSITIVE COCCI IN CLUSTERS ANAEROBIC BOTTLE ONLY CRITICAL RESULT CALLED TO, READ BACK BY AND VERIFIED WITH: LESLIE LEWIS 12/14/2014 0630 [facility] CONFIRMED BY [facility]    Culture   Final    COAGULASE NEGATIVE STAPHYLOCOCCUS ANAEROBIC BOTTLE ONLY Results consistent with contamination.    Report Status 12/18/2014 FINAL  Final  Blood Culture (routine x 2)     Status: None (Preliminary result)   Collection Time: 12/13/14  9:35 AM  Result Value Ref Range Status   Specimen Description BLOOD RIGHT FORARM  Final   Special Requests BOTTLES DRAWN AEROBIC AND ANAEROBIC  Final   Culture NO GROWTH 4 DAYS  Final   Report Status PENDING  Incomplete  Urine culture     Status: None   Collection Time: 12/13/14  9:35 AM  Result Value Ref Range Status   Specimen Description URINE, RANDOM  Final   Special Requests NONE  Final   Culture   Final    >=100,000 COLONIES/mL ESCHERICHIA COLI ESBL-EXTENDED SPECTRUM BETA LACTAMASE-THE ORGANISM IS RESISTANT TO PENICILLINS, CEPHALOSPORINS AND AZTREONAM ACCORDING TO CLSI M100-S15 VOL.25 N01 JAN 2005. Results Called to: Derald Macleod AT 1119 12/15/14  CTJ    Report Status 12/15/2014 FINAL  Final   Organism ID, Bacteria ESCHERICHIA COLI  Final      Susceptibility   Escherichia coli - MIC*    AMPICILLIN >=32 RESISTANT Resistant     CEFTAZIDIME 16 RESISTANT Resistant     CEFAZOLIN >=64 RESISTANT Resistant     CEFTRIAXONE 32 RESISTANT Resistant     GENTAMICIN <=1 SENSITIVE Sensitive     IMIPENEM <=0.25 SENSITIVE Sensitive     TRIMETH/SULFA >=320 RESISTANT Resistant     Extended ESBL POSITIVE Resistant     CIPROFLOXACIN Value in next row Sensitive      SENSITIVE<=0.25    NITROFURANTOIN Value in next row Sensitive      SENSITIVE<=16    PIP/TAZO Value in next row Resistant      RESISTANT>=128    ERTAPENEM Value in next row Sensitive      SENSITIVE<=0.5    * >=100,000 COLONIES/mL ESCHERICHIA COLI  MRSA PCR Screening     Status: Abnormal   Collection Time: 12/13/14 12:20 PM  Result Value Ref Range Status   MRSA by PCR POSITIVE (A) NEGATIVE Final    Comment:        The GeneXpert MRSA Assay (FDA approved for NASAL specimens only), is one component of a comprehensive MRSA colonization surveillance program. It is not intended to diagnose MRSA infection nor to guide or monitor treatment for MRSA infections. CRITICAL RESULT CALLED TO, READ BACK BY AND VERIFIED WITH: ADAM SCARBORO @1338  ON 12/13/14 BY HP   Culture, expectorated sputum-assessment     Status: None   Collection Time: 12/15/14 10:49 AM  Result Value Ref Range Status   Specimen Description ENDOTRACHEAL  Final   Special Requests NONE  Final   Sputum evaluation THIS SPECIMEN IS ACCEPTABLE FOR SPUTUM CULTURE  Final   Report Status 12/16/2014 FINAL  Final  Culture, respiratory (NON-Expectorated)     Status: None (Preliminary  result)   Collection Time: 12/15/14 10:49 AM  Result Value Ref Range Status   Specimen Description ENDOTRACHEAL  Final   Special Requests NONE Reflexed from [id number]  Final   Gram Stain   Final    FEW WBC SEEN FEW GRAM NEGATIVE RODS FEW GRAM  POSITIVE COCCI IN PAIRS FAIR SPECIMEN - 70-80% WBCS    Culture   Final    MODERATE GROWTH GRAM NEGATIVE RODS IDENTIFICATION AND SUSCEPTIBILITIES TO FOLLOW    Report Status PENDING  Incomplete          IMAGING    Dg Chest 1 View  12/13/2014   CLINICAL DATA:  Respiratory arrest twice this morning.  EXAM: CHEST  1 VIEW  COMPARISON:  08/02/2014.  FINDINGS: Stable enlarged cardiac silhouette and post CABG changes. Tracheostomy tube in satisfactory position. Dense airspace opacity in the lower half of the left lung. Patchy airspace opacity in the right mid and lower lung zones. Bilateral pleural effusions. Stable prosthetic heart valve and thoracic spine fixation hardware. Thoracic spine degenerative changes.  IMPRESSION: 1. Dense left basilar atelectasis or pneumonia. 2. Right mid and lower lung zone pneumonia or alveolar edema. 3. Cardiomegaly and bilateral pleural effusions.   Electronically Signed   By: Beckie Salts M.D.   On: 12/13/2014 10:17   Ct Head Wo Contrast  12/13/2014   CLINICAL DATA:  Patient presents to the ED via EMS from [facility]. Patient stopped breathing twice with EMS but after they have approx. 5 breaths with bag mask ventilation patient's spontaneous breathing returned. This occurred twice enroute to hospital. On arrival patient was hypotensive and responding to pain only. Patient breathing on her own at this time. Eval AMS  EXAM: CT HEAD WITHOUT CONTRAST  TECHNIQUE: Contiguous axial images were obtained from the base of the skull through the vertex without intravenous contrast.  COMPARISON:  04/03/2011  FINDINGS: There is moderate central and cortical atrophy. Periventricular white matter changes are consistent with small vessel disease. There is no intra or extra-axial fluid collection or mass lesion. The basilar cisterns and ventricles have a normal appearance. There is no CT evidence for acute infarction or hemorrhage. Bone windows show no acute findings. There is  atherosclerotic calcification of the internal carotid arteries.  IMPRESSION: 1. Atrophy and small vessel disease. 2.  No evidence for acute intracranial abnormality.   Electronically Signed   By: Norva Pavlov M.D.   On: 12/13/2014 10:56   Dg Chest Port 1 View  12/15/2014   CLINICAL DATA:  Respiratory distress  EXAM: PORTABLE CHEST - 1 VIEW  COMPARISON:  12/14/2014  FINDINGS: There is a tracheostomy tube in satisfactory position. There is bilateral interstitial and alveolar airspace opacities. There is a trace right pleural effusion. There is a moderate left pleural effusion with associated airspace disease likely reflecting atelectasis. There is prominence of the central pulmonary vasculature. There is no pneumothorax. There is stable cardiomegaly. There is evidence of prior CABG. There is no acute osseous abnormality.  IMPRESSION: 1. Overall findings most consistent with CHF. 2. Moderate left pleural effusion with associated airspace disease likely reflecting atelectasis.   Electronically Signed   By: Elige Ko   On: 12/15/2014 09:20   Portable Chest 1 View  12/14/2014   CLINICAL DATA:  69 year old female with respiratory failure, sepsis. Initial encounter.  EXAM: PORTABLE CHEST - 1 VIEW  COMPARISON:  12/13/2014 and earlier.  FINDINGS: Portable AP semi upright view at 0555 hours. Stable tracheostomy tube. Stable severe cardiomegaly. Other mediastinal  contours are stable, suggestion of some central pulmonary artery enlargement. Mildly decreased pulmonary vascularity. No pneumothorax. Continued dense retrocardiac opacity. Less veiling opacity at the right lung base. Chronic bilateral lateral rib fractures.  IMPRESSION: 1. Severe cardiomegaly with dense lower lobe collapse or consolidation greater on the left. 2. Mildly decreased pulmonary vascularity and veiling opacity at the right lung base suggesting some improvement in pulmonary edema and right pleural effusion.   Electronically Signed   By: Odessa Fleming  M.D.   On: 12/14/2014 07:57   Dg Abd Portable 1v  12/17/2014   CLINICAL DATA:  NG tube placement.  EXAM: PORTABLE ABDOMEN - 1 VIEW  COMPARISON:  None.  FINDINGS: Feeding tube tip is in the fundus of the stomach. Chronic pleural effusions and basilar atelectasis. Moderate air in the nondistended colon.  IMPRESSION: Feeding tube tip is in the fundus of the stomach.   Electronically Signed   By: Francene Boyers M.D.   On: 12/17/2014 14:55   MAJOR EVENTS/TEST RESULTS: Admitted 9/4 for resp failure and sepsis 9/5 placed back on vent likely mucus plugs   INDWELLING DEVICES::  MICRO DATA: MRSA PCR >>+MRSA Urine >> E. Coli(ESBL) Blood>> Resp >>gram neg rods>>  ANTIMICROBIALS:  Zosyn 9/4>>9/6 Vancomycin 9/4>>9/6 cipro 9/6>>     ASSESSMENT/PLAN    69 yo white female with acute resp failure from acute encephalopathy from acute UTI and GPC bacteremia with sepsis, now with resp failure placed back on vent likley due to mucus plugs/pneumonia-sputum culture pending  1.Respiratory Failure -continue Full MV support as needed-will wean PS mode and wean to TCT today as tolerated -continue Bronchodilator Therapy  2.UTI sepsis ESBL -continue abx  3.sputum culture Plan to wean off vent as tolerated   I have personally obtained a history, examined the patient, evaluated laboratory and independently reviewed imaging results, formulated the assessment and plan and placed orders.  The Patient requires high complexity decision making for assessment and support, frequent evaluation and titration of therapies, application of advanced monitoring technologies and extensive interpretation of multiple databases. Time spent with patient 35 minutes. Recommend LTACH referral    Chelcea Zahn Santiago Glad, M.D.  [facility]  Medical Director [facility] Medical Director [facility] Cardio-Pulmonary Department

## 2014-12-18 NOTE — Progress Notes (Signed)
Nutrition Follow-up     INTERVENTION:   EN: recommend titrating TF as tolerated to goal rate as per order set; continue to assess; pt on constipation prevention protocol, electrolyte protocol   NUTRITION DIAGNOSIS:   Inadequate oral intake related to acute illness as evidenced by NPO status.  GOAL:   Patient will meet greater than or equal to 90% of their needs  MONITOR:    (Energy Intake, Pulmonary, Digestive System, Anthropometrics, Electrolyte/Renal Profile, Glucose Profile)  ASSESSMENT:    Pt remains on vent via trach, sedated after receiving zanax during the night.  Noted insurance as denied LTAC transfer  Diet Order:  Diet NPO time specified   EN: spoke with Dois Davenport RN, pt tolearting Vital high protein at rate of 25 ml/hr via dobhoff tube  Digestive System: xray confirmed dobhoff in stomach yesterday; no signs of TF intolerance, +smear BM  Skin:   (stage II buttock)   Electrolyte and Renal Profile:  Recent Labs Lab 12/15/14 1048 12/17/14 1114 12/18/14 1002  BUN 31* 19 18  CREATININE 1.37* 1.07* 1.00  NA 140 141 141  K 4.0 3.5 2.7*  MG 1.6* 1.2* 1.2*  PHOS 3.6 2.3* 2.7   Glucose Profile:  Recent Labs  12/18/14 0429 12/18/14 0732 12/18/14 1156  GLUCAP 109* 108* 121*   Nutritional Anemia Profile:  CBC Latest Ref Rng 12/15/2014 12/14/2014 12/13/2014  WBC 3.6 - 11.0 K/uL 6.4 6.9 8.9  Hemoglobin 12.0 - 16.0 g/dL 10.2(L) 8.8(L) 9.7(L)  Hematocrit 35.0 - 47.0 % 33.0(L) 27.5(L) 31.7(L)  Platelets 150 - 440 K/uL 127(L) 136(L) 169    Meds: reviewed  Height:   Ht Readings from Last 1 Encounters:  12/13/14  (1.702 m)    Weight:   Wt Readings from Last 1 Encounters:  12/18/14 221 lb 9 oz (100.5 kg)    BMI:  Body mass index is 34.69 kg/(m^2).  Estimated Nutritional Needs:   Kcal:  4098-1191 kcals (11-14 kcals/kg)   Protein:  122-153 g (2.0-2.5 g/kg) using IBW 61 kg  Fluid:  1525-1830 mL (25-30 ml/kg)   HIGH Care Level  Romelle Starcher MS, RD,  LDN [phone] Pager

## 2014-12-18 NOTE — Progress Notes (Signed)
Pt.'s Sa02 dropped in the 70s. Her work of breathing increased and she had a small mucous plug which was suctioned out. She was placed back on PRVC on a rate of 15 for the increased WOB.

## 2014-12-18 NOTE — Progress Notes (Signed)
ANTIBIOTIC CONSULT NOTE - INITIAL  Pharmacy Consult for Meropenm Indication: ESBL UTI/Pseudomonas PNA  Allergies  Allergen Reactions  . Codeine     Has tolerated oxycodone in the past.    Patient Measurements: Height: 5\' 7"  (170.2 cm) Weight: 221 lb 9 oz (100.5 kg) IBW/kg (Calculated) : 61.6   Vital Signs: Temp: 99.3 F (37.4 C) (09/09 1100) BP: 140/92 mmHg (09/09 1100) Pulse Rate: 89 (09/09 1100) Intake/Output from previous day: 09/08 0701 - 09/09 0700 In: 966.7 [I.V.:660; NG/GT:106.7; IV Piggyback:200] Out: 1000 [Urine:1000] Intake/Output from this shift:    Labs:  Recent Labs  12/17/14 1114 12/18/14 1002  CREATININE 1.07* 1.00   Estimated Creatinine Clearance: 65.6 mL/min (by C-G formula based on Cr of 1). No results for input(s): VANCOTROUGH, VANCOPEAK, VANCORANDOM, GENTTROUGH, GENTPEAK, GENTRANDOM, TOBRATROUGH, TOBRAPEAK, TOBRARND, AMIKACINPEAK, AMIKACINTROU, AMIKACIN in the last 72 hours.   Microbiology: Recent Results (from the past 720 hour(s))  Blood Culture (routine x 2)     Status: None   Collection Time: 12/13/14  9:25 AM  Result Value Ref Range Status   Specimen Description BLOOD LEFT ARM  Final   Special Requests BOTTLES DRAWN AEROBIC AND ANAEROBIC  Final   Culture  Setup Time   Final    GRAM POSITIVE COCCI IN CLUSTERS ANAEROBIC BOTTLE ONLY CRITICAL RESULT CALLED TO, READ BACK BY AND VERIFIED WITH: LESLIE LEWIS 12/14/2014 0630 [facility] CONFIRMED BY [facility]    Culture   Final    COAGULASE NEGATIVE STAPHYLOCOCCUS ANAEROBIC BOTTLE ONLY Results consistent with contamination.    Report Status 12/18/2014 FINAL  Final  Blood Culture (routine x 2)     Status: None (Preliminary result)   Collection Time: 12/13/14  9:35 AM  Result Value Ref Range Status   Specimen Description BLOOD RIGHT FORARM  Final   Special Requests BOTTLES DRAWN AEROBIC AND ANAEROBIC  Final   Culture NO GROWTH 4 DAYS  Final   Report Status PENDING  Incomplete  Urine culture      Status: None   Collection Time: 12/13/14  9:35 AM  Result Value Ref Range Status   Specimen Description URINE, RANDOM  Final   Special Requests NONE  Final   Culture   Final    >=100,000 COLONIES/mL ESCHERICHIA COLI ESBL-EXTENDED SPECTRUM BETA LACTAMASE-THE ORGANISM IS RESISTANT TO PENICILLINS, CEPHALOSPORINS AND AZTREONAM ACCORDING TO CLSI M100-S15 VOL.25 N01 JAN 2005. Results Called to: Derald Macleod AT 1119 12/15/14 CTJ    Report Status 12/15/2014 FINAL  Final   Organism ID, Bacteria ESCHERICHIA COLI  Final      Susceptibility   Escherichia coli - MIC*    AMPICILLIN >=32 RESISTANT Resistant     CEFTAZIDIME 16 RESISTANT Resistant     CEFAZOLIN >=64 RESISTANT Resistant     CEFTRIAXONE 32 RESISTANT Resistant     GENTAMICIN <=1 SENSITIVE Sensitive     IMIPENEM <=0.25 SENSITIVE Sensitive     TRIMETH/SULFA >=320 RESISTANT Resistant     Extended ESBL POSITIVE Resistant     CIPROFLOXACIN Value in next row Sensitive      SENSITIVE<=0.25    NITROFURANTOIN Value in next row Sensitive      SENSITIVE<=16    PIP/TAZO Value in next row Resistant      RESISTANT>=128    ERTAPENEM Value in next row Sensitive      SENSITIVE<=0.5    * >=100,000 COLONIES/mL ESCHERICHIA COLI  MRSA PCR Screening     Status: Abnormal   Collection Time: 12/13/14 12:20 PM  Result Value  Ref Range Status   MRSA by PCR POSITIVE (A) NEGATIVE Final    Comment:        The GeneXpert MRSA Assay (FDA approved for NASAL specimens only), is one component of a comprehensive MRSA colonization surveillance program. It is not intended to diagnose MRSA infection nor to guide or monitor treatment for MRSA infections. CRITICAL RESULT CALLED TO, READ BACK BY AND VERIFIED WITH: ADAM SCARBORO  ON 12/13/14 BY HP   Culture, expectorated sputum-assessment     Status: None   Collection Time: 12/15/14 10:49 AM  Result Value Ref Range Status   Specimen Description ENDOTRACHEAL  Final   Special Requests NONE  Final    Sputum evaluation THIS SPECIMEN IS ACCEPTABLE FOR SPUTUM CULTURE  Final   Report Status 12/16/2014 FINAL  Final  Culture, respiratory (NON-Expectorated)     Status: None (Preliminary result)   Collection Time: 12/15/14 10:49 AM  Result Value Ref Range Status   Specimen Description ENDOTRACHEAL  Final   Special Requests NONE Reflexed from [id number]  Final   Gram Stain   Final    FEW WBC SEEN FEW GRAM NEGATIVE RODS FEW GRAM POSITIVE COCCI IN PAIRS FAIR SPECIMEN - 70-80% WBCS    Culture   Final    MODERATE GROWTH CITROBACTER SPECIES HEAVY GROWTH PSEUDOMONAS AERUGINOSA SUSCEPTIBILITIES TO FOLLOW FOR ORGANISM 1 MULTI-DRUG RESISTANT ORGANISM CRITICAL RESULT CALLED TO, READ BACK BY AND VERIFIED WITH: SANDRA BORBA 12/18/14 1042 JGF    Report Status PENDING  Incomplete   Organism ID, Bacteria PSEUDOMONAS AERUGINOSA  Final      Susceptibility   Pseudomonas aeruginosa - MIC*    CEFTAZIDIME >=64 RESISTANT Resistant     CIPROFLOXACIN >=4 RESISTANT Resistant     GENTAMICIN >=16 RESISTANT Resistant     IMIPENEM 1 SENSITIVE Sensitive     * HEAVY GROWTH PSEUDOMONAS AERUGINOSA    Medical History: Past Medical History  Diagnosis Date  . Tracheostomy in place   . Diabetes   . CKD (chronic kidney disease)   . OSA (obstructive sleep apnea)   . HTN (hypertension)   . MDD (major depressive disorder)     Medications:  Scheduled:  . antiseptic oral rinse  7 mL Mouth Rinse QID  . apixaban  5 mg Oral BID  . chlorhexidine gluconate  15 mL Mouth Rinse BID  . Chlorhexidine Gluconate Cloth  6 each Topical Q0600  . escitalopram  5 mg Oral Daily  . feeding supplement (PRO-STAT SUGAR FREE 64)  30 mL Per Tube Daily  . feeding supplement (VITAL HIGH PROTEIN)  1,000 mL Per Tube Q24H  . free water  100 mL Per Tube 6 times per day  . insulin aspart  0-5 Units Subcutaneous QHS  . insulin aspart  0-9 Units Subcutaneous TID WC  . ipratropium-albuterol  3 mL Nebulization Q4H  . magnesium sulfate 1 - 4 g bolus  IVPB  4 g Intravenous Once  . meropenem (MERREM) IV  1 g Intravenous 3 times per day  . mupirocin ointment  1 application Nasal BID  . pantoprazole (PROTONIX) IV  40 mg Intravenous Q24H  . potassium chloride  10 mEq Intravenous Q1 Hr x 6  . QUEtiapine  50 mg Oral QHS  . senna-docusate  1 tablet Oral BID   Infusions:   PRN: acetaminophen **OR** acetaminophen, ALPRAZolam, alum & mag hydroxide-simeth, hydrALAZINE, HYDROcodone-acetaminophen, Influenza vac split quadrivalent PF, metoprolol, morphine injection, ondansetron **OR** ondansetron (ZOFRAN) IV, pneumococcal 23 valent vaccine  Assessment: 69 y/o F with  acute respiratory failure on ciprofloxacin for ESBL E coli UTI. Tracheal aspirate culture growing Pseudomonas that is resistant to ciprofloxacin.   Plan:  Will discontinue ciprofloxacin and begin meropenem 1 g iv q 8 hours.   Rachel Brady 12/18/2014,11:47 AM

## 2014-12-18 NOTE — Progress Notes (Signed)
Pharmacy Consult for Electrolyte Management   Allergies  Allergen Reactions  . Codeine     Has tolerated oxycodone in the past.    Patient Measurements: Height:  (170.2 cm) Weight: 221 lb 9 oz (100.5 kg) IBW/kg (Calculated) : 61.6  Vital Signs: Temp: 98.6 F (37 C) (09/09 2000) BP: 148/100 mmHg (09/09 2000) Pulse Rate: 89 (09/09 2000) Intake/Output from previous day: 09/08 0701 - 09/09 0700 In: 966.7 [I.V.:660; NG/GT:106.7; IV Piggyback:200] Out: 1000 [Urine:1000] Intake/Output from this shift: Total I/O In: 140 [NG/GT:140] Out: 50 [Urine:50]  Labs: No results for input(s): WBC, HGB, HCT, PLT, APTT, INR in the last 72 hours.   Recent Labs  12/17/14 1114 12/18/14 1002 12/18/14 1954  NA 141 141  --   K 3.5 2.7* 3.3*  CL 107 106  --   CO2 27 28  --   GLUCOSE 92 111*  --   BUN 19 18  --   CREATININE 1.07* 1.00  --   CALCIUM 10.7* 10.4*  --   MG 1.2* 1.2* 1.9  PHOS 2.3* 2.7  --    Estimated Creatinine Clearance: 65.6 mL/min (by C-G formula based on Cr of 1).    Recent Labs  12/18/14 1603 12/18/14 1756 12/18/14 1947  GLUCAP 110* 124* 112*    Medical History: Past Medical History  Diagnosis Date  . Tracheostomy in place   . Diabetes   . CKD (chronic kidney disease)   . OSA (obstructive sleep apnea)   . HTN (hypertension)   . MDD (major depressive disorder)     Medications:  Scheduled:  . antiseptic oral rinse  7 mL Mouth Rinse QID  . apixaban  5 mg Oral BID  . chlorhexidine gluconate  15 mL Mouth Rinse BID  . Chlorhexidine Gluconate Cloth  6 each Topical Q0600  . escitalopram  5 mg Oral Daily  . feeding supplement (PRO-STAT SUGAR FREE 64)  30 mL Per Tube Daily  . feeding supplement (VITAL HIGH PROTEIN)  1,000 mL Per Tube Q24H  . free water  100 mL Per Tube 6 times per day  . insulin aspart  0-5 Units Subcutaneous QHS  . insulin aspart  0-9 Units Subcutaneous TID WC  . ipratropium-albuterol  3 mL Nebulization Q4H  . meropenem (MERREM) IV   1 g Intravenous 3 times per day  . mupirocin ointment  1 application Nasal BID  . pantoprazole sodium  40 mg Per Tube Daily  . potassium chloride  10 mEq Intravenous Q1 Hr x 4  . QUEtiapine  50 mg Oral QHS  . senna-docusate  1 tablet Oral BID   Infusions:   PRN: acetaminophen **OR** acetaminophen, ALPRAZolam, alum & mag hydroxide-simeth, hydrALAZINE, HYDROcodone-acetaminophen, Influenza vac split quadrivalent PF, metoprolol, morphine injection, ondansetron **OR** ondansetron (ZOFRAN) IV, pneumococcal 23 valent vaccine   Assessment: Pharmacy consulted to manage electrolytes in this 69 y/o F with acute respiratory failure and sepsis.   Potassium and magnesium levels are low  Plan:  Will order KCL 10 meq x 6 and magnesium sulfate 4 g iv once. Will recheck potassium and magnesium at 2000 and replace again if necessary. BMET, mag, and phos ordered with AM labs.   9/9:  K @ 20:00 = 3.3 Will order KCl 40 mEq IV X 1 and recheck K on 9/10 with AM labs.   Rachel Brady D 12/18/2014,8:47 PM

## 2014-12-18 NOTE — Care Management Note (Signed)
Case Management Note  Patient Details  Name: BELLA BRUMMET MRN: [id number] Date of Birth: 06/03/1945  Subjective/Objective:   Insurance has denied LTACH. Will proceed with returning to SNF. CSW updated. Primary nurse updated. Will contact son.                 Action/Plan:   Expected Discharge Date:                  Expected Discharge Plan:  Skilled Nursing Facility  In-House Referral:  Clinical Social Work  Discharge planning Services  CM Consult  Post Acute Care Choice:  Long Term Acute Care (LTAC) Choice offered to:  Adult Children  DME Arranged:    DME Agency:     HH Arranged:    HH Agency:     Status of Service:  In process, will continue to follow  Medicare Important Message Given:  Yes-second notification given Date Medicare IM Given:    Medicare IM give by:    Date Additional Medicare IM Given:    Additional Medicare Important Message give by:     If discussed at Long Length of Stay Meetings, dates discussed:    Additional Comments:  Marily Memos, RN 12/18/2014, 12:31 PM

## 2014-12-18 NOTE — Progress Notes (Addendum)
PHARMACY - CRITICAL CARE PROGRESS NOTE  Pharmacy Consult for Constipation Prevention   Allergies  Allergen Reactions  . Codeine     Has tolerated oxycodone in the past.    Patient Measurements: Height:  (170.2 cm) Weight: 221 lb 9 oz (100.5 kg) IBW/kg (Calculated) : 61.6  Vital Signs: Temp: 99.3 F (37.4 C) (09/09 0842) BP: 121/71 mmHg (09/09 0842) Pulse Rate: 91 (09/09 0842) Intake/Output from previous day: 09/08 0701 - 09/09 0700 In: 966.7 [I.V.:660; NG/GT:106.7; IV Piggyback:200] Out: 1000 [Urine:1000] Intake/Output from this shift:   Vent settings for last 24 hours: Vent Mode:  [-] PSV FiO2 (%):  [30 %] 30 % PEEP:  [5 cmH20] 5 cmH20 Pressure Support:  [8 cmH20-15 cmH20] 8 cmH20  Labs:  Recent Labs  12/17/14 1114 12/18/14 1002  CREATININE 1.07* 1.00  MG 1.2* 1.2*  PHOS 2.3* 2.7   Estimated Creatinine Clearance: 65.6 mL/min (by C-G formula based on Cr of 1).   Recent Labs  12/17/14 2106 12/18/14 0429 12/18/14 0732  GLUCAP 97 109* 108*    Microbiology: Recent Results (from the past 720 hour(s))  Blood Culture (routine x 2)     Status: None   Collection Time: 12/13/14  9:25 AM  Result Value Ref Range Status   Specimen Description BLOOD LEFT ARM  Final   Special Requests BOTTLES DRAWN AEROBIC AND ANAEROBIC  Final   Culture  Setup Time   Final    GRAM POSITIVE COCCI IN CLUSTERS ANAEROBIC BOTTLE ONLY CRITICAL RESULT CALLED TO, READ BACK BY AND VERIFIED WITH: LESLIE LEWIS 12/14/2014 0630 [facility] CONFIRMED BY [facility]    Culture   Final    COAGULASE NEGATIVE STAPHYLOCOCCUS ANAEROBIC BOTTLE ONLY Results consistent with contamination.    Report Status 12/18/2014 FINAL  Final  Blood Culture (routine x 2)     Status: None (Preliminary result)   Collection Time: 12/13/14  9:35 AM  Result Value Ref Range Status   Specimen Description BLOOD RIGHT FORARM  Final   Special Requests BOTTLES DRAWN AEROBIC AND ANAEROBIC  Final   Culture NO GROWTH 4 DAYS   Final   Report Status PENDING  Incomplete  Urine culture     Status: None   Collection Time: 12/13/14  9:35 AM  Result Value Ref Range Status   Specimen Description URINE, RANDOM  Final   Special Requests NONE  Final   Culture   Final    >=100,000 COLONIES/mL ESCHERICHIA COLI ESBL-EXTENDED SPECTRUM BETA LACTAMASE-THE ORGANISM IS RESISTANT TO PENICILLINS, CEPHALOSPORINS AND AZTREONAM ACCORDING TO CLSI M100-S15 VOL.25 N01 JAN 2005. Results Called to: Derald Macleod AT 1119 12/15/14 CTJ    Report Status 12/15/2014 FINAL  Final   Organism ID, Bacteria ESCHERICHIA COLI  Final      Susceptibility   Escherichia coli - MIC*    AMPICILLIN >=32 RESISTANT Resistant     CEFTAZIDIME 16 RESISTANT Resistant     CEFAZOLIN >=64 RESISTANT Resistant     CEFTRIAXONE 32 RESISTANT Resistant     GENTAMICIN <=1 SENSITIVE Sensitive     IMIPENEM <=0.25 SENSITIVE Sensitive     TRIMETH/SULFA >=320 RESISTANT Resistant     Extended ESBL POSITIVE Resistant     CIPROFLOXACIN Value in next row Sensitive      SENSITIVE<=0.25    NITROFURANTOIN Value in next row Sensitive      SENSITIVE<=16    PIP/TAZO Value in next row Resistant      RESISTANT>=128    ERTAPENEM Value in next row Sensitive  SENSITIVE<=0.5    * >=100,000 COLONIES/mL ESCHERICHIA COLI  MRSA PCR Screening     Status: Abnormal   Collection Time: 12/13/14 12:20 PM  Result Value Ref Range Status   MRSA by PCR POSITIVE (A) NEGATIVE Final    Comment:        The GeneXpert MRSA Assay (FDA approved for NASAL specimens only), is one component of a comprehensive MRSA colonization surveillance program. It is not intended to diagnose MRSA infection nor to guide or monitor treatment for MRSA infections. CRITICAL RESULT CALLED TO, READ BACK BY AND VERIFIED WITH: ADAM SCARBORO @1338  ON 12/13/14 BY HP   Culture, expectorated sputum-assessment     Status: None   Collection Time: 12/15/14 10:49 AM  Result Value Ref Range Status   Specimen  Description ENDOTRACHEAL  Final   Special Requests NONE  Final   Sputum evaluation THIS SPECIMEN IS ACCEPTABLE FOR SPUTUM CULTURE  Final   Report Status 12/16/2014 FINAL  Final  Culture, respiratory (NON-Expectorated)     Status: None (Preliminary result)   Collection Time: 12/15/14 10:49 AM  Result Value Ref Range Status   Specimen Description ENDOTRACHEAL  Final   Special Requests NONE Reflexed from [id number]  Final   Gram Stain   Final    FEW WBC SEEN FEW GRAM NEGATIVE RODS FEW GRAM POSITIVE COCCI IN PAIRS FAIR SPECIMEN - 70-80% WBCS    Culture   Final    MODERATE GROWTH CITROBACTER SPECIES HEAVY GROWTH PSEUDOMONAS AERUGINOSA SUSCEPTIBILITIES TO FOLLOW FOR ORGANISM 1 MULTI-DRUG RESISTANT ORGANISM CRITICAL RESULT CALLED TO, READ BACK BY AND VERIFIED WITH: SANDRA BORBA 12/18/14 1042 JGF    Report Status PENDING  Incomplete   Organism ID, Bacteria PSEUDOMONAS AERUGINOSA  Final      Susceptibility   Pseudomonas aeruginosa - MIC*    CEFTAZIDIME >=64 RESISTANT Resistant     CIPROFLOXACIN >=4 RESISTANT Resistant     GENTAMICIN >=16 RESISTANT Resistant     IMIPENEM 1 SENSITIVE Sensitive     * HEAVY GROWTH PSEUDOMONAS AERUGINOSA    Medications:  Scheduled:  . antiseptic oral rinse  7 mL Mouth Rinse QID  . apixaban  5 mg Oral BID  . chlorhexidine gluconate  15 mL Mouth Rinse BID  . Chlorhexidine Gluconate Cloth  6 each Topical Q0600  . ciprofloxacin  400 mg Intravenous Q12H  . escitalopram  5 mg Oral Daily  . feeding supplement (PRO-STAT SUGAR FREE 64)  30 mL Per Tube Daily  . feeding supplement (VITAL HIGH PROTEIN)  1,000 mL Per Tube Q24H  . free water  100 mL Per Tube 6 times per day  . insulin aspart  0-5 Units Subcutaneous QHS  . insulin aspart  0-9 Units Subcutaneous TID WC  . ipratropium-albuterol  3 mL Nebulization Q4H  . magnesium sulfate 1 - 4 g bolus IVPB  4 g Intravenous Once  . mupirocin ointment  1 application Nasal BID  . pantoprazole (PROTONIX) IV  40 mg  Intravenous Q24H  . potassium chloride  10 mEq Intravenous Q1 Hr x 6  . QUEtiapine  50 mg Oral QHS  . senna-docusate  1 tablet Oral BID   Infusions:    PRN: acetaminophen **OR** acetaminophen, ALPRAZolam, alum & mag hydroxide-simeth, hydrALAZINE, HYDROcodone-acetaminophen, Influenza vac split quadrivalent PF, metoprolol, morphine injection, ondansetron **OR** ondansetron (ZOFRAN) IV, pneumococcal 23 valent vaccine  Assessment: 69 y/o F with acute respiratory failure, sepsis, ESBL UTI.   Plan:  Patient remains without BM since 9/5 so will scheduled senna/docusate. Will f/u  AM.   Rachel Brady D 12/18/2014,11:34 AM

## 2014-12-18 NOTE — Progress Notes (Signed)
[facility] - [place] at [facility]   PATIENT NAME: Rachel Brady    MR#:  [id number]  DATE OF BIRTH:  11-28-1945  SUBJECTIVE:  CHIEF COMPLAINT:   Chief Complaint  Patient presents with  . Respiratory Arrest  off vent, on trach collar. Sputum growing Pseudomonas, sleepy. K 2.7 REVIEW OF SYSTEMS:  Review of Systems  Unable to perform ROS: critical illness   DRUG ALLERGIES:   Allergies  Allergen Reactions  . Codeine     Has tolerated oxycodone in the past.   VITALS:  Blood pressure 140/92, pulse 94, temperature 99.5 F (37.5 C), temperature source Core (Comment), resp. rate 24, height 5\' 7"  (1.702 m), weight 221 lb 9 oz (100.5 kg), SpO2 100 %. PHYSICAL EXAMINATION:  Physical Exam  Constitutional: She is oriented to person, place, and time and well-developed, well-nourished, and in no distress.  HENT:  Head: Normocephalic and atraumatic.  Trach in place  Eyes: Conjunctivae and EOM are normal. Pupils are equal, round, and reactive to light.  Neck: Normal range of motion. Neck supple. No tracheal deviation present. No thyromegaly present.  Cardiovascular: Normal rate, regular rhythm and normal heart sounds.   Pulmonary/Chest: Effort normal and breath sounds normal. No respiratory distress. She has no wheezes. She exhibits no tenderness.  Abdominal: Soft. Bowel sounds are normal. She exhibits no distension. There is no tenderness.  Musculoskeletal: Normal range of motion.  Neurological: She is alert and oriented to person, place, and time. No cranial nerve deficit.  Skin: Skin is warm and dry. No rash noted.  Psychiatric: Mood and affect normal.   LABORATORY PANEL:   CBC  Recent Labs Lab 12/15/14 1048  WBC 6.4  HGB 10.2*  HCT 33.0*  PLT 127*   ------------------------------------------------------------------------------------------------------------------ Chemistries   Recent Labs Lab 12/13/14 0925  12/18/14 1002  NA 141  < > 141  K  4.9  < > 2.7*  CL 102  < > 106  CO2 32  < > 28  GLUCOSE 180*  < > 111*  BUN 34*  < > 18  CREATININE 1.26*  < > 1.00  CALCIUM 11.9*  < > 10.4*  MG  --   < > 1.2*  AST 32  --   --   ALT 87*  --   --   ALKPHOS 128*  --   --   BILITOT 0.5  --   --   < > = values in this interval not displayed. RADIOLOGY:  Dg Abd Portable 1v  12/17/2014   CLINICAL DATA:  NG tube placement.  EXAM: PORTABLE ABDOMEN - 1 VIEW  COMPARISON:  None.  FINDINGS: Feeding tube tip is in the fundus of the stomach. Chronic pleural effusions and basilar atelectasis. Moderate air in the nondistended colon.  IMPRESSION: Feeding tube tip is in the fundus of the stomach.   Electronically Signed   By: Francene Boyers M.D.   On: 12/17/2014 14:55   ASSESSMENT AND PLAN:  This is a 69 year old female with a history of tracheostomy, atrial fibrillation, diabetes who presents with sepsis.  1. Sepsis with hypotension: secondary to urinary tract infection. Appreciate ID input. Abx changed to Meropenem  2. ESBL E.coli Urinary tract infection: changed to meropenem  Appreciate infectious disease input.    3. Healthcare acquired pneumonia: CCM & ID following, trach suction sputum growing Pseudomonas & Citrobacter  4. Acute respiratory failure: on TCT, wean fio2 as tolerated  5. Atrial fibrillation: Her heart rate is well controlled at  this time  6. Diabetes: on tube feeds and tolerating, sliding scale insulin for now.  7. Depression/anxiety: If able to give these medications via NG tube we will continue these.  8. Fungal skin infection: continue nystatin.   All the records are reviewed and case discussed with Care Management/Social Worker. Management plans discussed with the patient, family and they are in agreement.  CODE STATUS: Full code  TOTAL TIME (CRITICAL CARE) TAKING CARE OF THIS PATIENT: 35 minutes.   More than 50% of the time was spent in counseling/coordination of care: YES  Did peer-peer Review with [employer] but they  declined - they recommend appeal if CM/SW would like.  [facility], Camile Esters M.D on 12/18/2014 at 2:21 PM  Between 7am to 6pm - Pager - [phone]  After 6pm go to [email or web address] - password [email or web address] [facility]  [facility] [place] [facility]  Office  [phone]  CC: Primary care physician; Dorothey Baseman, MD

## 2014-12-19 ENCOUNTER — Inpatient Hospital Stay: Payer: Medicare Other

## 2014-12-19 DIAGNOSIS — A4151 Sepsis due to Escherichia coli [E. coli]: Principal | ICD-10-CM

## 2014-12-19 DIAGNOSIS — J0410 Acute tracheitis without obstruction: Secondary | ICD-10-CM

## 2014-12-19 DIAGNOSIS — J9621 Acute and chronic respiratory failure with hypoxia: Secondary | ICD-10-CM

## 2014-12-19 DIAGNOSIS — R0600 Dyspnea, unspecified: Secondary | ICD-10-CM

## 2014-12-19 LAB — CULTURE, RESPIRATORY

## 2014-12-19 LAB — BASIC METABOLIC PANEL
ANION GAP: 4 — AB (ref 5–15)
BUN: 19 mg/dL (ref 6–20)
CHLORIDE: 106 mmol/L (ref 101–111)
CO2: 30 mmol/L (ref 22–32)
Calcium: 10.7 mg/dL — ABNORMAL HIGH (ref 8.9–10.3)
Creatinine, Ser: 0.97 mg/dL (ref 0.44–1.00)
GFR calc Af Amer: >60 mL/min (ref >60)
GFR, EST NON AFRICAN AMERICAN: 59 mL/min — AB (ref >60)
GLUCOSE: 116 mg/dL — AB (ref 65–99)
POTASSIUM: 3.6 mmol/L (ref 3.5–5.1)
Sodium: 140 mmol/L (ref 135–145)

## 2014-12-19 LAB — CULTURE, RESPIRATORY W GRAM STAIN

## 2014-12-19 LAB — GLUCOSE, CAPILLARY
GLUCOSE-CAPILLARY: 111 mg/dL — AB (ref 65–99)
GLUCOSE-CAPILLARY: 121 mg/dL — AB (ref 65–99)
GLUCOSE-CAPILLARY: 88 mg/dL (ref 65–99)
GLUCOSE-CAPILLARY: 99 mg/dL (ref 65–99)
Glucose-Capillary: 131 mg/dL — ABNORMAL HIGH (ref 65–99)
Glucose-Capillary: 130 mg/dL — ABNORMAL HIGH (ref 65–99)

## 2014-12-19 LAB — PHOSPHORUS: Phosphorus: 2.5 mg/dL (ref 2.5–4.6)

## 2014-12-19 LAB — HEMOGLOBIN: HEMOGLOBIN: 9.2 g/dL — AB (ref 12.0–16.0)

## 2014-12-19 LAB — MAGNESIUM: Magnesium: 1.8 mg/dL (ref 1.7–2.4)

## 2014-12-19 MED ORDER — MIDAZOLAM HCL 2 MG/2ML IJ SOLN
2.0000 mg | Freq: Once | INTRAMUSCULAR | Status: AC
  Administered 2014-12-19: 2 mg via INTRAVENOUS
  Filled 2014-12-19: qty 2

## 2014-12-19 MED ORDER — VITAL HIGH PROTEIN PO LIQD
1000.0000 mL | ORAL | Status: DC
  Administered 2014-12-19 – 2014-12-20 (×2): 1000 mL

## 2014-12-19 NOTE — Progress Notes (Signed)
Nutrition Follow-up   INTERVENTION:   EN: Continue to titrate Vital high Protein TF to goal rate of 69mL/hr with free water flushes of q4hours and Prostat daily. Will continue to follow.   NUTRITION DIAGNOSIS:   Inadequate oral intake related to acute illness as evidenced by NPO status, being addressed with TF   GOAL:   Patient will meet greater than or equal to 90% of their needs  MONITOR:    (Energy Intake, Pulmonary, Digestive System, Anthropometrics, Electrolyte/Renal Profile, Glucose Profile)  REASON FOR ASSESSMENT:   Consult Enteral/tube feeding initiation and management  ASSESSMENT:    Pt remains on vent via trach, awaiting LTAC referral. Per Nsg, small mucous plug suctioned last night.  Diet Order:  Diet NPO time specified    Current Nutrition: Pt tolerating Vital High Protein at 69mL/hr this am with Prostat daily and free water of q4hours.   Gastrointestinal Profile: Last BM: 12/19/2014, per RN pt had 2 BMs this am   Medications: novolog, Protonix, senokot   Electrolyte/Renal Profile and Glucose Profile:   Recent Labs Lab 12/17/14 1114 12/18/14 1002 12/18/14 1954 12/19/14 0445  NA 141 141  --  140  K 3.5 2.7* 3.3* 3.6  CL 107 106  --  106  CO2 27 28  --  30  BUN 19 18  --  19  CREATININE 1.07* 1.00  --  0.97  CALCIUM 10.7* 10.4*  --  10.7*  MG 1.2* 1.2* 1.9 1.8  PHOS 2.3* 2.7  --  2.5  GLUCOSE 92 111*  --  116*   Protein Profile:  Recent Labs Lab 12/13/14 0925  ALBUMIN 3.1*     Weight Trend since Admission: Filed Weights   12/17/14 1743 12/18/14 0540 12/19/14 0600  Weight: 224 lb 3.3 oz (101.7 kg) 221 lb 9 oz (100.5 kg) 226 lb 6.6 oz (102.7 kg)    Skin:   (stage II buttock)    BMI:  Body mass index is 35.45 kg/(m^2).  Estimated Nutritional Needs:   Kcal:  1610-9604 kcals (11-14 kcals/kg)   Protein:  122-153 g (2.0-2.5 g/kg) using IBW 61 kg  Fluid:  1525-1830 mL (25-30 ml/kg)   HIGH Care Level  Leda Quail,  RD, LDN Pager [phone]

## 2014-12-19 NOTE — Progress Notes (Signed)
eLink Physician-Brief Progress Note Patient Name: LATOSHA GAYLORD DOB: [birth year] MRN: [id number]   Date of Service  12/19/2014  HPI/Events of Note  Pt needs picc line  eICU Interventions  Iv versed given for procedure.       Intervention Category Major Interventions: Delirium, psychosis, severe agitation - evaluation and management  Shan Levans 12/19/2014, 5:59 PM

## 2014-12-19 NOTE — Progress Notes (Signed)
T. Collar wean done for 1 hour. Tolerated very well with PMV on

## 2014-12-19 NOTE — Progress Notes (Signed)
ANTIBIOTIC CONSULT NOTE - FOLLOW UP   Pharmacy Consult for Meropenm Indication: ESBL UTI/Pseudomonas PNA  Allergies  Allergen Reactions  . Codeine     Has tolerated oxycodone in the past.    Patient Measurements: Height: 5\' 7"  (170.2 cm) Weight: 226 lb 6.6 oz (102.7 kg) IBW/kg (Calculated) : 61.6   Vital Signs: Temp: 99.7 F (37.6 C) (09/10 0900) BP: 126/97 mmHg (09/10 0900) Pulse Rate: 89 (09/10 0900) Intake/Output from previous day: 09/09 0701 - 09/10 0700 In: 1340 [NG/GT:740; IV Piggyback:600] Out: 1210 [Urine:1210] Intake/Output from this shift:    Labs:  Recent Labs  12/17/14 1114 12/18/14 1002 12/19/14 0445  HGB  --   --  9.2*  CREATININE 1.07* 1.00 0.97   Estimated Creatinine Clearance: 68.4 mL/min (by C-G formula based on Cr of 0.97). No results for input(s): VANCOTROUGH, VANCOPEAK, VANCORANDOM, GENTTROUGH, GENTPEAK, GENTRANDOM, TOBRATROUGH, TOBRAPEAK, TOBRARND, AMIKACINPEAK, AMIKACINTROU, AMIKACIN in the last 72 hours.   Microbiology: Recent Results (from the past 720 hour(s))  Blood Culture (routine x 2)     Status: None   Collection Time: 12/13/14  9:25 AM  Result Value Ref Range Status   Specimen Description BLOOD LEFT ARM  Final   Special Requests BOTTLES DRAWN AEROBIC AND ANAEROBIC  Final   Culture  Setup Time   Final    GRAM POSITIVE COCCI IN CLUSTERS ANAEROBIC BOTTLE ONLY CRITICAL RESULT CALLED TO, READ BACK BY AND VERIFIED WITH: LESLIE LEWIS 12/14/2014 0630 [facility] CONFIRMED BY [facility]    Culture   Final    COAGULASE NEGATIVE STAPHYLOCOCCUS ANAEROBIC BOTTLE ONLY Results consistent with contamination.    Report Status 12/18/2014 FINAL  Final  Blood Culture (routine x 2)     Status: None   Collection Time: 12/13/14  9:35 AM  Result Value Ref Range Status   Specimen Description BLOOD RIGHT FORARM  Final   Special Requests BOTTLES DRAWN AEROBIC AND ANAEROBIC  Final   Culture NO GROWTH 5 DAYS  Final   Report Status 12/18/2014 FINAL  Final   Urine culture     Status: None   Collection Time: 12/13/14  9:35 AM  Result Value Ref Range Status   Specimen Description URINE, RANDOM  Final   Special Requests NONE  Final   Culture   Final    >=100,000 COLONIES/mL ESCHERICHIA COLI ESBL-EXTENDED SPECTRUM BETA LACTAMASE-THE ORGANISM IS RESISTANT TO PENICILLINS, CEPHALOSPORINS AND AZTREONAM ACCORDING TO CLSI M100-S15 VOL.25 N01 JAN 2005. Results Called to: Derald Macleod AT 1119 12/15/14 CTJ    Report Status 12/15/2014 FINAL  Final   Organism ID, Bacteria ESCHERICHIA COLI  Final      Susceptibility   Escherichia coli - MIC*    AMPICILLIN >=32 RESISTANT Resistant     CEFTAZIDIME 16 RESISTANT Resistant     CEFAZOLIN >=64 RESISTANT Resistant     CEFTRIAXONE 32 RESISTANT Resistant     GENTAMICIN <=1 SENSITIVE Sensitive     IMIPENEM <=0.25 SENSITIVE Sensitive     TRIMETH/SULFA >=320 RESISTANT Resistant     Extended ESBL POSITIVE Resistant     CIPROFLOXACIN Value in next row Sensitive      SENSITIVE<=0.25    NITROFURANTOIN Value in next row Sensitive      SENSITIVE<=16    PIP/TAZO Value in next row Resistant      RESISTANT>=128    ERTAPENEM Value in next row Sensitive      SENSITIVE<=0.5    * >=100,000 COLONIES/mL ESCHERICHIA COLI  MRSA PCR Screening  Status: Abnormal   Collection Time: 12/13/14 12:20 PM  Result Value Ref Range Status   MRSA by PCR POSITIVE (A) NEGATIVE Final    Comment:        The GeneXpert MRSA Assay (FDA approved for NASAL specimens only), is one component of a comprehensive MRSA colonization surveillance program. It is not intended to diagnose MRSA infection nor to guide or monitor treatment for MRSA infections. CRITICAL RESULT CALLED TO, READ BACK BY AND VERIFIED WITH: ADAM SCARBORO  ON 12/13/14 BY HP   Culture, expectorated sputum-assessment     Status: None   Collection Time: 12/15/14 10:49 AM  Result Value Ref Range Status   Specimen Description ENDOTRACHEAL  Final   Special  Requests NONE  Final   Sputum evaluation THIS SPECIMEN IS ACCEPTABLE FOR SPUTUM CULTURE  Final   Report Status 12/16/2014 FINAL  Final  Culture, respiratory (NON-Expectorated)     Status: None (Preliminary result)   Collection Time: 12/15/14 10:49 AM  Result Value Ref Range Status   Specimen Description ENDOTRACHEAL  Final   Special Requests NONE Reflexed from [id number]  Final   Gram Stain   Final    FEW WBC SEEN FEW GRAM NEGATIVE RODS FEW GRAM POSITIVE COCCI IN PAIRS FAIR SPECIMEN - 70-80% WBCS    Culture   Final    MODERATE GROWTH CITROBACTER SPECIES HEAVY GROWTH PSEUDOMONAS AERUGINOSA SUSCEPTIBILITIES TO FOLLOW FOR ORGANISM 1 MULTI-DRUG RESISTANT ORGANISM CRITICAL RESULT CALLED TO, READ BACK BY AND VERIFIED WITH: SANDRA BORBA 12/18/14 1042 JGF    Report Status PENDING  Incomplete   Organism ID, Bacteria PSEUDOMONAS AERUGINOSA  Final      Susceptibility   Pseudomonas aeruginosa - MIC*    CEFTAZIDIME >=64 RESISTANT Resistant     CIPROFLOXACIN >=4 RESISTANT Resistant     GENTAMICIN >=16 RESISTANT Resistant     IMIPENEM 1 SENSITIVE Sensitive     * HEAVY GROWTH PSEUDOMONAS AERUGINOSA    Medical History: Past Medical History  Diagnosis Date  . Tracheostomy in place   . Diabetes   . CKD (chronic kidney disease)   . OSA (obstructive sleep apnea)   . HTN (hypertension)   . MDD (major depressive disorder)     Medications:  Scheduled:  . antiseptic oral rinse  7 mL Mouth Rinse QID  . apixaban  5 mg Oral BID  . chlorhexidine gluconate  15 mL Mouth Rinse BID  . Chlorhexidine Gluconate Cloth  6 each Topical Q0600  . escitalopram  5 mg Oral Daily  . feeding supplement (PRO-STAT SUGAR FREE 64)  30 mL Per Tube Daily  . feeding supplement (VITAL HIGH PROTEIN)  1,000 mL Per Tube Q24H  . free water  100 mL Per Tube 6 times per day  . insulin aspart  0-5 Units Subcutaneous QHS  . insulin aspart  0-9 Units Subcutaneous TID WC  . ipratropium-albuterol  3 mL Nebulization Q4H  .  meropenem (MERREM) IV  1 g Intravenous 3 times per day  . mupirocin ointment  1 application Nasal BID  . pantoprazole sodium  40 mg Per Tube Daily  . QUEtiapine  50 mg Oral QHS  . senna-docusate  1 tablet Oral BID   Infusions:   PRN: acetaminophen **OR** acetaminophen, ALPRAZolam, alum & mag hydroxide-simeth, hydrALAZINE, HYDROcodone-acetaminophen, Influenza vac split quadrivalent PF, metoprolol, morphine injection, ondansetron **OR** ondansetron (ZOFRAN) IV, pneumococcal 23 valent vaccine  Assessment: 69 y/o F with acute respiratory failure on ciprofloxacin for ESBL E coli UTI. Tracheal aspirate culture growing Pseudomonas  Plan:  Will continue Merrem 1 g IV q8 hours.   Sequoia Mincey D 12/19/2014,9:57 AM

## 2014-12-19 NOTE — Progress Notes (Signed)
PHARMACY - CRITICAL CARE PROGRESS NOTE  Pharmacy Consult for Constipation Prevention/ Electrolyte management    Allergies  Allergen Reactions  . Codeine     Has tolerated oxycodone in the past.    Patient Measurements: Height:  (170.2 cm) Weight: 226 lb 6.6 oz (102.7 kg) IBW/kg (Calculated) : 61.6  Vital Signs: Temp: 99.7 F (37.6 C) (09/10 0900) BP: 126/97 mmHg (09/10 0900) Pulse Rate: 89 (09/10 0900) Intake/Output from previous day: 09/09 0701 - 09/10 0700 In: 1340 [NG/GT:740; IV Piggyback:600] Out: 1210 [Urine:1210] Intake/Output from this shift:   Vent settings for last 24 hours: Vent Mode:  [-] PSV FiO2 (%):  [30 %] 30 % Set Rate:  [15 bmp] 15 bmp Vt Set:  [500 mL] 500 mL PEEP:  [5 cmH20] 5 cmH20 Pressure Support:  [5 cmH20-8 cmH20] 8 cmH20  Labs:  Recent Labs  12/17/14 1114 12/18/14 1002 12/18/14 1954 12/19/14 0445  HGB  --   --   --  9.2*  CREATININE 1.07* 1.00  --  0.97  MG 1.2* 1.2* 1.9 1.8  PHOS 2.3* 2.7  --  2.5   Estimated Creatinine Clearance: 68.4 mL/min (by C-G formula based on Cr of 0.97).   Recent Labs  12/19/14 0009 12/19/14 0405 12/19/14 0754  GLUCAP 111* 99 121*    Microbiology: Recent Results (from the past 720 hour(s))  Blood Culture (routine x 2)     Status: None   Collection Time: 12/13/14  9:25 AM  Result Value Ref Range Status   Specimen Description BLOOD LEFT ARM  Final   Special Requests BOTTLES DRAWN AEROBIC AND ANAEROBIC  Final   Culture  Setup Time   Final    GRAM POSITIVE COCCI IN CLUSTERS ANAEROBIC BOTTLE ONLY CRITICAL RESULT CALLED TO, READ BACK BY AND VERIFIED WITH: LESLIE LEWIS 12/14/2014 0630 [facility] CONFIRMED BY [facility]    Culture   Final    COAGULASE NEGATIVE STAPHYLOCOCCUS ANAEROBIC BOTTLE ONLY Results consistent with contamination.    Report Status 12/18/2014 FINAL  Final  Blood Culture (routine x 2)     Status: None   Collection Time: 12/13/14  9:35 AM  Result Value Ref Range Status   Specimen  Description BLOOD RIGHT FORARM  Final   Special Requests BOTTLES DRAWN AEROBIC AND ANAEROBIC  Final   Culture NO GROWTH 5 DAYS  Final   Report Status 12/18/2014 FINAL  Final  Urine culture     Status: None   Collection Time: 12/13/14  9:35 AM  Result Value Ref Range Status   Specimen Description URINE, RANDOM  Final   Special Requests NONE  Final   Culture   Final    >=100,000 COLONIES/mL ESCHERICHIA COLI ESBL-EXTENDED SPECTRUM BETA LACTAMASE-THE ORGANISM IS RESISTANT TO PENICILLINS, CEPHALOSPORINS AND AZTREONAM ACCORDING TO CLSI M100-S15 VOL.25 N01 JAN 2005. Results Called to: Derald Macleod AT 1119 12/15/14 CTJ    Report Status 12/15/2014 FINAL  Final   Organism ID, Bacteria ESCHERICHIA COLI  Final      Susceptibility   Escherichia coli - MIC*    AMPICILLIN >=32 RESISTANT Resistant     CEFTAZIDIME 16 RESISTANT Resistant     CEFAZOLIN >=64 RESISTANT Resistant     CEFTRIAXONE 32 RESISTANT Resistant     GENTAMICIN <=1 SENSITIVE Sensitive     IMIPENEM <=0.25 SENSITIVE Sensitive     TRIMETH/SULFA >=320 RESISTANT Resistant     Extended ESBL POSITIVE Resistant     CIPROFLOXACIN Value in next row Sensitive  SENSITIVE<=0.25    NITROFURANTOIN Value in next row Sensitive      SENSITIVE<=16    PIP/TAZO Value in next row Resistant      RESISTANT>=128    ERTAPENEM Value in next row Sensitive      SENSITIVE<=0.5    * >=100,000 COLONIES/mL ESCHERICHIA COLI  MRSA PCR Screening     Status: Abnormal   Collection Time: 12/13/14 12:20 PM  Result Value Ref Range Status   MRSA by PCR POSITIVE (A) NEGATIVE Final    Comment:        The GeneXpert MRSA Assay (FDA approved for NASAL specimens only), is one component of a comprehensive MRSA colonization surveillance program. It is not intended to diagnose MRSA infection nor to guide or monitor treatment for MRSA infections. CRITICAL RESULT CALLED TO, READ BACK BY AND VERIFIED WITH: ADAM SCARBORO @1338  ON 12/13/14 BY HP    Culture, expectorated sputum-assessment     Status: None   Collection Time: 12/15/14 10:49 AM  Result Value Ref Range Status   Specimen Description ENDOTRACHEAL  Final   Special Requests NONE  Final   Sputum evaluation THIS SPECIMEN IS ACCEPTABLE FOR SPUTUM CULTURE  Final   Report Status 12/16/2014 FINAL  Final  Culture, respiratory (NON-Expectorated)     Status: None (Preliminary result)   Collection Time: 12/15/14 10:49 AM  Result Value Ref Range Status   Specimen Description ENDOTRACHEAL  Final   Special Requests NONE Reflexed from [id number]  Final   Gram Stain   Final    FEW WBC SEEN FEW GRAM NEGATIVE RODS FEW GRAM POSITIVE COCCI IN PAIRS FAIR SPECIMEN - 70-80% WBCS    Culture   Final    MODERATE GROWTH CITROBACTER SPECIES HEAVY GROWTH PSEUDOMONAS AERUGINOSA SUSCEPTIBILITIES TO FOLLOW FOR ORGANISM 1 MULTI-DRUG RESISTANT ORGANISM CRITICAL RESULT CALLED TO, READ BACK BY AND VERIFIED WITH: SANDRA BORBA 12/18/14 1042 JGF    Report Status PENDING  Incomplete   Organism ID, Bacteria PSEUDOMONAS AERUGINOSA  Final      Susceptibility   Pseudomonas aeruginosa - MIC*    CEFTAZIDIME >=64 RESISTANT Resistant     CIPROFLOXACIN >=4 RESISTANT Resistant     GENTAMICIN >=16 RESISTANT Resistant     IMIPENEM 1 SENSITIVE Sensitive     * HEAVY GROWTH PSEUDOMONAS AERUGINOSA    Medications:  Scheduled:  . antiseptic oral rinse  7 mL Mouth Rinse QID  . apixaban  5 mg Oral BID  . chlorhexidine gluconate  15 mL Mouth Rinse BID  . Chlorhexidine Gluconate Cloth  6 each Topical Q0600  . escitalopram  5 mg Oral Daily  . feeding supplement (PRO-STAT SUGAR FREE 64)  30 mL Per Tube Daily  . feeding supplement (VITAL HIGH PROTEIN)  1,000 mL Per Tube Q24H  . free water  100 mL Per Tube 6 times per day  . insulin aspart  0-5 Units Subcutaneous QHS  . insulin aspart  0-9 Units Subcutaneous TID WC  . ipratropium-albuterol  3 mL Nebulization Q4H  . meropenem (MERREM) IV  1 g Intravenous 3 times per day   . mupirocin ointment  1 application Nasal BID  . pantoprazole sodium  40 mg Per Tube Daily  . QUEtiapine  50 mg Oral QHS  . senna-docusate  1 tablet Oral BID   Infusions:    PRN: acetaminophen **OR** acetaminophen, ALPRAZolam, alum & mag hydroxide-simeth, hydrALAZINE, HYDROcodone-acetaminophen, Influenza vac split quadrivalent PF, metoprolol, morphine injection, ondansetron **OR** ondansetron (ZOFRAN) IV, pneumococcal 23 valent vaccine  Assessment: 69 y/o F  with acute respiratory failure, sepsis, ESBL UTI.   Electrolytes are within normal limits.   Plan:  Constipation: Will continue senna/docusate 1 tablet bid. Will assess BM in am. Electrolytes: Will order electrolyte panel with am labs.   Demetrius Charity, PharmD

## 2014-12-19 NOTE — Progress Notes (Signed)
[facility] - [place] at [facility]   PATIENT NAME: Rachel Brady    MR#:  [id number]  DATE OF BIRTH:  1946-01-25  SUBJECTIVE:  CHIEF COMPLAINT:   Chief Complaint  Patient presents with  . Respiratory Arrest  Patient is lethargic but arousable , on PS today   REVIEW OF SYSTEMS:  Review of Systems  Unable to perform ROS: critical illness   DRUG ALLERGIES:   Allergies  Allergen Reactions  . Codeine     Has tolerated oxycodone in the past.   VITALS:  Blood pressure 136/79, pulse 98, temperature 99.5 F (37.5 C), temperature source Core (Comment), resp. rate 26, height 5\' 7"  (1.702 m), weight 102.7 kg (226 lb 6.6 oz), SpO2 99 %. PHYSICAL EXAMINATION:  Physical Exam  Constitutional: She is oriented to person, place, and time and well-developed, well-nourished, and in no distress.  HENT:  Head: Normocephalic and atraumatic.  Trach in place  Eyes: Conjunctivae and EOM are normal. Pupils are equal, round, and reactive to light.  Neck: Normal range of motion. Neck supple. No tracheal deviation present. No thyromegaly present.  Cardiovascular: Normal rate, regular rhythm and normal heart sounds.   Pulmonary/Chest: Effort normal and breath sounds normal. No respiratory distress. She has no wheezes. She exhibits no tenderness.  Abdominal: Soft. Bowel sounds are normal. She exhibits no distension. There is no tenderness.  Musculoskeletal: Normal range of motion.  Neurological: She is alert and oriented to person, place, and time. No cranial nerve deficit.  Skin: Skin is warm and dry. No rash noted.  Psychiatric: Mood and affect normal.   LABORATORY PANEL:   CBC  Recent Labs Lab 12/15/14 1048 12/19/14 0445  WBC 6.4  --   HGB 10.2* 9.2*  HCT 33.0*  --   PLT 127*  --    ------------------------------------------------------------------------------------------------------------------ Chemistries   Recent Labs Lab 12/13/14 0925  12/19/14 0445   NA 141  < > 140  K 4.9  < > 3.6  CL 102  < > 106  CO2 32  < > 30  GLUCOSE 180*  < > 116*  BUN 34*  < > 19  CREATININE 1.26*  < > 0.97  CALCIUM 11.9*  < > 10.7*  MG  --   < > 1.8  AST 32  --   --   ALT 87*  --   --   ALKPHOS 128*  --   --   BILITOT 0.5  --   --   < > = values in this interval not displayed. RADIOLOGY:  No results found. ASSESSMENT AND PLAN:  This is a 69 year old female with a history of tracheostomy, atrial fibrillation, diabetes who presents with sepsis.  1. Sepsis with hypotension: secondary to urinary tract infection.  Patient's blood pressure is better  Appreciate ID input. Abx changed to Meropenem Continue meropenem for a total of 14 days. Needs PICC line  2. ESBL E.coli Urinary tract infection: changed to meropenem  Appreciate infectious disease input.    3. Healthcare acquired pneumonia:  Continue meropenem as trach suction sputum growing Pseudomonas & Citrobacter  4. Acute respiratory failure: on TCT for 30 minutes today, wean fio2 as tolerated Appreciate critical care management recommendations  5. Atrial fibrillation: Her heart rate is well controlled at this time  6. Diabetes: on tube feeds vital  and tolerating, sliding scale insulin for now.  7. Depression/anxiety: If able to give these medications via NG tube we will continue these.  8. Fungal skin  infection: continue nystatin.   All the records are reviewed and case discussed with Care Management/Social Worker. Family members are unavailable  CODE STATUS: Full code  TOTAL TIME (CRITICAL CARE) TAKING CARE OF THIS PATIENT: 35 minutes.   More than 50% of the time was spent in counseling/coordination of care: YES  Did peer-peer Review with [employer] but they declined - they recommend appeal if CM/SW would like.  Ramonita Lab M.D on 12/19/2014 at 1:18 PM  Between 7am to 6pm - Pager - [phone]  After 6pm go to [email or web address] - password [email or web address] [facility]  [place] [facility]  Office   [phone]  CC: Primary care physician; Dorothey Baseman, MD

## 2014-12-19 NOTE — Progress Notes (Addendum)
Pharmacy Consult for Electrolyte Management   Allergies  Allergen Reactions  . Codeine     Has tolerated oxycodone in the past.    Patient Measurements: Height:  (170.2 cm) Weight: 226 lb 6.6 oz (102.7 kg) IBW/kg (Calculated) : 61.6  Vital Signs: Temp: 99.5 F (37.5 C) (09/10 0600) BP: 130/61 mmHg (09/10 0600) Pulse Rate: 90 (09/10 0600) Intake/Output from previous day: 09/09 0701 - 09/10 0700 In: 1340 [NG/GT:740; IV Piggyback:600] Out: 1210 [Urine:1210] Intake/Output from this shift: Total I/O In: 1340 [NG/GT:740; IV Piggyback:600] Out: 810 [Urine:810]  Labs:  Recent Labs  12/19/14 0445  HGB 9.2*     Recent Labs  12/17/14 1114 12/18/14 1002 12/18/14 1954 12/19/14 0445  NA 141 141  --  140  K 3.5 2.7* 3.3* 3.6  CL 107 106  --  106  CO2 27 28  --  30  GLUCOSE 92 111*  --  116*  BUN 19 18  --  19  CREATININE 1.07* 1.00  --  0.97  CALCIUM 10.7* 10.4*  --  10.7*  MG 1.2* 1.2* 1.9 1.8  PHOS 2.3* 2.7  --  2.5   Estimated Creatinine Clearance: 68.4 mL/min (by C-G formula based on Cr of 0.97).    Recent Labs  12/18/14 1947 12/19/14 0009 12/19/14 0405  GLUCAP 112* 111* 99    Medical History: Past Medical History  Diagnosis Date  . Tracheostomy in place   . Diabetes   . CKD (chronic kidney disease)   . OSA (obstructive sleep apnea)   . HTN (hypertension)   . MDD (major depressive disorder)     Medications:  Scheduled:  . antiseptic oral rinse  7 mL Mouth Rinse QID  . apixaban  5 mg Oral BID  . chlorhexidine gluconate  15 mL Mouth Rinse BID  . Chlorhexidine Gluconate Cloth  6 each Topical Q0600  . escitalopram  5 mg Oral Daily  . feeding supplement (PRO-STAT SUGAR FREE 64)  30 mL Per Tube Daily  . feeding supplement (VITAL HIGH PROTEIN)  1,000 mL Per Tube Q24H  . free water  100 mL Per Tube 6 times per day  . insulin aspart  0-5 Units Subcutaneous QHS  . insulin aspart  0-9 Units Subcutaneous TID WC  . ipratropium-albuterol  3 mL  Nebulization Q4H  . meropenem (MERREM) IV  1 g Intravenous 3 times per day  . mupirocin ointment  1 application Nasal BID  . pantoprazole sodium  40 mg Per Tube Daily  . QUEtiapine  50 mg Oral QHS  . senna-docusate  1 tablet Oral BID   Infusions:   PRN: acetaminophen **OR** acetaminophen, ALPRAZolam, alum & mag hydroxide-simeth, hydrALAZINE, HYDROcodone-acetaminophen, Influenza vac split quadrivalent PF, metoprolol, morphine injection, ondansetron **OR** ondansetron (ZOFRAN) IV, pneumococcal 23 valent vaccine   Assessment: Pharmacy consulted to manage electrolytes in this 69 y/o F with acute respiratory failure and sepsis.   Potassium and magnesium levels are low  Plan:  Will order KCL 10 meq x 6 and magnesium sulfate 4 g iv once. Will recheck potassium and magnesium at 2000 and replace again if necessary. BMET, mag, and phos ordered with AM labs.   9/9:  K @ 20:00 = 3.3 Will order KCl 40 mEq IV X 1 and recheck K on 9/10 with AM labs.   9/10 AM labs WNL except calcium a bit elevated. No new electrolyte orders entered. Labs in AM.   Rachel Brady 12/19/2014,6:49 AM

## 2014-12-19 NOTE — Consult Note (Signed)
[facility] Pulmonary Medicine Consultation      Date: 12/19/2014,   MRN# [id number] Rachel Brady [birth year] Code Status:     Code Status Orders        Start     Ordered   12/13/14 1231  Full code   Continuous     12/13/14 1230     Hosp day:@LENGTHOFSTAYDAYS @ Referring MD: @ATDPROV @     PCP:      AdmissionWeight: 211 lb (95.709 kg)                 CurrentWeight: 226 lb 6.6 oz (102.7 kg)      CHIEF COMPLAINT:  SOb and confusion  SOb  SUBJECTIVE   Patient mild somnolent this AM but easily awaken, will wean PS today and attempt TCT for today  wean fio2 as tolerated, Xanax as needed, s/p dubhoff tube for TFs Awaiting LTACH referral Sputum cultures pending   PAST MEDICAL HISTORY   Past Medical History  Diagnosis Date  . Tracheostomy in place   . Diabetes   . CKD (chronic kidney disease)   . OSA (obstructive sleep apnea)   . HTN (hypertension)   . MDD (major depressive disorder)      SURGICAL HISTORY   History reviewed. No pertinent past surgical history.   FAMILY HISTORY   History reviewed. No pertinent family history.   SOCIAL HISTORY   Social History  Substance Use Topics  . Smoking status: Never Smoker   . Smokeless tobacco: None  . Alcohol Use: None     MEDICATIONS    Home Medication:  No current outpatient prescriptions on file.  Current Medication:  Current facility-administered medications:  .  acetaminophen (TYLENOL) tablet 650 mg, 650 mg, Oral, Q6H PRN **OR** acetaminophen (TYLENOL) suppository 650 mg, 650 mg, Rectal, Q6H PRN, Adrian Saran, MD .  ALPRAZolam Prudy Feeler) tablet 0.25 mg, 0.25 mg, Oral, Q8H PRN, Erin Fulling, MD, 0.25 mg at 12/18/14 2030 .  alum & mag hydroxide-simeth (MAALOX/MYLANTA) 200-200-20 MG/5ML suspension 30 mL, 30 mL, Oral, Q6H PRN, Adrian Saran, MD .  antiseptic oral rinse solution (CORINZ), 7 mL, Mouth Rinse, QID, Sital Mody, MD, 7 mL at 12/19/14 0400 .  apixaban (ELIQUIS) tablet 5 mg, 5 mg, Oral,  BID, Adrian Saran, MD, 5 mg at 12/18/14 2144 .  chlorhexidine gluconate (PERIDEX) 0.12 % solution 15 mL, 15 mL, Mouth Rinse, BID, Sital Mody, MD, 15 mL at 12/19/14 0819 .  Chlorhexidine Gluconate Cloth 2 % PADS 6 each, 6 each, Topical, Q0600, Delfino Lovett, MD, 6 each at 12/18/14 0537 .  escitalopram (LEXAPRO) tablet 5 mg, 5 mg, Oral, Daily, Adrian Saran, MD, 5 mg at 12/18/14 1040 .  feeding supplement (PRO-STAT SUGAR FREE 64) liquid 30 mL, 30 mL, Per Tube, Daily, Erin Fulling, MD, 30 mL at 12/18/14 1041 .  feeding supplement (VITAL HIGH PROTEIN) liquid 1,000 mL, 1,000 mL, Per Tube, Q24H, Erin Fulling, MD, 1,000 mL at 12/18/14 1345 .  free water 100 mL, 100 mL, Per Tube, 6 times per day, Erin Fulling, MD, 100 mL at 12/19/14 0800 .  hydrALAZINE (APRESOLINE) injection 20 mg, 20 mg, Intravenous, Q4H PRN, Erin Fulling, MD .  HYDROcodone-acetaminophen (NORCO/VICODIN) 5-325 MG per tablet 1-2 tablet, 1-2 tablet, Oral, Q4H PRN, Adrian Saran, MD .  Influenza vac split quadrivalent PF (FLUARIX) injection 0.5 mL, 0.5 mL, Intramuscular, Prior to discharge, Delfino Lovett, MD .  insulin aspart (novoLOG) injection 0-5 Units, 0-5 Units, Subcutaneous, QHS, Adrian Saran, MD, 0 Units at 12/13/14  2149 .  insulin aspart (novoLOG) injection 0-9 Units, 0-9 Units, Subcutaneous, TID WC, Adrian Saran, MD, 1 Units at 12/19/14 0814 .  ipratropium-albuterol (DUONEB) 0.5-2.5 (3) MG/3ML nebulizer solution 3 mL, 3 mL, Nebulization, Q4H, Erin Fulling, MD, 3 mL at 12/19/14 0746 .  meropenem (MERREM) 1 g in sodium chloride 0.9 % 100 mL IVPB, 1 g, Intravenous, 3 times per day, Erin Fulling, MD, 1 g at 12/19/14 0611 .  metoprolol (LOPRESSOR) injection 5 mg, 5 mg, Intravenous, Q3H PRN, Stephanie Acre, MD, 5 mg at 12/15/14 1703 .  morphine 2 MG/ML injection 2 mg, 2 mg, Intravenous, Q1H PRN, Erin Fulling, MD, 2 mg at 12/16/14 0544 .  mupirocin ointment (BACTROBAN) 2 % 1 application, 1 application, Nasal, BID, Delfino Lovett, MD, 1 application at 12/18/14 2155 .   ondansetron (ZOFRAN) tablet 4 mg, 4 mg, Oral, Q6H PRN **OR** ondansetron (ZOFRAN) injection 4 mg, 4 mg, Intravenous, Q6H PRN, Adrian Saran, MD .  pantoprazole sodium (PROTONIX) 40 mg/20 mL oral suspension 40 mg, 40 mg, Per Tube, Daily, Delfino Lovett, MD, 40 mg at 12/18/14 1849 .  pneumococcal 23 valent vaccine (PNU-IMMUNE) injection 0.5 mL, 0.5 mL, Intramuscular, Prior to discharge, Delfino Lovett, MD .  QUEtiapine (SEROQUEL) tablet 50 mg, 50 mg, Oral, QHS, Adrian Saran, MD, 50 mg at 12/18/14 2201 .  senna-docusate (Senokot-S) tablet 1 tablet, 1 tablet, Oral, BID, Delfino Lovett, MD, 1 tablet at 12/18/14 1229    ALLERGIES   Codeine     REVIEW OF SYSTEMS   Review of Systems  Constitutional: Positive for malaise/fatigue.  Eyes: Negative.   Respiratory: Negative for shortness of breath.   Cardiovascular: Negative.   Gastrointestinal: Negative.   Musculoskeletal: Negative.   Neurological: Negative for headaches.  Psychiatric/Behavioral: The patient is nervous/anxious.   All other systems reviewed and are negative.    VS: BP 126/97 mmHg  Pulse 89  Temp(Src) 99.7 F (37.6 C) (Core (Comment))  Resp 21  Ht 5\' 7"  (1.702 m)  Wt 226 lb 6.6 oz (102.7 kg)  BMI 35.45 kg/m2  SpO2 96%     PHYSICAL EXAM  Physical Exam  Constitutional: No distress.  S/p trach  HENT:  Head: Normocephalic and atraumatic.  Mouth/Throat: No oropharyngeal exudate.  Eyes: EOM are normal. Pupils are equal, round, and reactive to light. No scleral icterus.  Neck: Normal range of motion. Neck supple.  Cardiovascular: Normal rate, regular rhythm and normal heart sounds.   No murmur heard. Pulmonary/Chest: No stridor. No respiratory distress. She has no wheezes. She has rales.  Abdominal: Soft. Bowel sounds are normal. She exhibits no distension. There is no tenderness. There is no rebound.  Musculoskeletal: Normal range of motion. She exhibits no edema.  Neurological: She displays normal reflexes. Coordination normal.    Lethargy and confusion  Skin: Skin is warm. She is not diaphoretic.  Psychiatric: She has a normal mood and affect.        LABS    Recent Labs     12/17/14  1114  12/18/14  1002  12/19/14  0445  HGB   --    --   9.2*  BUN  19  18  19   CREATININE  1.07*  1.00  0.97  GLUCOSE  92  111*  116*  CALCIUM  10.7*  10.4*  10.7*  ,    No results for input(s): PH in the last 72 hours.  Invalid input(s): PCO2, PO2, BASEEXCESS, BASEDEFICITE, TFT    CULTURE RESULTS   Recent Results (  from the past 240 hour(s))  Blood Culture (routine x 2)     Status: None   Collection Time: 12/13/14  9:25 AM  Result Value Ref Range Status   Specimen Description BLOOD LEFT ARM  Final   Special Requests BOTTLES DRAWN AEROBIC AND ANAEROBIC  Final   Culture  Setup Time   Final    GRAM POSITIVE COCCI IN CLUSTERS ANAEROBIC BOTTLE ONLY CRITICAL RESULT CALLED TO, READ BACK BY AND VERIFIED WITH: LESLIE LEWIS 12/14/2014 0630 [facility] CONFIRMED BY [facility]    Culture   Final    COAGULASE NEGATIVE STAPHYLOCOCCUS ANAEROBIC BOTTLE ONLY Results consistent with contamination.    Report Status 12/18/2014 FINAL  Final  Blood Culture (routine x 2)     Status: None   Collection Time: 12/13/14  9:35 AM  Result Value Ref Range Status   Specimen Description BLOOD RIGHT FORARM  Final   Special Requests BOTTLES DRAWN AEROBIC AND ANAEROBIC  Final   Culture NO GROWTH 5 DAYS  Final   Report Status 12/18/2014 FINAL  Final  Urine culture     Status: None   Collection Time: 12/13/14  9:35 AM  Result Value Ref Range Status   Specimen Description URINE, RANDOM  Final   Special Requests NONE  Final   Culture   Final    >=100,000 COLONIES/mL ESCHERICHIA COLI ESBL-EXTENDED SPECTRUM BETA LACTAMASE-THE ORGANISM IS RESISTANT TO PENICILLINS, CEPHALOSPORINS AND AZTREONAM ACCORDING TO CLSI M100-S15 VOL.25 N01 JAN 2005. Results Called to: Derald Macleod AT 1119 12/15/14 CTJ    Report Status 12/15/2014 FINAL  Final    Organism ID, Bacteria ESCHERICHIA COLI  Final      Susceptibility   Escherichia coli - MIC*    AMPICILLIN >=32 RESISTANT Resistant     CEFTAZIDIME 16 RESISTANT Resistant     CEFAZOLIN >=64 RESISTANT Resistant     CEFTRIAXONE 32 RESISTANT Resistant     GENTAMICIN <=1 SENSITIVE Sensitive     IMIPENEM <=0.25 SENSITIVE Sensitive     TRIMETH/SULFA >=320 RESISTANT Resistant     Extended ESBL POSITIVE Resistant     CIPROFLOXACIN Value in next row Sensitive      SENSITIVE<=0.25    NITROFURANTOIN Value in next row Sensitive      SENSITIVE<=16    PIP/TAZO Value in next row Resistant      RESISTANT>=128    ERTAPENEM Value in next row Sensitive      SENSITIVE<=0.5    * >=100,000 COLONIES/mL ESCHERICHIA COLI  MRSA PCR Screening     Status: Abnormal   Collection Time: 12/13/14 12:20 PM  Result Value Ref Range Status   MRSA by PCR POSITIVE (A) NEGATIVE Final    Comment:        The GeneXpert MRSA Assay (FDA approved for NASAL specimens only), is one component of a comprehensive MRSA colonization surveillance program. It is not intended to diagnose MRSA infection nor to guide or monitor treatment for MRSA infections. CRITICAL RESULT CALLED TO, READ BACK BY AND VERIFIED WITH: ADAM SCARBORO @1338  ON 12/13/14 BY HP   Culture, expectorated sputum-assessment     Status: None   Collection Time: 12/15/14 10:49 AM  Result Value Ref Range Status   Specimen Description ENDOTRACHEAL  Final   Special Requests NONE  Final   Sputum evaluation THIS SPECIMEN IS ACCEPTABLE FOR SPUTUM CULTURE  Final   Report Status 12/16/2014 FINAL  Final  Culture, respiratory (NON-Expectorated)     Status: None (Preliminary result)   Collection Time: 12/15/14 10:49  AM  Result Value Ref Range Status   Specimen Description ENDOTRACHEAL  Final   Special Requests NONE Reflexed from [id number]  Final   Gram Stain   Final    FEW WBC SEEN FEW GRAM NEGATIVE RODS FEW GRAM POSITIVE COCCI IN PAIRS FAIR SPECIMEN - 70-80% WBCS      Culture   Final    MODERATE GROWTH CITROBACTER SPECIES HEAVY GROWTH PSEUDOMONAS AERUGINOSA SUSCEPTIBILITIES TO FOLLOW FOR ORGANISM 1 MULTI-DRUG RESISTANT ORGANISM CRITICAL RESULT CALLED TO, READ BACK BY AND VERIFIED WITH: [facility] BORBA 12/18/14 1042 JGF    Report Status PENDING  Incomplete   Organism ID, Bacteria PSEUDOMONAS AERUGINOSA  Final      Susceptibility   Pseudomonas aeruginosa - MIC*    CEFTAZIDIME >=64 RESISTANT Resistant     CIPROFLOXACIN >=4 RESISTANT Resistant     GENTAMICIN >=16 RESISTANT Resistant     IMIPENEM 1 SENSITIVE Sensitive     * HEAVY GROWTH PSEUDOMONAS AERUGINOSA          IMAGING    Dg Chest 1 View  12/13/2014   CLINICAL DATA:  Respiratory arrest twice this morning.  EXAM: CHEST  1 VIEW  COMPARISON:  08/02/2014.  FINDINGS: Stable enlarged cardiac silhouette and post CABG changes. Tracheostomy tube in satisfactory position. Dense airspace opacity in the lower half of the left lung. Patchy airspace opacity in the right mid and lower lung zones. Bilateral pleural effusions. Stable prosthetic heart valve and thoracic spine fixation hardware. Thoracic spine degenerative changes.  IMPRESSION: 1. Dense left basilar atelectasis or pneumonia. 2. Right mid and lower lung zone pneumonia or alveolar edema. 3. Cardiomegaly and bilateral pleural effusions.   Electronically Signed   By: Beckie Salts M.D.   On: 12/13/2014 10:17   Ct Head Wo Contrast  12/13/2014   CLINICAL DATA:  Patient presents to the ED via EMS from [facility]. Patient stopped breathing twice with EMS but after they have approx. 5 breaths with bag mask ventilation patient's spontaneous breathing returned. This occurred twice enroute to hospital. On arrival patient was hypotensive and responding to pain only. Patient breathing on her own at this time. Eval AMS  EXAM: CT HEAD WITHOUT CONTRAST  TECHNIQUE: Contiguous axial images were obtained from the base of the skull through the vertex without intravenous  contrast.  COMPARISON:  04/03/2011  FINDINGS: There is moderate central and cortical atrophy. Periventricular white matter changes are consistent with small vessel disease. There is no intra or extra-axial fluid collection or mass lesion. The basilar cisterns and ventricles have a normal appearance. There is no CT evidence for acute infarction or hemorrhage. Bone windows show no acute findings. There is atherosclerotic calcification of the internal carotid arteries.  IMPRESSION: 1. Atrophy and small vessel disease. 2.  No evidence for acute intracranial abnormality.   Electronically Signed   By: Norva Pavlov M.D.   On: 12/13/2014 10:56   Dg Chest Port 1 View  12/15/2014   CLINICAL DATA:  Respiratory distress  EXAM: PORTABLE CHEST - 1 VIEW  COMPARISON:  12/14/2014  FINDINGS: There is a tracheostomy tube in satisfactory position. There is bilateral interstitial and alveolar airspace opacities. There is a trace right pleural effusion. There is a moderate left pleural effusion with associated airspace disease likely reflecting atelectasis. There is prominence of the central pulmonary vasculature. There is no pneumothorax. There is stable cardiomegaly. There is evidence of prior CABG. There is no acute osseous abnormality.  IMPRESSION: 1. Overall findings most consistent with CHF. 2. Moderate  left pleural effusion with associated airspace disease likely reflecting atelectasis.   Electronically Signed   By: Elige Ko   On: 12/15/2014 09:20   Portable Chest 1 View  12/14/2014   CLINICAL DATA:  69 year old female with respiratory failure, sepsis. Initial encounter.  EXAM: PORTABLE CHEST - 1 VIEW  COMPARISON:  12/13/2014 and earlier.  FINDINGS: Portable AP semi upright view at 0555 hours. Stable tracheostomy tube. Stable severe cardiomegaly. Other mediastinal contours are stable, suggestion of some central pulmonary artery enlargement. Mildly decreased pulmonary vascularity. No pneumothorax. Continued dense  retrocardiac opacity. Less veiling opacity at the right lung base. Chronic bilateral lateral rib fractures.  IMPRESSION: 1. Severe cardiomegaly with dense lower lobe collapse or consolidation greater on the left. 2. Mildly decreased pulmonary vascularity and veiling opacity at the right lung base suggesting some improvement in pulmonary edema and right pleural effusion.   Electronically Signed   By: Odessa Fleming M.D.   On: 12/14/2014 07:57   Dg Abd Portable 1v  12/17/2014   CLINICAL DATA:  NG tube placement.  EXAM: PORTABLE ABDOMEN - 1 VIEW  COMPARISON:  None.  FINDINGS: Feeding tube tip is in the fundus of the stomach. Chronic pleural effusions and basilar atelectasis. Moderate air in the nondistended colon.  IMPRESSION: Feeding tube tip is in the fundus of the stomach.   Electronically Signed   By: Francene Boyers M.D.   On: 12/17/2014 14:55   MAJOR EVENTS/TEST RESULTS: Admitted 9/4 for resp failure and sepsis 9/5 placed back on vent likely mucus plugs   INDWELLING DEVICES::  MICRO DATA: MRSA PCR >>+MRSA Urine >> E. Coli(ESBL) Blood>> Resp >>moderate citrobacter and heavy pseudomonas>>  ANTIMICROBIALS:  Zosyn 9/4>>9/6 Vancomycin 9/4>>9/6 cipro 9/6>>9/9 Meropenem 9/9>>     ASSESSMENT/PLAN    69 yo white female with acute resp failure from acute encephalopathy from acute UTI and GPC bacteremia with sepsis, now with resp failure placed back on vent likley due to mucus plugs/pneumonia-sputum culture pending  1.Respiratory Failure -continue Full MV support as needed-will wean PS mode and wean to TCT today for as tolerated, if pass TCT will double each day moving foward -continue Bronchodilator Therapy  2. ?Tracheitis - resp cx with citrobacter and Pseudomonas, cont with meropenem  3.UTI sepsis ESBL -continue abx    I have personally obtained a history, examined the patient, evaluated laboratory and independently reviewed imaging results, formulated the assessment and  plan and placed orders.  The Patient requires high complexity decision making for assessment and support, frequent evaluation and titration of therapies, application of advanced monitoring technologies and extensive interpretation of multiple databases. Time spent with patient 45 minutes. Recommend LTACH referral    Stephanie Acre, MD [facility] Pager [phone] (please enter 7-digits) On Call Pager - [phone] (please enter 7-digits)

## 2014-12-19 NOTE — Clinical Social Work Note (Signed)
CSW informed by RN CM yesterday that patient was denied LTAC level of care. Patient continues on vent at this time. Plan is for patient to return to [facility] at discharge when able. York Spaniel MSW,LCSW [phone]

## 2014-12-19 NOTE — Progress Notes (Signed)
   12/19/14 1310  Clinical Encounter Type  Visited With Patient  Visit Type Initial  Spiritual Encounters  Spiritual Needs Emotional;Prayer  Stress Factors  Patient Stress Factors Exhausted;Health changes  Met w/patient to provide emotional & spiritual care.  Chap. Amauri Medellin G. [place]

## 2014-12-20 ENCOUNTER — Inpatient Hospital Stay: Payer: Medicare Other

## 2014-12-20 LAB — BASIC METABOLIC PANEL
Anion gap: 5 (ref 5–15)
BUN: 20 mg/dL (ref 6–20)
CALCIUM: 10.5 mg/dL — AB (ref 8.9–10.3)
CO2: 28 mmol/L (ref 22–32)
CREATININE: 0.78 mg/dL (ref 0.44–1.00)
Chloride: 106 mmol/L (ref 101–111)
GFR calc Af Amer: >60 mL/min (ref >60)
GLUCOSE: 105 mg/dL — AB (ref 65–99)
POTASSIUM: 3.7 mmol/L (ref 3.5–5.1)
SODIUM: 139 mmol/L (ref 135–145)

## 2014-12-20 LAB — CBC
HCT: 29.2 % — ABNORMAL LOW (ref 35.0–47.0)
Hemoglobin: 9.1 g/dL — ABNORMAL LOW (ref 12.0–16.0)
MCH: 26.7 pg (ref 26.0–34.0)
MCHC: 31.2 g/dL — AB (ref 32.0–36.0)
MCV: 85.6 fL (ref 80.0–100.0)
PLATELETS: 97 10*3/uL — AB (ref 150–440)
RBC: 3.41 MIL/uL — AB (ref 3.80–5.20)
RDW: 20.9 % — ABNORMAL HIGH (ref 11.5–14.5)
WBC: 5.2 10*3/uL (ref 3.6–11.0)

## 2014-12-20 LAB — GLUCOSE, CAPILLARY
Glucose-Capillary: 103 mg/dL — ABNORMAL HIGH (ref 65–99)
Glucose-Capillary: 112 mg/dL — ABNORMAL HIGH (ref 65–99)
Glucose-Capillary: 133 mg/dL — ABNORMAL HIGH (ref 65–99)
Glucose-Capillary: 96 mg/dL (ref 65–99)

## 2014-12-20 LAB — PHOSPHORUS: Phosphorus: 2.4 mg/dL — ABNORMAL LOW (ref 2.5–4.6)

## 2014-12-20 LAB — MAGNESIUM: MAGNESIUM: 1.7 mg/dL (ref 1.7–2.4)

## 2014-12-20 MED ORDER — POTASSIUM & SODIUM PHOSPHATES 280-160-250 MG PO PACK
1.0000 | PACK | Freq: Three times a day (TID) | ORAL | Status: AC
  Administered 2014-12-20 (×2): 1
  Filled 2014-12-20 (×2): qty 1

## 2014-12-20 NOTE — Evaluation (Signed)
Physical Therapy Evaluation Patient Details Name: Rachel Brady MRN: [id number] DOB: 01/09/1946 Today's Date: 12/20/2014   History of Present Illness  Pt here with respiratory failure.  Now alternating between vent and trach valve  Clinical Impression  Pt able to communicate well with speaking valve, is eager to work with PT.  She is limited with some weakness, but generally does better than expected.  Deferred mobility/walking secondary to BM during bed exercises but with a walker she likely could try a few steps next PT session.  Her o2 remains in the mid/high 90s t/o the session and she does not have any reported pain.  Pt is normally very independent and has a step-son and neighbors who could help her at home, but she is likely too weak to do that at this time (has 4 steps with rails, no AD at home).  Pt does well and shows good effort with LE bed exercises.     Follow Up Recommendations SNF    Equipment Recommendations  Rolling walker with 5" wheels    Recommendations for Other Services       Precautions / Restrictions Precautions Precautions: Fall Restrictions Weight Bearing Restrictions: No      Mobility  Bed Mobility               General bed mobility comments: deferred bed mobility as pt had a BM during exercises and needed a clean up  Transfers                    Ambulation/Gait                Stairs            Wheelchair Mobility    Modified Rankin (Stroke Patients Only)       Balance                                             Pertinent Vitals/Pain Pain Assessment: No/denies pain    Home Living Family/patient expects to be discharged to:: Skilled nursing facility                      Prior Function Level of Independence: Independent         Comments: Pt did not need an AD, was driving and able to do all errands, ADLs, etc     Hand Dominance        Extremity/Trunk Assessment   Upper Extremity Assessment: Generalized weakness (unable to raise either UE > 90 degrees)           Lower Extremity Assessment: Generalized weakness (pt with grossly 4- to 4/5 strength t/o LEs)         Communication   Communication: No difficulties;Passy-Muir valve  Cognition Arousal/Alertness: Awake/alert Behavior During Therapy: WFL for tasks assessed/performed Overall Cognitive Status: Within Functional Limits for tasks assessed                      General Comments      Exercises General Exercises - Lower Extremity Ankle Circles/Pumps: Strengthening;10 reps;Both Quad Sets: AROM;5 reps;Both Heel Slides: 5 reps;AROM;Both Hip ABduction/ADduction: 10 reps;Strengthening;AROM;Both      Assessment/Plan    PT Assessment Patient needs continued PT services  PT Diagnosis Difficulty walking;Generalized weakness   PT Problem List Decreased strength;Decreased range of motion;Decreased activity tolerance;Decreased  mobility;Decreased balance;Decreased coordination;Decreased cognition;Decreased knowledge of use of DME;Decreased safety awareness  PT Treatment Interventions Gait training;Therapeutic activities;Therapeutic exercise;Stair training;Functional mobility training;DME instruction;Balance training   PT Goals (Current goals can be found in the Care Plan section) Acute Rehab PT Goals Patient Stated Goal: pt would rather go home, but realizes she may be too weak PT Goal Formulation: With patient Time For Goal Achievement: 01/03/15 Potential to Achieve Goals: Fair    Frequency Min 2X/week   Barriers to discharge        Co-evaluation               End of Session   Activity Tolerance: Patient tolerated treatment well Patient left: with nursing/sitter in room;in bed           Time: 1610-9604 PT Time Calculation (min) (ACUTE ONLY): 25 min   Charges:   PT Evaluation $Initial PT Evaluation Tier I: 1 Procedure     PT G Codes:       Loran Senters,  PT, DPT [phone]  Malachi Pro 12/20/2014, 3:36 PM

## 2014-12-20 NOTE — Progress Notes (Signed)
ANTIBIOTIC CONSULT NOTE - FOLLOW UP   Pharmacy Consult for Meropenm Indication: ESBL UTI/Pseudomonas PNA  Allergies  Allergen Reactions  . Codeine     Has tolerated oxycodone in the past.    Patient Measurements: Height:  (170.2 cm) Weight: 228 lb 9.9 oz (103.7 kg) IBW/kg (Calculated) : 61.6   Vital Signs: Temp: 98.8 F (37.1 C) (09/11 0700) BP: 170/88 mmHg (09/11 0700) Pulse Rate: 82 (09/11 0700) Intake/Output from previous day: 09/10 0701 - 09/11 0700 In: 1412.9 [NG/GT:1112.9; IV Piggyback:300] Out: 1702 [Urine:1700; Stool:2] Intake/Output from this shift: Total I/O In: 152.5 [NG/GT:152.5] Out: -   Labs:  Recent Labs  12/18/14 1002 12/19/14 0445 12/20/14 0351  WBC  --   --  5.2  HGB  --  9.2* 9.1*  PLT  --   --  97*  CREATININE 1.00 0.97 0.78   Estimated Creatinine Clearance: 83.3 mL/min (by C-G formula based on Cr of 0.78). No results for input(s): VANCOTROUGH, VANCOPEAK, VANCORANDOM, GENTTROUGH, GENTPEAK, GENTRANDOM, TOBRATROUGH, TOBRAPEAK, TOBRARND, AMIKACINPEAK, AMIKACINTROU, AMIKACIN in the last 72 hours.   Microbiology: Recent Results (from the past 720 hour(s))  Blood Culture (routine x 2)     Status: None   Collection Time: 12/13/14  9:25 AM  Result Value Ref Range Status   Specimen Description BLOOD LEFT ARM  Final   Special Requests BOTTLES DRAWN AEROBIC AND ANAEROBIC  Final   Culture  Setup Time   Final    GRAM POSITIVE COCCI IN CLUSTERS ANAEROBIC BOTTLE ONLY CRITICAL RESULT CALLED TO, READ BACK BY AND VERIFIED WITH: LESLIE LEWIS 12/14/2014 0630 [facility] CONFIRMED BY [facility]    Culture   Final    COAGULASE NEGATIVE STAPHYLOCOCCUS ANAEROBIC BOTTLE ONLY Results consistent with contamination.    Report Status 12/18/2014 FINAL  Final  Blood Culture (routine x 2)     Status: None   Collection Time: 12/13/14  9:35 AM  Result Value Ref Range Status   Specimen Description BLOOD RIGHT FORARM  Final   Special Requests BOTTLES DRAWN AEROBIC AND  ANAEROBIC  Final   Culture NO GROWTH 5 DAYS  Final   Report Status 12/18/2014 FINAL  Final  Urine culture     Status: None   Collection Time: 12/13/14  9:35 AM  Result Value Ref Range Status   Specimen Description URINE, RANDOM  Final   Special Requests NONE  Final   Culture   Final    >=100,000 COLONIES/mL ESCHERICHIA COLI ESBL-EXTENDED SPECTRUM BETA LACTAMASE-THE ORGANISM IS RESISTANT TO PENICILLINS, CEPHALOSPORINS AND AZTREONAM ACCORDING TO CLSI M100-S15 VOL.25 N01 JAN 2005. Results Called to: Derald Macleod AT 1119 12/15/14 CTJ    Report Status 12/15/2014 FINAL  Final   Organism ID, Bacteria ESCHERICHIA COLI  Final      Susceptibility   Escherichia coli - MIC*    AMPICILLIN >=32 RESISTANT Resistant     CEFTAZIDIME 16 RESISTANT Resistant     CEFAZOLIN >=64 RESISTANT Resistant     CEFTRIAXONE 32 RESISTANT Resistant     GENTAMICIN <=1 SENSITIVE Sensitive     IMIPENEM <=0.25 SENSITIVE Sensitive     TRIMETH/SULFA >=320 RESISTANT Resistant     Extended ESBL POSITIVE Resistant     CIPROFLOXACIN Value in next row Sensitive      SENSITIVE<=0.25    NITROFURANTOIN Value in next row Sensitive      SENSITIVE<=16    PIP/TAZO Value in next row Resistant      RESISTANT>=128    ERTAPENEM Value in next row  Sensitive      SENSITIVE<=0.5    * >=100,000 COLONIES/mL ESCHERICHIA COLI  MRSA PCR Screening     Status: Abnormal   Collection Time: 12/13/14 12:20 PM  Result Value Ref Range Status   MRSA by PCR POSITIVE (A) NEGATIVE Final    Comment:        The GeneXpert MRSA Assay (FDA approved for NASAL specimens only), is one component of a comprehensive MRSA colonization surveillance program. It is not intended to diagnose MRSA infection nor to guide or monitor treatment for MRSA infections. CRITICAL RESULT CALLED TO, READ BACK BY AND VERIFIED WITH: ADAM SCARBORO @1338  ON 12/13/14 BY HP   Culture, expectorated sputum-assessment     Status: None   Collection Time: 12/15/14 10:49  AM  Result Value Ref Range Status   Specimen Description ENDOTRACHEAL  Final   Special Requests NONE  Final   Sputum evaluation THIS SPECIMEN IS ACCEPTABLE FOR SPUTUM CULTURE  Final   Report Status 12/16/2014 FINAL  Final  Culture, respiratory (NON-Expectorated)     Status: None   Collection Time: 12/15/14 10:49 AM  Result Value Ref Range Status   Specimen Description ENDOTRACHEAL  Final   Special Requests NONE Reflexed from [id number]  Final   Gram Stain   Final    FEW WBC SEEN FEW GRAM NEGATIVE RODS FEW GRAM POSITIVE COCCI IN PAIRS FAIR SPECIMEN - 70-80% WBCS    Culture   Final    MODERATE GROWTH CITROBACTER SPECIES HEAVY GROWTH PSEUDOMONAS AERUGINOSA MULTI-DRUG RESISTANT ORGANISM CRITICAL RESULT CALLED TO, READ BACK BY AND VERIFIED WITH: [facility] BORBA 12/18/14 1042 JGF    Report Status 12/19/2014 FINAL  Final   Organism ID, Bacteria PSEUDOMONAS AERUGINOSA  Final   Organism ID, Bacteria CITROBACTER SPECIES  Final      Susceptibility   Pseudomonas aeruginosa - MIC*    CEFTAZIDIME >=64 RESISTANT Resistant     CIPROFLOXACIN >=4 RESISTANT Resistant     GENTAMICIN >=16 RESISTANT Resistant     IMIPENEM 1 SENSITIVE Sensitive     PIP/TAZO Value in next row Resistant      RESISTANT>=128    * HEAVY GROWTH PSEUDOMONAS AERUGINOSA   Citrobacter species - MIC*    PIP/TAZO Value in next row Resistant      RESISTANT>=128    CEFTAZIDIME Value in next row Sensitive      SENSITIVE<=4    CEFEPIME Value in next row Sensitive      SENSITIVE<=1    CEFAZOLIN Value in next row Resistant      RESISTANT>=64    CEFTRIAXONE Value in next row Sensitive      SENSITIVE8    CIPROFLOXACIN Value in next row Sensitive      SENSITIVE<=0.25    GENTAMICIN Value in next row Sensitive      SENSITIVE<=1    IMIPENEM Value in next row Sensitive      SENSITIVE<=0.25    TRIMETH/SULFA Value in next row Sensitive      SENSITIVE<=20    * MODERATE GROWTH CITROBACTER SPECIES    Medical History: Past Medical  History  Diagnosis Date  . Tracheostomy in place   . Diabetes   . CKD (chronic kidney disease)   . OSA (obstructive sleep apnea)   . HTN (hypertension)   . MDD (major depressive disorder)     Medications:  Scheduled:  . antiseptic oral rinse  7 mL Mouth Rinse QID  . apixaban  5 mg Oral BID  . chlorhexidine gluconate  15 mL Mouth  Rinse BID  . escitalopram  5 mg Oral Daily  . feeding supplement (PRO-STAT SUGAR FREE 64)  30 mL Per Tube Daily  . feeding supplement (VITAL HIGH PROTEIN)  1,000 mL Per Tube Q24H  . free water  100 mL Per Tube 6 times per day  . insulin aspart  0-5 Units Subcutaneous QHS  . insulin aspart  0-9 Units Subcutaneous TID WC  . ipratropium-albuterol  3 mL Nebulization Q4H  . meropenem (MERREM) IV  1 g Intravenous 3 times per day  . pantoprazole sodium  40 mg Per Tube Daily  . potassium & sodium phosphates  1 packet Per Tube TID WC & HS  . QUEtiapine  50 mg Oral QHS  . senna-docusate  1 tablet Oral BID   Infusions:   PRN: acetaminophen **OR** acetaminophen, ALPRAZolam, alum & mag hydroxide-simeth, hydrALAZINE, HYDROcodone-acetaminophen, Influenza vac split quadrivalent PF, metoprolol, morphine injection, ondansetron **OR** ondansetron (ZOFRAN) IV, pneumococcal 23 valent vaccine  Assessment: 69 y/o F with acute respiratory failure on ciprofloxacin for ESBL E coli UTI. Tracheal aspirate culture growing Pseudomonas  Plan:  Will continue Merrem 1 g IV q8 hours. Patient needs a total of 14 days of therapy.   Cheryel Kyte D 12/20/2014,9:16 AM

## 2014-12-20 NOTE — Progress Notes (Signed)
Patient tolerated 2 hours of t. Collar wean in the morning and 3 hours in the afternoon

## 2014-12-20 NOTE — Progress Notes (Signed)
[facility] - [place] [facility]   PATIENT NAME: Rachel Brady    MR#:  [id number]  DATE OF BIRTH:  1945-06-27  SUBJECTIVE:  CHIEF COMPLAINT:   Chief Complaint  Patient presents with  . Respiratory Arrest  Patient is wide awake and alert. On trach collar.   REVIEW OF SYSTEMS:  Review of Systems  Constitutional: Negative for fever, chills and diaphoresis.  HENT: Negative for ear pain and tinnitus.   Eyes: Negative for blurred vision and pain.  Respiratory: Negative for cough, hemoptysis and wheezing.   Cardiovascular: Negative for chest pain and palpitations.  Gastrointestinal: Negative for heartburn, vomiting and abdominal pain.  Genitourinary: Negative for dysuria and urgency.  Musculoskeletal: Negative for myalgias, joint pain and neck pain.  Skin: Negative for itching and rash.  Neurological: Negative for dizziness and tremors.  Psychiatric/Behavioral: The patient is nervous/anxious.    DRUG ALLERGIES:   Allergies  Allergen Reactions  . Codeine     Has tolerated oxycodone in the past.   VITALS:  Blood pressure 130/78, pulse 94, temperature 99 F (37.2 C), temperature source Core (Comment), resp. rate 18, height  (1.702 m), weight 103.7 kg (228 lb 9.9 oz), SpO2 99 %. PHYSICAL EXAMINATION:  Physical Exam  Constitutional: She is oriented to person, place, and time and well-developed, well-nourished, and in no distress.  HENT:  Head: Normocephalic and atraumatic.  Trach in place  Eyes: Conjunctivae and EOM are normal. Pupils are equal, round, and reactive to light.  Neck: Normal range of motion. Neck supple. No tracheal deviation present. No thyromegaly present.  Cardiovascular: Normal rate, regular rhythm and normal heart sounds.   Pulmonary/Chest: Effort normal and breath sounds normal. No respiratory distress. She has no wheezes. She exhibits no tenderness.  Abdominal: Soft. Bowel sounds are normal. She exhibits no distension. There  is no tenderness.  Musculoskeletal: Normal range of motion.  Neurological: She is alert and oriented to person, place, and time. No cranial nerve deficit.  Skin: Skin is warm and dry. No rash noted.  Psychiatric: Mood and affect normal.   LABORATORY PANEL:   CBC  Recent Labs Lab 12/20/14 0351  WBC 5.2  HGB 9.1*  HCT 29.2*  PLT 97*   ------------------------------------------------------------------------------------------------------------------ Chemistries   Recent Labs Lab 12/20/14 0351  NA 139  K 3.7  CL 106  CO2 28  GLUCOSE 105*  BUN 20  CREATININE 0.78  CALCIUM 10.5*  MG 1.7   RADIOLOGY:  Dg Chest Port 1 View  12/20/2014   CLINICAL DATA:  69 year old female with a history of respiratory failure  EXAM: PORTABLE CHEST - 1 VIEW  COMPARISON:  12/19/2014, 12/15/2014, 12/14/2014  FINDINGS: Cardiomediastinal silhouette unchanged.  Surgical changes of median sternotomy and CABG.  Unchanged tracheostomy tube.  Persisting opacity at the left base with obscuration left hemidiaphragm, left costophrenic angle, and left heart border.  Similar opacity at the right base partially obscuring the right hemidiaphragm and right costophrenic angle.  Unchanged left-sided PICC.  IMPRESSION: Similar appearance the chest x-ray with persisting dense left basilar opacity, potentially a combination of lung volume loss/ atelectasis, consolidation/infection, and/or pleural effusion.  Similar appearance of the right basilar opacity, likely combination of pleural effusion atelectasis.  Similar appearance of cardiomegaly. Pericardial effusion not excluded. If there is concern for cardiac compromise, correlation with ECHO may be useful.  Unchanged left upper extremity PICC and tracheostomy tube.  Signed,  Yvone Neu. Loreta Ave, DO  [facility]  [facility]  Electronically Signed   By: Gilmer Mor D.O.   On: 12/20/2014 09:24   Dg Chest Port 1 View  12/19/2014    CLINICAL DATA:  PICC line placement  EXAM: PORTABLE CHEST - 1 VIEW  COMPARISON:  12/15/2014  FINDINGS: Postoperative changes in the mediastinum. Shallow inspiration. Prominent large moped cardiac silhouette which could be due to cardiac enlargement and/ or pericardial effusion. Small bilateral pleural effusions with basilar atelectasis or infiltration. Effusions are increasing since previous study. Tracheostomy. Enteric tube with tip in the left upper quadrant, likely in the stomach. Left PICC catheter with tip over the mid SVC region. No pneumothorax.  IMPRESSION: Appliances appear in satisfactory location. Enlargement of the cardiac silhouette. Increasing bilateral pleural effusions and basilar atelectasis.   Electronically Signed   By: Burman Nieves M.D.   On: 12/19/2014 21:26   ASSESSMENT AND PLAN:  This is a 69 year old female with a history of tracheostomy, atrial fibrillation, diabetes who presents with sepsis.  1. Sepsis with hypotension: secondary to urinary tract infection with ESBL ecoli Hypotension resolved  Appreciate ID input. Abx changed to Meropenem Continue meropenem for a total of 14 days. Status post PICC line 9/10  2. ESBL E.coli Urinary tract infection: changed to meropenem  Appreciate infectious disease input.    3. Healthcare acquired pneumonia:  Continue meropenem as trach suction sputum growing Pseudomonas & Citrobacter  4. Acute respiratory failure: on TCT per CCT, wean fio2 as tolerated Appreciate critical care management recommendations  5. Atrial fibrillation: Her heart rate is well controlled at this time  6. Diabetes: on tube feeds vital  and tolerating, sliding scale insulin for now.  7. Depression/anxiety: If able to give these medications via NG tube we will continue these.  8. Fungal skin infection: continue nystatin.  9. Nutrition- continue tube feeds. Speech pathology evaluation is pending  10. Deconditioning with PT as tolerated  Disposition-[facility] versus LTAC. We will follow up with case management  All the records are reviewed and case discussed with Care Management/Social Worker. Plan of care discussed with the patient, RN and critical care team   CODE STATUS: Full code  TOTAL TIME (CRITICAL CARE) TAKING CARE OF THIS PATIENT: 35 minutes.   More than 50% of the time was spent in counseling/coordination of care: YES  Did peer-peer Review with [employer] but they declined - they recommend appeal if CM/SW would like.  Ramonita Lab M.D on 12/20/2014 at 12:20 PM  Between 7am to 6pm - Pager - [phone]  After 6pm go to [email or web address] - password [email or web address] [facility]  [facility]  Office  [phone]  CC: Primary care physician; Dorothey Baseman, MD

## 2014-12-20 NOTE — Consult Note (Signed)
[facility] [place] [facility] Medicine Consultation      Date: 12/20/2014,   MRN# [id number] NOHEMY KOOP [birth year] Code Status:     Code Status Orders        Start     Ordered   12/13/14 1231  Full code   Continuous     12/13/14 1230     Hosp day:@LENGTHOFSTAYDAYS @ Referring MD: @     PCP:      AdmissionWeight: 211 lb (95.709 kg)                 CurrentWeight: 228 lb 9.9 oz (103.7 kg)      CHIEF COMPLAINT:  SOb and confusion  SOb  SUBJECTIVE   Patient awake and communicating with Passy-Muir valve this morning, status post PICC line placement for prolonged antibiotics treatment  wean fio2 as tolerated, Xanax as needed, s/p dubhoff tube for TFs Awaiting LTACH referral    PAST MEDICAL HISTORY   Past Medical History  Diagnosis Date  . Tracheostomy in place   . Diabetes   . CKD (chronic kidney disease)   . OSA (obstructive sleep apnea)   . HTN (hypertension)   . MDD (major depressive disorder)      SURGICAL HISTORY   History reviewed. No pertinent past surgical history.   FAMILY HISTORY   History reviewed. No pertinent family history.   SOCIAL HISTORY   Social History  Substance Use Topics  . Smoking status: Never Smoker   . Smokeless tobacco: None  . Alcohol Use: None     MEDICATIONS    Home Medication:  No current outpatient prescriptions on file.  Current Medication:  Current facility-administered medications:  .  acetaminophen (TYLENOL) tablet 650 mg, 650 mg, Oral, Q6H PRN **OR** acetaminophen (TYLENOL) suppository 650 mg, 650 mg, Rectal, Q6H PRN, Adrian Saran, MD .  ALPRAZolam Prudy Feeler) tablet 0.25 mg, 0.25 mg, Oral, Q8H PRN, Erin Fulling, MD, 0.25 mg at 12/19/14 2100 .  alum & mag hydroxide-simeth (MAALOX/MYLANTA) 200-200-20 MG/5ML suspension 30 mL, 30 mL, Oral, Q6H PRN, Adrian Saran, MD .  antiseptic oral rinse solution (CORINZ), 7 mL, Mouth Rinse, QID, Adrian Saran, MD, 7 mL at 12/20/14 0456 .  apixaban (ELIQUIS) tablet 5 mg, 5  mg, Oral, BID, Adrian Saran, MD, 5 mg at 12/20/14 0912 .  chlorhexidine gluconate (PERIDEX) 0.12 % solution 15 mL, 15 mL, Mouth Rinse, BID, Sital Mody, MD, 15 mL at 12/20/14 0832 .  escitalopram (LEXAPRO) tablet 5 mg, 5 mg, Oral, Daily, Adrian Saran, MD, 5 mg at 12/20/14 0913 .  feeding supplement (PRO-STAT SUGAR FREE 64) liquid 30 mL, 30 mL, Per Tube, Daily, Erin Fulling, MD, 30 mL at 12/20/14 0912 .  feeding supplement (VITAL HIGH PROTEIN) liquid 1,000 mL, 1,000 mL, Per Tube, Q24H, Erin Fulling, MD, 1,000 mL at 12/19/14 1313 .  free water 100 mL, 100 mL, Per Tube, 6 times per day, Erin Fulling, MD, 100 mL at 12/20/14 0832 .  hydrALAZINE (APRESOLINE) injection 20 mg, 20 mg, Intravenous, Q4H PRN, Erin Fulling, MD, 20 mg at 12/20/14 0924 .  HYDROcodone-acetaminophen (NORCO/VICODIN) 5-325 MG per tablet 1-2 tablet, 1-2 tablet, Oral, Q4H PRN, Adrian Saran, MD .  Influenza vac split quadrivalent PF (FLUARIX) injection 0.5 mL, 0.5 mL, Intramuscular, Prior to discharge, Delfino Lovett, MD .  insulin aspart (novoLOG) injection 0-5 Units, 0-5 Units, Subcutaneous, QHS, Adrian Saran, MD, 0 Units at 12/13/14 2149 .  insulin aspart (novoLOG) injection 0-9 Units, 0-9 Units, Subcutaneous, TID WC, Adrian Saran, MD, 1 Units  at 12/19/14 1623 .  ipratropium-albuterol (DUONEB) 0.5-2.5 (3) MG/3ML nebulizer solution 3 mL, 3 mL, Nebulization, Q4H, Erin Fulling, MD, 3 mL at 12/20/14 0747 .  meropenem (MERREM) 1 g in sodium chloride 0.9 % 100 mL IVPB, 1 g, Intravenous, 3 times per day, Erin Fulling, MD, 1 g at 12/20/14 0556 .  metoprolol (LOPRESSOR) injection 5 mg, 5 mg, Intravenous, Q3H PRN, Stephanie Acre, MD, 5 mg at 12/15/14 1703 .  morphine 2 MG/ML injection 2 mg, 2 mg, Intravenous, Q1H PRN, Erin Fulling, MD, 2 mg at 12/20/14 1610 .  ondansetron (ZOFRAN) tablet 4 mg, 4 mg, Oral, Q6H PRN **OR** ondansetron (ZOFRAN) injection 4 mg, 4 mg, Intravenous, Q6H PRN, Adrian Saran, MD .  pantoprazole sodium (PROTONIX) 40 mg/20 mL oral suspension 40 mg,  40 mg, Per Tube, Daily, Delfino Lovett, MD, 40 mg at 12/19/14 1746 .  pneumococcal 23 valent vaccine (PNU-IMMUNE) injection 0.5 mL, 0.5 mL, Intramuscular, Prior to discharge, Delfino Lovett, MD .  potassium & sodium phosphates (PHOS-NAK) 280-160-250 MG packet 1 packet, 1 packet, Per Tube, TID WC & HS, Erin Fulling, MD, 1 packet at 12/20/14 0912 .  QUEtiapine (SEROQUEL) tablet 50 mg, 50 mg, Oral, QHS, Adrian Saran, MD, 50 mg at 12/19/14 2100 .  senna-docusate (Senokot-S) tablet 1 tablet, 1 tablet, Oral, BID, Delfino Lovett, MD, 1 tablet at 12/20/14 0912    ALLERGIES   Codeine     REVIEW OF SYSTEMS   Review of Systems  Constitutional: Positive for malaise/fatigue.  Eyes: Negative.   Respiratory: Negative for shortness of breath.   Cardiovascular: Negative.   Gastrointestinal: Negative.   Musculoskeletal: Negative.   Neurological: Negative for headaches.  Psychiatric/Behavioral: The patient is nervous/anxious.   All other systems reviewed and are negative.    VS: BP 170/88 mmHg  Pulse 82  Temp(Src) 98.8 F (37.1 C) (Core (Comment))  Resp 17  Ht 5\' 7"  (1.702 m)  Wt 228 lb 9.9 oz (103.7 kg)  BMI 35.80 kg/m2  SpO2 100%     PHYSICAL EXAM  Physical Exam  Constitutional: No distress.  S/p trach  HENT:  Head: Normocephalic and atraumatic.  Mouth/Throat: No oropharyngeal exudate.  Eyes: EOM are normal. Pupils are equal, round, and reactive to light. No scleral icterus.  Neck: Normal range of motion. Neck supple.  Cardiovascular: Normal rate, regular rhythm and normal heart sounds.   No murmur heard. Pulmonary/Chest: No stridor. No respiratory distress. She has no wheezes. She has no rales.  Decreased breath sounds at the bilateral bases  Abdominal: Soft. Bowel sounds are normal. She exhibits no distension. There is no tenderness. There is no rebound.  Musculoskeletal: Normal range of motion. She exhibits no edema.  Neurological: She displays normal reflexes. Coordination normal.    Awake and communicating via Passy-Muir valve  Skin: Skin is warm. She is not diaphoretic.  Psychiatric: She has a normal mood and affect.        LABS    Recent Labs     12/17/14  1114  12/18/14  1002  12/19/14  0445  12/20/14  0351  HGB   --    --   9.2*  9.1*  HCT   --    --    --   29.2*  MCV   --    --    --   85.6  WBC   --    --    --   5.2  BUN  19  18  19   20  CREATININE  1.07*  1.00  0.97  0.78  GLUCOSE  92  111*  116*  105*  CALCIUM  10.7*  10.4*  10.7*  10.5*  ,    No results for input(s): PH in the last 72 hours.  Invalid input(s): PCO2, PO2, BASEEXCESS, BASEDEFICITE, TFT    CULTURE RESULTS   Recent Results (from the past 240 hour(s))  Blood Culture (routine x 2)     Status: None   Collection Time: 12/13/14  9:25 AM  Result Value Ref Range Status   Specimen Description BLOOD LEFT ARM  Final   Special Requests BOTTLES DRAWN AEROBIC AND ANAEROBIC  Final   Culture  Setup Time   Final    GRAM POSITIVE COCCI IN CLUSTERS ANAEROBIC BOTTLE ONLY CRITICAL RESULT CALLED TO, READ BACK BY AND VERIFIED WITH: LESLIE LEWIS 12/14/2014 0630 [facility] CONFIRMED BY [facility]    Culture   Final    COAGULASE NEGATIVE STAPHYLOCOCCUS ANAEROBIC BOTTLE ONLY Results consistent with contamination.    Report Status 12/18/2014 FINAL  Final  Blood Culture (routine x 2)     Status: None   Collection Time: 12/13/14  9:35 AM  Result Value Ref Range Status   Specimen Description BLOOD RIGHT FORARM  Final   Special Requests BOTTLES DRAWN AEROBIC AND ANAEROBIC  Final   Culture NO GROWTH 5 DAYS  Final   Report Status 12/18/2014 FINAL  Final  Urine culture     Status: None   Collection Time: 12/13/14  9:35 AM  Result Value Ref Range Status   Specimen Description URINE, RANDOM  Final   Special Requests NONE  Final   Culture   Final    >=100,000 COLONIES/mL ESCHERICHIA COLI ESBL-EXTENDED SPECTRUM BETA LACTAMASE-THE ORGANISM IS RESISTANT TO PENICILLINS, CEPHALOSPORINS AND AZTREONAM  ACCORDING TO CLSI M100-S15 VOL.25 N01 JAN 2005. Results Called to: Derald Macleod AT 1119 12/15/14 CTJ    Report Status 12/15/2014 FINAL  Final   Organism ID, Bacteria ESCHERICHIA COLI  Final      Susceptibility   Escherichia coli - MIC*    AMPICILLIN >=32 RESISTANT Resistant     CEFTAZIDIME 16 RESISTANT Resistant     CEFAZOLIN >=64 RESISTANT Resistant     CEFTRIAXONE 32 RESISTANT Resistant     GENTAMICIN <=1 SENSITIVE Sensitive     IMIPENEM <=0.25 SENSITIVE Sensitive     TRIMETH/SULFA >=320 RESISTANT Resistant     Extended ESBL POSITIVE Resistant     CIPROFLOXACIN Value in next row Sensitive      SENSITIVE<=0.25    NITROFURANTOIN Value in next row Sensitive      SENSITIVE<=16    PIP/TAZO Value in next row Resistant      RESISTANT>=128    ERTAPENEM Value in next row Sensitive      SENSITIVE<=0.5    * >=100,000 COLONIES/mL ESCHERICHIA COLI  MRSA PCR Screening     Status: Abnormal   Collection Time: 12/13/14 12:20 PM  Result Value Ref Range Status   MRSA by PCR POSITIVE (A) NEGATIVE Final    Comment:        The GeneXpert MRSA Assay (FDA approved for NASAL specimens only), is one component of a comprehensive MRSA colonization surveillance program. It is not intended to diagnose MRSA infection nor to guide or monitor treatment for MRSA infections. CRITICAL RESULT CALLED TO, READ BACK BY AND VERIFIED WITH: ADAM SCARBORO @1338  ON 12/13/14 BY HP   Culture, expectorated sputum-assessment     Status: None   Collection Time: 12/15/14  10:49 AM  Result Value Ref Range Status   Specimen Description ENDOTRACHEAL  Final   Special Requests NONE  Final   Sputum evaluation THIS SPECIMEN IS ACCEPTABLE FOR SPUTUM CULTURE  Final   Report Status 12/16/2014 FINAL  Final  Culture, respiratory (NON-Expectorated)     Status: None   Collection Time: 12/15/14 10:49 AM  Result Value Ref Range Status   Specimen Description ENDOTRACHEAL  Final   Special Requests NONE Reflexed from [id number]   Final   Gram Stain   Final    FEW WBC SEEN FEW GRAM NEGATIVE RODS FEW GRAM POSITIVE COCCI IN PAIRS FAIR SPECIMEN - 70-80% WBCS    Culture   Final    MODERATE GROWTH CITROBACTER SPECIES HEAVY GROWTH PSEUDOMONAS AERUGINOSA MULTI-DRUG RESISTANT ORGANISM CRITICAL RESULT CALLED TO, READ BACK BY AND VERIFIED WITH: [facility] BORBA 12/18/14 1042 JGF    Report Status 12/19/2014 FINAL  Final   Organism ID, Bacteria PSEUDOMONAS AERUGINOSA  Final   Organism ID, Bacteria CITROBACTER SPECIES  Final      Susceptibility   Pseudomonas aeruginosa - MIC*    CEFTAZIDIME >=64 RESISTANT Resistant     CIPROFLOXACIN >=4 RESISTANT Resistant     GENTAMICIN >=16 RESISTANT Resistant     IMIPENEM 1 SENSITIVE Sensitive     PIP/TAZO Value in next row Resistant      RESISTANT>=128    * HEAVY GROWTH PSEUDOMONAS AERUGINOSA   Citrobacter species - MIC*    PIP/TAZO Value in next row Resistant      RESISTANT>=128    CEFTAZIDIME Value in next row Sensitive      SENSITIVE<=4    CEFEPIME Value in next row Sensitive      SENSITIVE<=1    CEFAZOLIN Value in next row Resistant      RESISTANT>=64    CEFTRIAXONE Value in next row Sensitive      SENSITIVE8    CIPROFLOXACIN Value in next row Sensitive      SENSITIVE<=0.25    GENTAMICIN Value in next row Sensitive      SENSITIVE<=1    IMIPENEM Value in next row Sensitive      SENSITIVE<=0.25    TRIMETH/SULFA Value in next row Sensitive      SENSITIVE<=20    * MODERATE GROWTH CITROBACTER SPECIES          IMAGING    Dg Chest 1 View  12/13/2014   CLINICAL DATA:  Respiratory arrest twice this morning.  EXAM: CHEST  1 VIEW  COMPARISON:  08/02/2014.  FINDINGS: Stable enlarged cardiac silhouette and post CABG changes. Tracheostomy tube in satisfactory position. Dense airspace opacity in the lower half of the left lung. Patchy airspace opacity in the right mid and lower lung zones. Bilateral pleural effusions. Stable prosthetic heart valve and thoracic spine fixation  hardware. Thoracic spine degenerative changes.  IMPRESSION: 1. Dense left basilar atelectasis or pneumonia. 2. Right mid and lower lung zone pneumonia or alveolar edema. 3. Cardiomegaly and bilateral pleural effusions.   Electronically Signed   By: Beckie Salts M.D.   On: 12/13/2014 10:17   Ct Head Wo Contrast  12/13/2014   CLINICAL DATA:  Patient presents to the ED via EMS from [facility]. Patient stopped breathing twice with EMS but after they have approx. 5 breaths with bag mask ventilation patient's spontaneous breathing returned. This occurred twice enroute to hospital. On arrival patient was hypotensive and responding to pain only. Patient breathing on her own at this time. Eval AMS  EXAM: CT HEAD  WITHOUT CONTRAST  TECHNIQUE: Contiguous axial images were obtained from the base of the skull through the vertex without intravenous contrast.  COMPARISON:  04/03/2011  FINDINGS: There is moderate central and cortical atrophy. Periventricular white matter changes are consistent with small vessel disease. There is no intra or extra-axial fluid collection or mass lesion. The basilar cisterns and ventricles have a normal appearance. There is no CT evidence for acute infarction or hemorrhage. Bone windows show no acute findings. There is atherosclerotic calcification of the internal carotid arteries.  IMPRESSION: 1. Atrophy and small vessel disease. 2.  No evidence for acute intracranial abnormality.   Electronically Signed   By: Norva Pavlov M.D.   On: 12/13/2014 10:56   Dg Chest Port 1 View  12/20/2014   CLINICAL DATA:  69 year old female with a history of respiratory failure  EXAM: PORTABLE CHEST - 1 VIEW  COMPARISON:  12/19/2014, 12/15/2014, 12/14/2014  FINDINGS: Cardiomediastinal silhouette unchanged.  Surgical changes of median sternotomy and CABG.  Unchanged tracheostomy tube.  Persisting opacity at the left base with obscuration left hemidiaphragm, left costophrenic angle, and left heart border.   Similar opacity at the right base partially obscuring the right hemidiaphragm and right costophrenic angle.  Unchanged left-sided PICC.  IMPRESSION: Similar appearance the chest x-ray with persisting dense left basilar opacity, potentially a combination of lung volume loss/ atelectasis, consolidation/infection, and/or pleural effusion.  Similar appearance of the right basilar opacity, likely combination of pleural effusion atelectasis.  Similar appearance of cardiomegaly. Pericardial effusion not excluded. If there is concern for cardiac compromise, correlation with ECHO may be useful.  Unchanged left upper extremity PICC and tracheostomy tube.  Signed,  Yvone Neu. Loreta Ave, DO  [facility]  [facility]   Electronically Signed   By: Gilmer Mor D.O.   On: 12/20/2014 09:24   Dg Chest Port 1 View  12/19/2014   CLINICAL DATA:  PICC line placement  EXAM: PORTABLE CHEST - 1 VIEW  COMPARISON:  12/15/2014  FINDINGS: Postoperative changes in the mediastinum. Shallow inspiration. Prominent large moped cardiac silhouette which could be due to cardiac enlargement and/ or pericardial effusion. Small bilateral pleural effusions with basilar atelectasis or infiltration. Effusions are increasing since previous study. Tracheostomy. Enteric tube with tip in the left upper quadrant, likely in the stomach. Left PICC catheter with tip over the mid SVC region. No pneumothorax.  IMPRESSION: Appliances appear in satisfactory location. Enlargement of the cardiac silhouette. Increasing bilateral pleural effusions and basilar atelectasis.   Electronically Signed   By: Burman Nieves M.D.   On: 12/19/2014 21:26   Dg Chest Port 1 View  12/15/2014   CLINICAL DATA:  Respiratory distress  EXAM: PORTABLE CHEST - 1 VIEW  COMPARISON:  12/14/2014  FINDINGS: There is a tracheostomy tube in satisfactory position. There is bilateral interstitial and alveolar airspace opacities. There is a trace right  pleural effusion. There is a moderate left pleural effusion with associated airspace disease likely reflecting atelectasis. There is prominence of the central pulmonary vasculature. There is no pneumothorax. There is stable cardiomegaly. There is evidence of prior CABG. There is no acute osseous abnormality.  IMPRESSION: 1. Overall findings most consistent with CHF. 2. Moderate left pleural effusion with associated airspace disease likely reflecting atelectasis.   Electronically Signed   By: Elige Ko   On: 12/15/2014 09:20   Portable Chest 1 View  12/14/2014   CLINICAL DATA:  69 year old female with respiratory failure, sepsis. Initial encounter.  EXAM: PORTABLE CHEST -  1 VIEW  COMPARISON:  12/13/2014 and earlier.  FINDINGS: Portable AP semi upright view at 0555 hours. Stable tracheostomy tube. Stable severe cardiomegaly. Other mediastinal contours are stable, suggestion of some central pulmonary artery enlargement. Mildly decreased pulmonary vascularity. No pneumothorax. Continued dense retrocardiac opacity. Less veiling opacity at the right lung base. Chronic bilateral lateral rib fractures.  IMPRESSION: 1. Severe cardiomegaly with dense lower lobe collapse or consolidation greater on the left. 2. Mildly decreased pulmonary vascularity and veiling opacity at the right lung base suggesting some improvement in pulmonary edema and right pleural effusion.   Electronically Signed   By: Odessa Fleming M.D.   On: 12/14/2014 07:57   Dg Abd Portable 1v  12/17/2014   CLINICAL DATA:  NG tube placement.  EXAM: PORTABLE ABDOMEN - 1 VIEW  COMPARISON:  None.  FINDINGS: Feeding tube tip is in the fundus of the stomach. Chronic pleural effusions and basilar atelectasis. Moderate air in the nondistended colon.  IMPRESSION: Feeding tube tip is in the fundus of the stomach.   Electronically Signed   By: Francene Boyers M.D.   On: 12/17/2014 14:55   MAJOR EVENTS/TEST RESULTS: Admitted 9/4 for resp failure and sepsis 9/5 placed  back on vent likely mucus plugs   INDWELLING DEVICES::  MICRO DATA: MRSA PCR >>+MRSA Urine >> E. Coli(ESBL) Blood>> Resp >>moderate citrobacter and heavy pseudomonas>>  ANTIMICROBIALS:  Zosyn 9/4>>9/6 Vancomycin 9/4>>9/6 cipro 9/6>>9/9 Meropenem 9/9>>     ASSESSMENT/PLAN    69 yo white female with acute resp failure from acute encephalopathy from acute UTI and GPC bacteremia with sepsis, now with resp failure placed back on vent likley due to mucus plugs/pneumonia-sputum culture pending  1.Respiratory Failure -Status post PICC line placement, will continue with prolonged antibiotics for UTI and tracheitis/respiratory infection -continue Bronchodilator Therapy -Continue with trach collar trials daily, plan today is for 2 hours in the morning and then 2 hours in the afternoon; double each day moving forward  2. ?Tracheitis - resp cx with citrobacter and Pseudomonas, cont with meropenem  3.UTI sepsis ESBL -continue abx    I have personally obtained a history, examined the patient, evaluated laboratory and independently reviewed imaging results, formulated the assessment and plan and placed orders.  The Patient requires high complexity decision making for assessment and support, frequent evaluation and titration of therapies, application of advanced monitoring technologies and extensive interpretation of multiple databases. Time spent with patient 35 minutes. Recommend LTACH referral    Stephanie Acre, MD [facility] Pager [phone] (please enter 7-digits) On Call Pager - [phone] (please enter 7-digits)

## 2014-12-20 NOTE — Progress Notes (Signed)
PHARMACY - CRITICAL CARE PROGRESS NOTE  Pharmacy Consult for Constipation Prevention/ Electrolyte management    Allergies  Allergen Reactions  . Codeine     Has tolerated oxycodone in the past.    Patient Measurements: Height: 5\' 7"  (170.2 cm) Weight: 228 lb 9.9 oz (103.7 kg) IBW/kg (Calculated) : 61.6  Vital Signs: Temp: 99.5 F (37.5 C) (09/11 0500) BP: 123/69 mmHg (09/11 0500) Pulse Rate: 75 (09/11 0500) Intake/Output from previous day: 09/10 0701 - 09/11 0700 In: 1312.9 [NG/GT:1112.9; IV Piggyback:200] Out: 1702 [Urine:1700; Stool:2] Intake/Output from this shift: Total I/O In: 1312.9 [NG/GT:1112.9; IV Piggyback:200] Out: 1200 [Urine:1200] Vent settings for last 24 hours: Vent Mode:  [-] PSV FiO2 (%):  [30 %-35 %] 30 % PEEP:  [5 cmH20] 5 cmH20 Pressure Support:  [8 cmH20-10 cmH20] 10 cmH20  Labs:  Recent Labs  12/18/14 1002 12/18/14 1954 12/19/14 0445 12/20/14 0351  WBC  --   --   --  5.2  HGB  --   --  9.2* 9.1*  HCT  --   --   --  29.2*  PLT  --   --   --  97*  CREATININE 1.00  --  0.97 0.78  MG 1.2* 1.9 1.8 1.7  PHOS 2.7  --  2.5 2.4*   Estimated Creatinine Clearance: 83.3 mL/min (by C-G formula based on Cr of 0.78).   Recent Labs  12/19/14 1613 12/19/14 2053 12/19/14 2357  GLUCAP 130* 88 96    Microbiology: Recent Results (from the past 720 hour(s))  Blood Culture (routine x 2)     Status: None   Collection Time: 12/13/14  9:25 AM  Result Value Ref Range Status   Specimen Description BLOOD LEFT ARM  Final   Special Requests BOTTLES DRAWN AEROBIC AND ANAEROBIC  Final   Culture  Setup Time   Final    GRAM POSITIVE COCCI IN CLUSTERS ANAEROBIC BOTTLE ONLY CRITICAL RESULT CALLED TO, READ BACK BY AND VERIFIED WITH: LESLIE LEWIS 12/14/2014 0630 [facility] CONFIRMED BY [facility]    Culture   Final    COAGULASE NEGATIVE STAPHYLOCOCCUS ANAEROBIC BOTTLE ONLY Results consistent with contamination.    Report Status 12/18/2014 FINAL  Final  Blood  Culture (routine x 2)     Status: None   Collection Time: 12/13/14  9:35 AM  Result Value Ref Range Status   Specimen Description BLOOD RIGHT FORARM  Final   Special Requests BOTTLES DRAWN AEROBIC AND ANAEROBIC  Final   Culture NO GROWTH 5 DAYS  Final   Report Status 12/18/2014 FINAL  Final  Urine culture     Status: None   Collection Time: 12/13/14  9:35 AM  Result Value Ref Range Status   Specimen Description URINE, RANDOM  Final   Special Requests NONE  Final   Culture   Final    >=100,000 COLONIES/mL ESCHERICHIA COLI ESBL-EXTENDED SPECTRUM BETA LACTAMASE-THE ORGANISM IS RESISTANT TO PENICILLINS, CEPHALOSPORINS AND AZTREONAM ACCORDING TO CLSI M100-S15 VOL.25 N01 JAN 2005. Results Called to: Derald Macleod AT 1610 12/15/14 CTJ    Report Status 12/15/2014 FINAL  Final   Organism ID, Bacteria ESCHERICHIA COLI  Final      Susceptibility   Escherichia coli - MIC*    AMPICILLIN >=32 RESISTANT Resistant     CEFTAZIDIME 16 RESISTANT Resistant     CEFAZOLIN >=64 RESISTANT Resistant     CEFTRIAXONE 32 RESISTANT Resistant     GENTAMICIN <=1 SENSITIVE Sensitive     IMIPENEM <=0.25 SENSITIVE Sensitive  TRIMETH/SULFA >=320 RESISTANT Resistant     Extended ESBL POSITIVE Resistant     CIPROFLOXACIN Value in next row Sensitive      SENSITIVE<=0.25    NITROFURANTOIN Value in next row Sensitive      SENSITIVE<=16    PIP/TAZO Value in next row Resistant      RESISTANT>=128    ERTAPENEM Value in next row Sensitive      SENSITIVE<=0.5    * >=100,000 COLONIES/mL ESCHERICHIA COLI  MRSA PCR Screening     Status: Abnormal   Collection Time: 12/13/14 12:20 PM  Result Value Ref Range Status   MRSA by PCR POSITIVE (A) NEGATIVE Final    Comment:        The GeneXpert MRSA Assay (FDA approved for NASAL specimens only), is one component of a comprehensive MRSA colonization surveillance program. It is not intended to diagnose MRSA infection nor to guide or monitor treatment for MRSA  infections. CRITICAL RESULT CALLED TO, READ BACK BY AND VERIFIED WITH: ADAM SCARBORO  ON 12/13/14 BY HP   Culture, expectorated sputum-assessment     Status: None   Collection Time: 12/15/14 10:49 AM  Result Value Ref Range Status   Specimen Description ENDOTRACHEAL  Final   Special Requests NONE  Final   Sputum evaluation THIS SPECIMEN IS ACCEPTABLE FOR SPUTUM CULTURE  Final   Report Status 12/16/2014 FINAL  Final  Culture, respiratory (NON-Expectorated)     Status: None   Collection Time: 12/15/14 10:49 AM  Result Value Ref Range Status   Specimen Description ENDOTRACHEAL  Final   Special Requests NONE Reflexed from [id number]  Final   Gram Stain   Final    FEW WBC SEEN FEW GRAM NEGATIVE RODS FEW GRAM POSITIVE COCCI IN PAIRS FAIR SPECIMEN - 70-80% WBCS    Culture   Final    MODERATE GROWTH CITROBACTER SPECIES HEAVY GROWTH PSEUDOMONAS AERUGINOSA MULTI-DRUG RESISTANT ORGANISM CRITICAL RESULT CALLED TO, READ BACK BY AND VERIFIED WITH: [facility] BORBA 12/18/14 1042 JGF    Report Status 12/19/2014 FINAL  Final   Organism ID, Bacteria PSEUDOMONAS AERUGINOSA  Final   Organism ID, Bacteria CITROBACTER SPECIES  Final      Susceptibility   Pseudomonas aeruginosa - MIC*    CEFTAZIDIME >=64 RESISTANT Resistant     CIPROFLOXACIN >=4 RESISTANT Resistant     GENTAMICIN >=16 RESISTANT Resistant     IMIPENEM 1 SENSITIVE Sensitive     PIP/TAZO Value in next row Resistant      RESISTANT>=128    * HEAVY GROWTH PSEUDOMONAS AERUGINOSA   Citrobacter species - MIC*    PIP/TAZO Value in next row Resistant      RESISTANT>=128    CEFTAZIDIME Value in next row Sensitive      SENSITIVE<=4    CEFEPIME Value in next row Sensitive      SENSITIVE<=1    CEFAZOLIN Value in next row Resistant      RESISTANT>=64    CEFTRIAXONE Value in next row Sensitive      SENSITIVE8    CIPROFLOXACIN Value in next row Sensitive      SENSITIVE<=0.25    GENTAMICIN Value in next row Sensitive      SENSITIVE<=1     IMIPENEM Value in next row Sensitive      SENSITIVE<=0.25    TRIMETH/SULFA Value in next row Sensitive      SENSITIVE<=20    * MODERATE GROWTH CITROBACTER SPECIES    Medications:  Scheduled:  . antiseptic oral rinse  7 mL  Mouth Rinse QID  . apixaban  5 mg Oral BID  . chlorhexidine gluconate  15 mL Mouth Rinse BID  . escitalopram  5 mg Oral Daily  . feeding supplement (PRO-STAT SUGAR FREE 64)  30 mL Per Tube Daily  . feeding supplement (VITAL HIGH PROTEIN)  1,000 mL Per Tube Q24H  . free water  100 mL Per Tube 6 times per day  . insulin aspart  0-5 Units Subcutaneous QHS  . insulin aspart  0-9 Units Subcutaneous TID WC  . ipratropium-albuterol  3 mL Nebulization Q4H  . meropenem (MERREM) IV  1 g Intravenous 3 times per day  . pantoprazole sodium  40 mg Per Tube Daily  . QUEtiapine  50 mg Oral QHS  . senna-docusate  1 tablet Oral BID   Infusions:    PRN: acetaminophen **OR** acetaminophen, ALPRAZolam, alum & mag hydroxide-simeth, hydrALAZINE, HYDROcodone-acetaminophen, Influenza vac split quadrivalent PF, metoprolol, morphine injection, ondansetron **OR** ondansetron (ZOFRAN) IV, pneumococcal 23 valent vaccine  Assessment: 69 y/o F with acute respiratory failure, sepsis, ESBL UTI.   Electrolytes are within normal limits.   Plan:  Constipation: Will continue senna/docusate 1 tablet bid. Will assess BM in am. Electrolytes: Will order electrolyte panel with am labs.    9/11 Electrolytes WNL except PO4 2.4.  PhosNaK x 2 doses ordered. Recheck in AM.   Fulton Reek, PharmD, BCPS  12/20/2014

## 2014-12-21 LAB — CBC
HEMATOCRIT: 26.5 % — AB (ref 35.0–47.0)
HEMOGLOBIN: 8.4 g/dL — AB (ref 12.0–16.0)
MCH: 27 pg (ref 26.0–34.0)
MCHC: 31.7 g/dL — AB (ref 32.0–36.0)
MCV: 85.1 fL (ref 80.0–100.0)
Platelets: 93 10*3/uL — ABNORMAL LOW (ref 150–440)
RBC: 3.12 MIL/uL — ABNORMAL LOW (ref 3.80–5.20)
RDW: 21.2 % — AB (ref 11.5–14.5)
WBC: 4.3 10*3/uL (ref 3.6–11.0)

## 2014-12-21 LAB — BASIC METABOLIC PANEL
Anion gap: 6 (ref 5–15)
BUN: 24 mg/dL — AB (ref 6–20)
CALCIUM: 10.6 mg/dL — AB (ref 8.9–10.3)
CO2: 30 mmol/L (ref 22–32)
CREATININE: 0.75 mg/dL (ref 0.44–1.00)
Chloride: 104 mmol/L (ref 101–111)
GFR calc non Af Amer: >60 mL/min (ref >60)
Glucose, Bld: 132 mg/dL — ABNORMAL HIGH (ref 65–99)
Potassium: 3.4 mmol/L — ABNORMAL LOW (ref 3.5–5.1)
SODIUM: 140 mmol/L (ref 135–145)

## 2014-12-21 LAB — GLUCOSE, CAPILLARY
GLUCOSE-CAPILLARY: 111 mg/dL — AB (ref 65–99)
Glucose-Capillary: 98 mg/dL (ref 65–99)
Glucose-Capillary: 125 mg/dL — ABNORMAL HIGH (ref 65–99)
Glucose-Capillary: 153 mg/dL — ABNORMAL HIGH (ref 65–99)

## 2014-12-21 LAB — MAGNESIUM: MAGNESIUM: 1.6 mg/dL — AB (ref 1.7–2.4)

## 2014-12-21 LAB — PHOSPHORUS: PHOSPHORUS: 2.6 mg/dL (ref 2.5–4.6)

## 2014-12-21 MED ORDER — GLUCERNA SHAKE PO LIQD
237.0000 mL | Freq: Three times a day (TID) | ORAL | Status: DC
  Administered 2014-12-21 – 2014-12-30 (×15): 237 mL via ORAL

## 2014-12-21 MED ORDER — MAGNESIUM SULFATE 2 GM/50ML IV SOLN
2.0000 g | Freq: Once | INTRAVENOUS | Status: AC
  Administered 2014-12-21: 2 g via INTRAVENOUS
  Filled 2014-12-21: qty 50

## 2014-12-21 MED ORDER — IPRATROPIUM-ALBUTEROL 0.5-2.5 (3) MG/3ML IN SOLN
3.0000 mL | Freq: Four times a day (QID) | RESPIRATORY_TRACT | Status: DC
  Administered 2014-12-21 – 2014-12-25 (×16): 3 mL via RESPIRATORY_TRACT
  Filled 2014-12-21 (×17): qty 3

## 2014-12-21 MED ORDER — POTASSIUM CHLORIDE CRYS ER 20 MEQ PO TBCR
20.0000 meq | EXTENDED_RELEASE_TABLET | Freq: Once | ORAL | Status: AC
  Administered 2014-12-21: 20 meq via ORAL
  Filled 2014-12-21: qty 1

## 2014-12-21 MED ORDER — PANTOPRAZOLE SODIUM 40 MG PO TBEC
40.0000 mg | DELAYED_RELEASE_TABLET | Freq: Every day | ORAL | Status: DC
  Administered 2014-12-21 – 2014-12-27 (×6): 40 mg via ORAL
  Filled 2014-12-21 (×6): qty 1

## 2014-12-21 NOTE — Progress Notes (Signed)
RN made Dr. Sung Amabile aware that patient is asking for food and wants to eat. Dr. Sung Amabile present and gave order for speech eval.

## 2014-12-21 NOTE — Progress Notes (Signed)
PHARMACY - CRITICAL CARE PROGRESS NOTE  Pharmacy Consult for Constipation Prevention/ Electrolyte management    Allergies  Allergen Reactions  . Codeine     Has tolerated oxycodone in the past.    Patient Measurements: Height:  (170.2 cm) Weight: 230 lb 2.6 oz (104.4 kg) IBW/kg (Calculated) : 61.6  Vital Signs: Temp: 99.7 F (37.6 C) (09/12 0500) Temp Source: Core (Comment) (09/11 2000) BP: 100/57 mmHg (09/12 0600) Pulse Rate: 82 (09/12 0600) Intake/Output from previous day: 09/11 0701 - 09/12 0700 In: 2172.3 [NG/GT:1872.3; IV Piggyback:300] Out: 950 [Urine:950] Intake/Output from this shift: Total I/O In: 1099.7 [NG/GT:899.7; IV Piggyback:200] Out: 500 [Urine:500] Vent settings for last 24 hours: Vent Mode:  [-] PSV FiO2 (%):  [30 %-35 %] 30 % PEEP:  [5 cmH20] 5 cmH20 Pressure Support:  [10 cmH20] 10 cmH20  Labs:  Recent Labs  12/19/14 0445 12/20/14 0351 12/21/14 0523  WBC  --  5.2 4.3  HGB 9.2* 9.1* 8.4*  HCT  --  29.2* 26.5*  PLT  --  97* 93*  CREATININE 0.97 0.78 0.75  MG 1.8 1.7 1.6*  PHOS 2.5 2.4* 2.6   Estimated Creatinine Clearance: 83.6 mL/min (by C-G formula based on Cr of 0.75).   Recent Labs  12/20/14 1133 12/20/14 1549 12/20/14 2124  GLUCAP 103* 133* 112*    Microbiology: Recent Results (from the past 720 hour(s))  Blood Culture (routine x 2)     Status: None   Collection Time: 12/13/14  9:25 AM  Result Value Ref Range Status   Specimen Description BLOOD LEFT ARM  Final   Special Requests BOTTLES DRAWN AEROBIC AND ANAEROBIC  Final   Culture  Setup Time   Final    GRAM POSITIVE COCCI IN CLUSTERS ANAEROBIC BOTTLE ONLY CRITICAL RESULT CALLED TO, READ BACK BY AND VERIFIED WITH: LESLIE LEWIS 12/14/2014 0630 [facility] CONFIRMED BY [facility]    Culture   Final    COAGULASE NEGATIVE STAPHYLOCOCCUS ANAEROBIC BOTTLE ONLY Results consistent with contamination.    Report Status 12/18/2014 FINAL  Final  Blood Culture (routine x 2)      Status: None   Collection Time: 12/13/14  9:35 AM  Result Value Ref Range Status   Specimen Description BLOOD RIGHT FORARM  Final   Special Requests BOTTLES DRAWN AEROBIC AND ANAEROBIC  Final   Culture NO GROWTH 5 DAYS  Final   Report Status 12/18/2014 FINAL  Final  Urine culture     Status: None   Collection Time: 12/13/14  9:35 AM  Result Value Ref Range Status   Specimen Description URINE, RANDOM  Final   Special Requests NONE  Final   Culture   Final    >=100,000 COLONIES/mL ESCHERICHIA COLI ESBL-EXTENDED SPECTRUM BETA LACTAMASE-THE ORGANISM IS RESISTANT TO PENICILLINS, CEPHALOSPORINS AND AZTREONAM ACCORDING TO CLSI M100-S15 VOL.25 N01 JAN 2005. Results Called to: Derald Macleod AT 1119 12/15/14 CTJ    Report Status 12/15/2014 FINAL  Final   Organism ID, Bacteria ESCHERICHIA COLI  Final      Susceptibility   Escherichia coli - MIC*    AMPICILLIN >=32 RESISTANT Resistant     CEFTAZIDIME 16 RESISTANT Resistant     CEFAZOLIN >=64 RESISTANT Resistant     CEFTRIAXONE 32 RESISTANT Resistant     GENTAMICIN <=1 SENSITIVE Sensitive     IMIPENEM <=0.25 SENSITIVE Sensitive     TRIMETH/SULFA >=320 RESISTANT Resistant     Extended ESBL POSITIVE Resistant     CIPROFLOXACIN Value in next row  Sensitive      SENSITIVE<=0.25    NITROFURANTOIN Value in next row Sensitive      SENSITIVE<=16    PIP/TAZO Value in next row Resistant      RESISTANT>=128    ERTAPENEM Value in next row Sensitive      SENSITIVE<=0.5    * >=100,000 COLONIES/mL ESCHERICHIA COLI  MRSA PCR Screening     Status: Abnormal   Collection Time: 12/13/14 12:20 PM  Result Value Ref Range Status   MRSA by PCR POSITIVE (A) NEGATIVE Final    Comment:        The GeneXpert MRSA Assay (FDA approved for NASAL specimens only), is one component of a comprehensive MRSA colonization surveillance program. It is not intended to diagnose MRSA infection nor to guide or monitor treatment for MRSA infections. CRITICAL  RESULT CALLED TO, READ BACK BY AND VERIFIED WITH: ADAM SCARBORO @1338  ON 12/13/14 BY HP   Culture, expectorated sputum-assessment     Status: None   Collection Time: 12/15/14 10:49 AM  Result Value Ref Range Status   Specimen Description ENDOTRACHEAL  Final   Special Requests NONE  Final   Sputum evaluation THIS SPECIMEN IS ACCEPTABLE FOR SPUTUM CULTURE  Final   Report Status 12/16/2014 FINAL  Final  Culture, respiratory (NON-Expectorated)     Status: None   Collection Time: 12/15/14 10:49 AM  Result Value Ref Range Status   Specimen Description ENDOTRACHEAL  Final   Special Requests NONE Reflexed from [id number]  Final   Gram Stain   Final    FEW WBC SEEN FEW GRAM NEGATIVE RODS FEW GRAM POSITIVE COCCI IN PAIRS FAIR SPECIMEN - 70-80% WBCS    Culture   Final    MODERATE GROWTH CITROBACTER SPECIES HEAVY GROWTH PSEUDOMONAS AERUGINOSA MULTI-DRUG RESISTANT ORGANISM CRITICAL RESULT CALLED TO, READ BACK BY AND VERIFIED WITH: [facility] BORBA 12/18/14 1042 JGF    Report Status 12/19/2014 FINAL  Final   Organism ID, Bacteria PSEUDOMONAS AERUGINOSA  Final   Organism ID, Bacteria CITROBACTER SPECIES  Final      Susceptibility   Pseudomonas aeruginosa - MIC*    CEFTAZIDIME >=64 RESISTANT Resistant     CIPROFLOXACIN >=4 RESISTANT Resistant     GENTAMICIN >=16 RESISTANT Resistant     IMIPENEM 1 SENSITIVE Sensitive     PIP/TAZO Value in next row Resistant      RESISTANT>=128    * HEAVY GROWTH PSEUDOMONAS AERUGINOSA   Citrobacter species - MIC*    PIP/TAZO Value in next row Resistant      RESISTANT>=128    CEFTAZIDIME Value in next row Sensitive      SENSITIVE<=4    CEFEPIME Value in next row Sensitive      SENSITIVE<=1    CEFAZOLIN Value in next row Resistant      RESISTANT>=64    CEFTRIAXONE Value in next row Sensitive      SENSITIVE8    CIPROFLOXACIN Value in next row Sensitive      SENSITIVE<=0.25    GENTAMICIN Value in next row Sensitive      SENSITIVE<=1    IMIPENEM Value in next  row Sensitive      SENSITIVE<=0.25    TRIMETH/SULFA Value in next row Sensitive      SENSITIVE<=20    * MODERATE GROWTH CITROBACTER SPECIES    Medications:  Scheduled:  . antiseptic oral rinse  7 mL Mouth Rinse QID  . apixaban  5 mg Oral BID  . chlorhexidine gluconate  15 mL Mouth Rinse BID  .  escitalopram  5 mg Oral Daily  . feeding supplement (PRO-STAT SUGAR FREE 64)  30 mL Per Tube Daily  . feeding supplement (VITAL HIGH PROTEIN)  1,000 mL Per Tube Q24H  . free water  100 mL Per Tube 6 times per day  . insulin aspart  0-5 Units Subcutaneous QHS  . insulin aspart  0-9 Units Subcutaneous TID WC  . ipratropium-albuterol  3 mL Nebulization Q4H  . magnesium sulfate 1 - 4 g bolus IVPB  2 g Intravenous Once  . meropenem (MERREM) IV  1 g Intravenous 3 times per day  . pantoprazole sodium  40 mg Per Tube Daily  . potassium chloride  20 mEq Oral Once  . QUEtiapine  50 mg Oral QHS  . senna-docusate  1 tablet Oral BID   Infusions:    PRN: acetaminophen **OR** acetaminophen, ALPRAZolam, alum & mag hydroxide-simeth, hydrALAZINE, HYDROcodone-acetaminophen, Influenza vac split quadrivalent PF, metoprolol, morphine injection, ondansetron **OR** ondansetron (ZOFRAN) IV, pneumococcal 23 valent vaccine  Assessment: 69 y/o F with acute respiratory failure, sepsis, ESBL UTI.   Electrolytes are within normal limits.   Plan:  Constipation: Will continue senna/docusate 1 tablet bid. Will assess BM in am. Electrolytes: Will order electrolyte panel with am labs.    9/11 Electrolytes WNL except PO4 2.4.  PhosNaK x 2 doses ordered. Recheck in AM.   9/12 K+ 3.4 and Mg 1.2. KCl 20 mEq and magnesium sulfate 2 grams x1 ordered. Recheck labs in AM.   Fulton Reek, PharmD, BCPS  12/21/2014

## 2014-12-21 NOTE — Progress Notes (Signed)
[facility] - [place] at [facility]   PATIENT NAME: Rachel Brady    MR#:  [id number]  DATE OF BIRTH:  10-30-1945  SUBJECTIVE:  CHIEF COMPLAINT:   Chief Complaint  Patient presents with  . Respiratory Arrest  Patient is wide awake and alert. On trach collar. Passed bedside swallow evaluation and speech therapy swallow eval. Very hungry and wants to eat  REVIEW OF SYSTEMS:  Review of Systems  Constitutional: Negative for fever, chills and diaphoresis.  HENT: Negative for ear pain and tinnitus.   Eyes: Negative for blurred vision and pain.  Respiratory: Negative for cough, hemoptysis and wheezing.   Cardiovascular: Negative for chest pain and palpitations.  Gastrointestinal: Negative for heartburn, vomiting and abdominal pain.  Genitourinary: Negative for dysuria and urgency.  Musculoskeletal: Negative for myalgias, joint pain and neck pain.  Skin: Negative for itching and rash.  Neurological: Negative for dizziness and tremors.  Psychiatric/Behavioral: The patient is not nervous/anxious.    DRUG ALLERGIES:   Allergies  Allergen Reactions  . Codeine     Has tolerated oxycodone in the past.   VITALS:  Blood pressure 137/95, pulse 25, temperature 99 F (37.2 C), temperature source Core (Comment), resp. rate 17, height 5\' 7"  (1.702 m), weight 104.4 kg (230 lb 2.6 oz), SpO2 100 %. PHYSICAL EXAMINATION:  Physical Exam  Constitutional: She is oriented to person, place, and time and well-developed, well-nourished, and in no distress.  HENT:  Head: Normocephalic and atraumatic.  Trach in place  Eyes: Conjunctivae and EOM are normal. Pupils are equal, round, and reactive to light.  Neck: Normal range of motion. Neck supple. No tracheal deviation present. No thyromegaly present.  Cardiovascular: Normal rate, regular rhythm and normal heart sounds.   Pulmonary/Chest: Effort normal and breath sounds normal. No respiratory distress. She has no wheezes. She  exhibits no tenderness.  Abdominal: Soft. Bowel sounds are normal. She exhibits no distension. There is no tenderness.  Musculoskeletal: Normal range of motion.  Neurological: She is alert and oriented to person, place, and time. No cranial nerve deficit.  Skin: Skin is warm and dry. No rash noted.  Psychiatric: Mood and affect normal.   LABORATORY PANEL:   CBC  Recent Labs Lab 12/21/14 0523  WBC 4.3  HGB 8.4*  HCT 26.5*  PLT 93*   ------------------------------------------------------------------------------------------------------------------ Chemistries   Recent Labs Lab 12/21/14 0523  NA 140  K 3.4*  CL 104  CO2 30  GLUCOSE 132*  BUN 24*  CREATININE 0.75  CALCIUM 10.6*  MG 1.6*   RADIOLOGY:  No results found. ASSESSMENT AND PLAN:  This is a 69 year old female with a history of tracheostomy, atrial fibrillation, diabetes who presents with sepsis.  1. Sepsis with hypotension: secondary to urinary tract infection with ESBL ecoli Hypotension resolved  Appreciate ID input. Abx changed to Meropenem Continue meropenem for a total of 14 days until 01/01/2015 as per ID recommendations. Status post PICC line 9/10  2. ESBL E.coli Urinary tract infection: changed to meropenem  Appreciate infectious disease input.    3. Healthcare acquired pneumonia:  Continue meropenem as trach suction sputum growing Pseudomonas & Citrobacter  4. Acute respiratory failure: on TCT per CCT, wean fio2 as tolerated Appreciate critical care management recommendations  5. Atrial fibrillation: Her heart rate is well controlled at this time  6. Diabetes: Discontinued tube feeds   By mouth feeds today as tolerated, sliding scale insulin   7. Depression/anxiety: Anxiolytics as needed basis  8. Fungal skin infection:  continue nystatin.  9. Nutrition- discontinued tube feeds. Speech pathology is recommending by mouth feeds and medications as well   10 . Deconditioning with PT as  tolerated  Disposition-[facility]. We will follow up with case management  Anticipating discharge in 1-2 days   All the records are reviewed and case discussed with Care Management/Social Worker. Plan of care discussed with the patient, RN and critical care team   CODE STATUS: Full code  TOTAL TIME (CRITICAL CARE) TAKING CARE OF THIS PATIENT: 35 minutes.   More than 50% of the time was spent in counseling/coordination of care: YES  Did peer-peer Review with [employer] but they declined - they recommend appeal if CM/SW would like.  Ramonita Lab M.D on 12/21/2014 at 3:40 PM  Between 7am to 6pm - Pager - [phone]  After 6pm go to [email or web address] - password [email or web address] [facility]  [place]  Office  [phone]  CC: Primary care physician; Dorothey Baseman, MD

## 2014-12-21 NOTE — Progress Notes (Signed)
Spoke with Dr. Amado Coe on the phone per Speech Therapist request to receive telephone order to remove dobhoff tube. Reported this to [facility] RN in person and placed order.

## 2014-12-21 NOTE — Progress Notes (Signed)
Nutrition Follow-up    INTERVENTION:   Meals and Snacks: Cater to patient preferences Medical Food Supplement Therapy: recommend addition of Glucerna Shake po TID, each supplement provides 220 kcal and 10 grams of protein   NUTRITION DIAGNOSIS:   Inadequate oral intake related to acute illness as evidenced by NPO status.  GOAL:   Patient will meet greater than or equal to 90% of their needs  MONITOR:    (Energy Intake, Pulmonary, Digestive System, Anthropometrics, Electrolyte/Renal Profile, Glucose Profile)   ASSESSMENT:    Pt alert, trach collar as tolerated, pt wanting to eat, diet advanced per SLP this AM, dobhoff removed  Diet Order:  DIET DYS 2 Room service appropriate?: Yes; Fluid consistency:: Thin   Energy Intake: no po intake yet, previously tolerating TF via dobhoff tube  Electrolyte and Renal Profile:  Recent Labs Lab 12/19/14 0445 12/20/14 0351 12/21/14 0523  BUN 19 20 24*  CREATININE 0.97 0.78 0.75  NA 140 139 140  K 3.6 3.7 3.4*  MG 1.8 1.7 1.6*  PHOS 2.5 2.4* 2.6   Glucose Profile:  Recent Labs  12/20/14 2124 12/21/14 0732 12/21/14 1130  GLUCAP 112* 98 111*   Meds: ss novolog  Skin:   (stage II buttock)  Height:   Ht Readings from Last 1 Encounters:  12/13/14  (1.702 m)    Weight:   Wt Readings from Last 1 Encounters:  12/21/14 230 lb 2.6 oz (104.4 kg)   Filed Weights   12/19/14 0600 12/20/14 0504 12/21/14 0520  Weight: 226 lb 6.6 oz (102.7 kg) 228 lb 9.9 oz (103.7 kg) 230 lb 2.6 oz (104.4 kg)    BMI:  Body mass index is 36.04 kg/(m^2).  Estimated Nutritional Needs:   Kcal:  9604-5409 kcals (11-14 kcals/kg)   Protein:  122-153 g (2.0-2.5 g/kg) using IBW 61 kg  Fluid:  1525-1830 mL (25-30 ml/kg)   HIGH Care Level  Romelle Starcher MS, RD, LDN [phone] Pager

## 2014-12-21 NOTE — Progress Notes (Signed)
RASS 0. + F/C. Indicates that she wishes to eat by mouth. No distress on PS 10 cm H20  Filed Vitals:   12/21/14 1200 12/21/14 1300 12/21/14 1400 12/21/14 1437  BP: 119/70 132/75 139/75   Pulse:   25   Temp: 99.3 F (37.4 C) 99 F (37.2 C) 99 F (37.2 C)   TempSrc:      Resp: Height:      Weight:      SpO2:  100% 99% 99%   Tremulous  HEENT WNL Trach site clean Sternotomy scar noted No wheezes Reg, no M, prominent S2 Abd mildly distended, + tympani, NT, NABS Ext without edema, + chronic stasis changes No focal neuro deficits   BMP Latest Ref Rng 12/21/2014 12/20/2014 12/19/2014  Glucose 65 - 99 mg/dL 161(W) 960(A) 540(J)  BUN 6 - 20 mg/dL 81(X) 20 19  Creatinine 0.44 - 1.00 mg/dL 9.14 7.82 9.56  Sodium 135 - 145 mmol/L 140 139 140  Potassium 3.5 - 5.1 mmol/L 3.4(L) 3.7 3.6  Chloride 101 - 111 mmol/L 104 106 106  CO2 22 - 32 mmol/L Calcium 8.9 - 10.3 mg/dL 10.6(H) 10.5(H) 10.7(H)    CBC Latest Ref Rng 12/21/2014 12/20/2014 12/19/2014  WBC 3.6 - 11.0 K/uL 4.3 5.2 -  Hemoglobin 12.0 - 16.0 g/dL 2.1(H) 0.8(M) 5.7(Q)  Hematocrit 35.0 - 47.0 % 26.5(L) 29.2(L) -  Platelets 150 - 440 K/uL 93(L) 97(L) -    CXR: NNF   INDWELLING DEVICES:: Trach tube - chronic LUE PICC 9/11 >>   MICRO DATA: MRSA PCR 9/04 >> POS Urine 9/04 >> ESBL E coli Blood 9/04 >> NEG Resp 9/06 >> Citrobacter and pseudomonas  ANTIMICROBIALS:  Per ID service   IMPRESSION: Chronic trach dependence Acute on chronic respiratory failure ESBL UTI Tracheobronchitis vs PNA  PLAN/REC: Cont ATC as long as iterated during daytime Mandatory nocturnal ventilation for now SLP eval for oral diet recs Abx per ID - Meropenem through 9/23 Seeking return to SNF  Billy Fischer, MD PCCM service Mobile [phone] Pager [phone]

## 2014-12-21 NOTE — Progress Notes (Signed)
[facility] INFECTIOUS DISEASE PROGRESS NOTE Date of Admission:  12/13/2014     ID: Rachel Brady is a 69 y.o. female with ESBL  Active Problems:   Sepsis   Respiratory distress   Pressure ulcer   Subjective: More alert, on trach collar. No fevers, asking to eat  ROS  Eleven systems are reviewed and negative except per hpi  Medications:  Antibiotics Given (last 72 hours)    Date/Time Action Medication Dose Rate   12/18/14 1421 Given   meropenem (MERREM) 1 g in sodium chloride 0.9 % 100 mL IVPB 1 g 200 mL/hr   12/18/14 2145 Given   meropenem (MERREM) 1 g in sodium chloride 0.9 % 100 mL IVPB 1 g 200 mL/hr   12/19/14 0611 Given   meropenem (MERREM) 1 g in sodium chloride 0.9 % 100 mL IVPB 1 g 200 mL/hr   12/19/14 1350 Given   meropenem (MERREM) 1 g in sodium chloride 0.9 % 100 mL IVPB 1 g 200 mL/hr   12/19/14 2100 Given   meropenem (MERREM) 1 g in sodium chloride 0.9 % 100 mL IVPB 1 g 200 mL/hr   12/20/14 0556 Given   meropenem (MERREM) 1 g in sodium chloride 0.9 % 100 mL IVPB 1 g 200 mL/hr   12/20/14 1402 Given   meropenem (MERREM) 1 g in sodium chloride 0.9 % 100 mL IVPB 1 g 200 mL/hr   12/20/14 2133 Given   meropenem (MERREM) 1 g in sodium chloride 0.9 % 100 mL IVPB 1 g 200 mL/hr   12/21/14 0532 Given   meropenem (MERREM) 1 g in sodium chloride 0.9 % 100 mL IVPB 1 g 200 mL/hr   12/21/14 1302 Given   meropenem (MERREM) 1 g in sodium chloride 0.9 % 100 mL IVPB 1 g 200 mL/hr     . antiseptic oral rinse  7 mL Mouth Rinse QID  . apixaban  5 mg Oral BID  . chlorhexidine gluconate  15 mL Mouth Rinse BID  . escitalopram  5 mg Oral Daily  . feeding supplement (GLUCERNA SHAKE)  237 mL Oral TID WC  . insulin aspart  0-5 Units Subcutaneous QHS  . insulin aspart  0-9 Units Subcutaneous TID WC  . ipratropium-albuterol  3 mL Nebulization Q6H  . meropenem (MERREM) IV  1 g Intravenous 3 times per day  . pantoprazole sodium  40 mg Per Tube Daily  . QUEtiapine  50 mg Oral QHS  .  senna-docusate  1 tablet Oral BID    Objective: Vital signs in last 24 hours: Temp:  [98.2 F (36.8 C)-99.7 F (37.6 C)] 99 F (37.2 C) (09/12 1300) Pulse Rate:  [78-99] 91 (09/12 1000) Resp:  [13-25] 21 (09/12 1300) BP: (100-142)/(52-97) 132/75 mmHg (09/12 1300) SpO2:  [92 %-100 %] 100 % (09/12 1300) FiO2 (%):  [30 %-35 %] 35 % (09/12 1155) Weight:  [104.4 kg (230 lb 2.6 oz)] 104.4 kg (230 lb 2.6 oz) (09/12 0520) Constitutional: Trached, sedated  HENT: /AT, PERRLA, no scleral icterus Mouth/Throat: Oropharynx is clear and dry . No oropharyngeal exudate.  Cardiovascular: Normal rate, regular rhythm and normal heart sounds. Exam reveals no gallop and no friction rub.  Pulmonary/Chest: rhonchi bilateralluy Neck = supple, no nuchal rigidity Abdominal: Soft. Bowel sounds are normal. exhibits no distension. There is no tenderness.  Lymphadenopathy: no cervical adenopathy. No axillary adenopathy Neurological: alert and oriented to person, place, and time.  Skin: Skin is warm and dry. No rash noted. No [facility].  Psychiatric: a normal mood and affect. behavior is normal.  Foley cath  Lab Results  Recent Labs  12/20/14 0351 12/21/14 0523  WBC 5.2 4.3  HGB 9.1* 8.4*  HCT 29.2* 26.5*  NA 139 140  K 3.7 3.4*  CL 106 104  CO2 28 30  BUN 20 24*  CREATININE 0.78 0.75    Microbiology: Results for orders placed or performed during the hospital encounter of 12/13/14  Blood Culture (routine x 2)     Status: None   Collection Time: 12/13/14  9:25 AM  Result Value Ref Range Status   Specimen Description BLOOD LEFT ARM  Final   Special Requests BOTTLES DRAWN AEROBIC AND ANAEROBIC  Final   Culture  Setup Time   Final    GRAM POSITIVE COCCI IN CLUSTERS ANAEROBIC BOTTLE ONLY CRITICAL RESULT CALLED TO, READ BACK BY AND VERIFIED WITH: LESLIE LEWIS 12/14/2014 0630 [facility] CONFIRMED BY [facility]    Culture   Final    COAGULASE NEGATIVE STAPHYLOCOCCUS ANAEROBIC BOTTLE ONLY Results  consistent with contamination.    Report Status 12/18/2014 FINAL  Final  Blood Culture (routine x 2)     Status: None   Collection Time: 12/13/14  9:35 AM  Result Value Ref Range Status   Specimen Description BLOOD RIGHT FORARM  Final   Special Requests BOTTLES DRAWN AEROBIC AND ANAEROBIC  Final   Culture NO GROWTH 5 DAYS  Final   Report Status 12/18/2014 FINAL  Final  Urine culture     Status: None   Collection Time: 12/13/14  9:35 AM  Result Value Ref Range Status   Specimen Description URINE, RANDOM  Final   Special Requests NONE  Final   Culture   Final    >=100,000 COLONIES/mL ESCHERICHIA COLI ESBL-EXTENDED SPECTRUM BETA LACTAMASE-THE ORGANISM IS RESISTANT TO PENICILLINS, CEPHALOSPORINS AND AZTREONAM ACCORDING TO CLSI M100-S15 VOL.25 N01 JAN 2005. Results Called to: Derald Macleod AT 1119 12/15/14 CTJ    Report Status 12/15/2014 FINAL  Final   Organism ID, Bacteria ESCHERICHIA COLI  Final      Susceptibility   Escherichia coli - MIC*    AMPICILLIN >=32 RESISTANT Resistant     CEFTAZIDIME 16 RESISTANT Resistant     CEFAZOLIN >=64 RESISTANT Resistant     CEFTRIAXONE 32 RESISTANT Resistant     GENTAMICIN <=1 SENSITIVE Sensitive     IMIPENEM <=0.25 SENSITIVE Sensitive     TRIMETH/SULFA >=320 RESISTANT Resistant     Extended ESBL POSITIVE Resistant     CIPROFLOXACIN Value in next row Sensitive      SENSITIVE<=0.25    NITROFURANTOIN Value in next row Sensitive      SENSITIVE<=16    PIP/TAZO Value in next row Resistant      RESISTANT>=128    ERTAPENEM Value in next row Sensitive      SENSITIVE<=0.5    * >=100,000 COLONIES/mL ESCHERICHIA COLI  MRSA PCR Screening     Status: Abnormal   Collection Time: 12/13/14 12:20 PM  Result Value Ref Range Status   MRSA by PCR POSITIVE (A) NEGATIVE Final    Comment:        The GeneXpert MRSA Assay (FDA approved for NASAL specimens only), is one component of a comprehensive MRSA colonization surveillance program. It is  not intended to diagnose MRSA infection nor to guide or monitor treatment for MRSA infections. CRITICAL RESULT CALLED TO, READ BACK BY AND VERIFIED WITH: ADAM SCARBORO @1338  ON 12/13/14 BY HP   Culture, expectorated sputum-assessment  Status: None   Collection Time: 12/15/14 10:49 AM  Result Value Ref Range Status   Specimen Description ENDOTRACHEAL  Final   Special Requests NONE  Final   Sputum evaluation THIS SPECIMEN IS ACCEPTABLE FOR SPUTUM CULTURE  Final   Report Status 12/16/2014 FINAL  Final  Culture, respiratory (NON-Expectorated)     Status: None   Collection Time: 12/15/14 10:49 AM  Result Value Ref Range Status   Specimen Description ENDOTRACHEAL  Final   Special Requests NONE Reflexed from [id number]  Final   Gram Stain   Final    FEW WBC SEEN FEW GRAM NEGATIVE RODS FEW GRAM POSITIVE COCCI IN PAIRS FAIR SPECIMEN - 70-80% WBCS    Culture   Final    MODERATE GROWTH CITROBACTER SPECIES HEAVY GROWTH PSEUDOMONAS AERUGINOSA MULTI-DRUG RESISTANT ORGANISM CRITICAL RESULT CALLED TO, READ BACK BY AND VERIFIED WITH: [employer] BORBA 12/18/14 1042 JGF    Report Status 12/19/2014 FINAL  Final   Organism ID, Bacteria PSEUDOMONAS AERUGINOSA  Final   Organism ID, Bacteria CITROBACTER SPECIES  Final      Susceptibility   Pseudomonas aeruginosa - MIC*    CEFTAZIDIME >=64 RESISTANT Resistant     CIPROFLOXACIN >=4 RESISTANT Resistant     GENTAMICIN >=16 RESISTANT Resistant     IMIPENEM 1 SENSITIVE Sensitive     PIP/TAZO Value in next row Resistant      RESISTANT>=128    * HEAVY GROWTH PSEUDOMONAS AERUGINOSA   Citrobacter species - MIC*    PIP/TAZO Value in next row Resistant      RESISTANT>=128    CEFTAZIDIME Value in next row Sensitive      SENSITIVE<=4    CEFEPIME Value in next row Sensitive      SENSITIVE<=1    CEFAZOLIN Value in next row Resistant      RESISTANT>=64    CEFTRIAXONE Value in next row Sensitive      SENSITIVE8    CIPROFLOXACIN Value in next row Sensitive       SENSITIVE<=0.25    GENTAMICIN Value in next row Sensitive      SENSITIVE<=1    IMIPENEM Value in next row Sensitive      SENSITIVE<=0.25    TRIMETH/SULFA Value in next row Sensitive      SENSITIVE<=20    * MODERATE GROWTH CITROBACTER SPECIES    Studies/Results: Dg Chest Port 1 View  12/20/2014   CLINICAL DATA:  69 year old female with a history of respiratory failure  EXAM: PORTABLE CHEST - 1 VIEW  COMPARISON:  12/19/2014, 12/15/2014, 12/14/2014  FINDINGS: Cardiomediastinal silhouette unchanged.  Surgical changes of median sternotomy and CABG.  Unchanged tracheostomy tube.  Persisting opacity at the left base with obscuration left hemidiaphragm, left costophrenic angle, and left heart border.  Similar opacity at the right base partially obscuring the right hemidiaphragm and right costophrenic angle.  Unchanged left-sided PICC.  IMPRESSION: Similar appearance the chest x-ray with persisting dense left basilar opacity, potentially a combination of lung volume loss/ atelectasis, consolidation/infection, and/or pleural effusion.  Similar appearance of the right basilar opacity, likely combination of pleural effusion atelectasis.  Similar appearance of cardiomegaly. Pericardial effusion not excluded. If there is concern for cardiac compromise, correlation with ECHO may be useful.  Unchanged left upper extremity PICC and tracheostomy tube.  Signed,  Yvone Neu. Loreta Ave, DO  [facility]  [facility]   Electronically Signed   By: Gilmer Mor D.O.   On: 12/20/2014 09:24   Dg Chest [facility] [address]  12/19/2014   CLINICAL DATA:  PICC line placement  EXAM: PORTABLE CHEST - 1 VIEW  COMPARISON:  12/15/2014  FINDINGS: Postoperative changes in the mediastinum. Shallow inspiration. Prominent large moped cardiac silhouette which could be due to cardiac enlargement and/ or pericardial effusion. Small bilateral pleural effusions with basilar atelectasis or infiltration. Effusions  are increasing since previous study. Tracheostomy. Enteric tube with tip in the left upper quadrant, likely in the stomach. Left PICC catheter with tip over the mid SVC region. No pneumothorax.  IMPRESSION: Appliances appear in satisfactory location. Enlargement of the cardiac silhouette. Increasing bilateral pleural effusions and basilar atelectasis.   Electronically Signed   By: Burman Nieves M.D.   On: 12/19/2014 21:26    Assessment/Plan: Rachel Brady is a 69 y.o. female admitted with sepsis from ESBL E colu UTI, PNA and respiratory arrest. Bcx 1/2 with CNS likely contaminant. Fever curve improving, wbc nml.  Sutum with MDR Psuedomonas and citrobacter. Clinically much improved. On trach collar and less sputum per RN  Recommendations Continue meropenem - will need picc and 14 day course from initiation of appropriate coverage for the pseudomonas (cannot use ertapenem as poor Pseudomonas activity) Stop date 01/01/15 WIll need continue aggressive pulmonary toilet as well    I will sign off. No need for her to fu in ID clinic unless develops recurrent infection   Yaslin Kirtley   12/21/2014, 2:16 PM

## 2014-12-21 NOTE — Care Management Note (Signed)
Case Management Note  Patient Details  Name: Rachel Brady MRN: [id number] Date of Birth: [birth year]  Subjective/Objective:   Trach collar trials now and tolerating well at this point. CSW following                 Action/Plan: [facility] when medically stable.   Expected Discharge Date:                  Expected Discharge Plan:  Skilled Nursing Facility  In-House Referral:  Clinical Social Work  Discharge planning Services  CM Consult  Post Acute Care Choice:  Long Term Acute Care (LTAC) Choice offered to:  Adult Children  DME Arranged:    DME Agency:     HH Arranged:    HH Agency:     Status of Service:  In process, will continue to follow  Medicare Important Message Given:  Yes-second notification given Date Medicare IM Given:    Medicare IM give by:    Date Additional Medicare IM Given:    Additional Medicare Important Message give by:     If discussed at Long Length of Stay Meetings, dates discussed:    Additional Comments:  Marily Memos, RN 12/21/2014, 11:25 AM

## 2014-12-21 NOTE — Progress Notes (Signed)
Resting well since dinner. Tolerated trach collar since this morning with no respiratory distress. Tolerating diet. Pain controlled with PO pain med as needed. Afib per cardiac monitor. No visitors during shift.

## 2014-12-21 NOTE — Progress Notes (Signed)
Speech Language Pathology Treatment: Dysphagia  Patient Details Name: Rachel Brady MRN: [id number] DOB: 22-Aug-1945 Today's Date: 12/21/2014 Time: 4098-1191 SLP Time Calculation (min) (ACUTE ONLY): 60 min  Assessment / Plan / Recommendation Clinical Impression  Pt is once again on trach collar O2 support weaned from full vent support. She is able to tolerate wearing PMV for verbal communication w/ others w/out desat or discomfort. After wearing the PMV for 1+ hours, pt was then seen for trials of po's and toleration of such. Pt was able to tolerate the exertion of task of eating/drinking and demo. No overt s/s of aspiration w/ trials of thin liquids and broken down, soft solids. Clear vocal quality noted b/t trials; no coughing or throat clearing noted; no decline in O2 sats or RR/HR - O2 sats remained in the mid-upper 90s. Pt appeared to have min. Increased exertion w/ the task of taking po's but was given rest breaks to avoid WOB. She appeared to manage po's adequately following general aspiration precautions; must wear the PMV w/ all oral intake. Rec. A dysphagia level 2 diet to be reinitiated w/ strict aspiration precautions; meds in puree; must wear the PMV w/ oral intake. ST will f/u w/ toleration of diet next 1-2 days. MD/NSG updated.     HPI Other Pertinent Information: Pt is a 69 y.o. female with a known history of tracheostomy(also current), OSA, MDD, GERD, atrial fibrillation and type 2 diabetes who presents from nursing home with altered mental status and apnea. Patient was apparently seen approximately 1:00 this morning in her usual state of health. This morning when the staff tried to wake her she was unarousable and very lethargic. EMS was then called by the staff. On transportation to the ER it was noted the patient had episodes of apnea. Patient was bagged and started breathing spontaneously. She is currently intubated. She was also found to be hypotensive and hypothermic. Sepsis  protocol was ordered. Patient was started on Zosyn and vancomycin. Patient currently has a bear hugger in place. Patient had a urine analysis which shows a urinary tract infection. Pt typically has a cuffless trach in place and uses a speaking valve per her report. She eats a regular diet per report. At admission, pt was placed on vent support briefly overnight then again a few days later over this past weekend. She also received a dobhoff for NG TFs. She is now off vent support and back on trach collar O2 support and wanting to eat; dobhoff in place during trials.    Pertinent Vitals Pain Assessment: No/denies pain  SLP Plan       Recommendations Diet recommendations: Dysphagia 1 (puree);Dysphagia 2 (fine chop);Thin liquid Liquids provided via: Cup;Straw Medication Administration: Whole meds with puree (crushed if nec. ) Supervision: Staff to assist with self feeding;Full supervision/cueing for compensatory strategies Compensations: Slow rate;Small sips/bites;Minimize environmental distractions Postural Changes and/or Swallow Maneuvers: Seated upright 90 degrees MUST WEAR THE PMV W/ ALL ORAL INTAKE - MEALS/MEDS/SIPS      Patient may use Passy-Muir Speech Valve: During PO intake/meals;During all waking hours (remove during sleep) PMSV Supervision: Full       General recommendations:  (TBD to monitor use and wear) Oral Care Recommendations: Oral care BID;Staff/trained caregiver to provide oral care Follow up Recommendations: Skilled Nursing facility (TBD)    GO     Darilyn Storbeck 12/21/2014, 2:55 PM

## 2014-12-21 NOTE — Progress Notes (Deleted)
At 1708 cardiac monitor alarmed and Asystole was noted on monitor.  This RN, Florentina Addison, RN charge nurse and Dr. Orvan Falconer entered room where sitter was already at bedside. Patient on bipap. RN palpated for carotid pulse and none was felt.  No rise and fall of chest noted. Dr. Orvan Falconer auscultated for heart tones and pronounced patients time of death at 1708. Dr. Orvan Falconer and this RN went out to waiting area and Dr. Orvan Falconer informed patient's daughters of patient's passing. Family came into room and at bedside with chaplain at this time. This RN notified Annice Pih, nursing supervisor and Dr. Orvan Falconer notified Dr. Cherlynn Kaiser of patient's passing.

## 2014-12-21 NOTE — Progress Notes (Signed)
ANTIBIOTIC CONSULT NOTE - FOLLOW UP   Pharmacy Consult for Meropenm Indication: ESBL UTI/Pseudomonas PNA  Allergies  Allergen Reactions  . Codeine     Has tolerated oxycodone in the past.    Patient Measurements: Height:  (170.2 cm) Weight: 230 lb 2.6 oz (104.4 kg) IBW/kg (Calculated) : 61.6   Vital Signs: Temp: 99.3 F (37.4 C) (09/12 1100) BP: 142/78 mmHg (09/12 1100) Pulse Rate: 91 (09/12 1000) Intake/Output from previous day: 09/11 0701 - 09/12 0700 In: 2248.5 [NG/GT:1948.5; IV Piggyback:300] Out: 950 [Urine:950] Intake/Output from this shift: Total I/O In: 390 [NG/GT:340; IV Piggyback:50] Out: -   Labs:  Recent Labs  12/19/14 0445 12/20/14 0351 12/21/14 0523  WBC  --  5.2 4.3  HGB 9.2* 9.1* 8.4*  PLT  --  97* 93*  CREATININE 0.97 0.78 0.75   Estimated Creatinine Clearance: 83.6 mL/min (by C-G formula based on Cr of 0.75). No results for input(s): VANCOTROUGH, VANCOPEAK, VANCORANDOM, GENTTROUGH, GENTPEAK, GENTRANDOM, TOBRATROUGH, TOBRAPEAK, TOBRARND, AMIKACINPEAK, AMIKACINTROU, AMIKACIN in the last 72 hours.   Microbiology: Recent Results (from the past 720 hour(s))  Blood Culture (routine x 2)     Status: None   Collection Time: 12/13/14  9:25 AM  Result Value Ref Range Status   Specimen Description BLOOD LEFT ARM  Final   Special Requests BOTTLES DRAWN AEROBIC AND ANAEROBIC  Final   Culture  Setup Time   Final    GRAM POSITIVE COCCI IN CLUSTERS ANAEROBIC BOTTLE ONLY CRITICAL RESULT CALLED TO, READ BACK BY AND VERIFIED WITH: LESLIE LEWIS 12/14/2014 0630 [facility] CONFIRMED BY [facility]    Culture   Final    COAGULASE NEGATIVE STAPHYLOCOCCUS ANAEROBIC BOTTLE ONLY Results consistent with contamination.    Report Status 12/18/2014 FINAL  Final  Blood Culture (routine x 2)     Status: None   Collection Time: 12/13/14  9:35 AM  Result Value Ref Range Status   Specimen Description BLOOD RIGHT FORARM  Final   Special Requests BOTTLES DRAWN AEROBIC AND  ANAEROBIC  Final   Culture NO GROWTH 5 DAYS  Final   Report Status 12/18/2014 FINAL  Final  Urine culture     Status: None   Collection Time: 12/13/14  9:35 AM  Result Value Ref Range Status   Specimen Description URINE, RANDOM  Final   Special Requests NONE  Final   Culture   Final    >=100,000 COLONIES/mL ESCHERICHIA COLI ESBL-EXTENDED SPECTRUM BETA LACTAMASE-THE ORGANISM IS RESISTANT TO PENICILLINS, CEPHALOSPORINS AND AZTREONAM ACCORDING TO CLSI M100-S15 VOL.25 N01 JAN 2005. Results Called to: Derald Macleod AT 1119 12/15/14 CTJ    Report Status 12/15/2014 FINAL  Final   Organism ID, Bacteria ESCHERICHIA COLI  Final      Susceptibility   Escherichia coli - MIC*    AMPICILLIN >=32 RESISTANT Resistant     CEFTAZIDIME 16 RESISTANT Resistant     CEFAZOLIN >=64 RESISTANT Resistant     CEFTRIAXONE 32 RESISTANT Resistant     GENTAMICIN <=1 SENSITIVE Sensitive     IMIPENEM <=0.25 SENSITIVE Sensitive     TRIMETH/SULFA >=320 RESISTANT Resistant     Extended ESBL POSITIVE Resistant     CIPROFLOXACIN Value in next row Sensitive      SENSITIVE<=0.25    NITROFURANTOIN Value in next row Sensitive      SENSITIVE<=16    PIP/TAZO Value in next row Resistant      RESISTANT>=128    ERTAPENEM Value in next row Sensitive  SENSITIVE<=0.5    * >=100,000 COLONIES/mL ESCHERICHIA COLI  MRSA PCR Screening     Status: Abnormal   Collection Time: 12/13/14 12:20 PM  Result Value Ref Range Status   MRSA by PCR POSITIVE (A) NEGATIVE Final    Comment:        The GeneXpert MRSA Assay (FDA approved for NASAL specimens only), is one component of a comprehensive MRSA colonization surveillance program. It is not intended to diagnose MRSA infection nor to guide or monitor treatment for MRSA infections. CRITICAL RESULT CALLED TO, READ BACK BY AND VERIFIED WITH: ADAM SCARBORO @1338  ON 12/13/14 BY HP   Culture, expectorated sputum-assessment     Status: None   Collection Time: 12/15/14 10:49  AM  Result Value Ref Range Status   Specimen Description ENDOTRACHEAL  Final   Special Requests NONE  Final   Sputum evaluation THIS SPECIMEN IS ACCEPTABLE FOR SPUTUM CULTURE  Final   Report Status 12/16/2014 FINAL  Final  Culture, respiratory (NON-Expectorated)     Status: None   Collection Time: 12/15/14 10:49 AM  Result Value Ref Range Status   Specimen Description ENDOTRACHEAL  Final   Special Requests NONE Reflexed from [id number]  Final   Gram Stain   Final    FEW WBC SEEN FEW GRAM NEGATIVE RODS FEW GRAM POSITIVE COCCI IN PAIRS FAIR SPECIMEN - 70-80% WBCS    Culture   Final    MODERATE GROWTH CITROBACTER SPECIES HEAVY GROWTH PSEUDOMONAS AERUGINOSA MULTI-DRUG RESISTANT ORGANISM CRITICAL RESULT CALLED TO, READ BACK BY AND VERIFIED WITH: [facility] BORBA 12/18/14 1042 JGF    Report Status 12/19/2014 FINAL  Final   Organism ID, Bacteria PSEUDOMONAS AERUGINOSA  Final   Organism ID, Bacteria CITROBACTER SPECIES  Final      Susceptibility   Pseudomonas aeruginosa - MIC*    CEFTAZIDIME >=64 RESISTANT Resistant     CIPROFLOXACIN >=4 RESISTANT Resistant     GENTAMICIN >=16 RESISTANT Resistant     IMIPENEM 1 SENSITIVE Sensitive     PIP/TAZO Value in next row Resistant      RESISTANT>=128    * HEAVY GROWTH PSEUDOMONAS AERUGINOSA   Citrobacter species - MIC*    PIP/TAZO Value in next row Resistant      RESISTANT>=128    CEFTAZIDIME Value in next row Sensitive      SENSITIVE<=4    CEFEPIME Value in next row Sensitive      SENSITIVE<=1    CEFAZOLIN Value in next row Resistant      RESISTANT>=64    CEFTRIAXONE Value in next row Sensitive      SENSITIVE8    CIPROFLOXACIN Value in next row Sensitive      SENSITIVE<=0.25    GENTAMICIN Value in next row Sensitive      SENSITIVE<=1    IMIPENEM Value in next row Sensitive      SENSITIVE<=0.25    TRIMETH/SULFA Value in next row Sensitive      SENSITIVE<=20    * MODERATE GROWTH CITROBACTER SPECIES    Medical History: Past Medical  History  Diagnosis Date  . Tracheostomy in place   . Diabetes   . CKD (chronic kidney disease)   . OSA (obstructive sleep apnea)   . HTN (hypertension)   . MDD (major depressive disorder)     Medications:  Scheduled:  . antiseptic oral rinse  7 mL Mouth Rinse QID  . apixaban  5 mg Oral BID  . chlorhexidine gluconate  15 mL Mouth Rinse BID  . escitalopram  5 mg Oral Daily  . feeding supplement (PRO-STAT SUGAR FREE 64)  30 mL Per Tube Daily  . feeding supplement (VITAL HIGH PROTEIN)  1,000 mL Per Tube Q24H  . free water  100 mL Per Tube 6 times per day  . insulin aspart  0-5 Units Subcutaneous QHS  . insulin aspart  0-9 Units Subcutaneous TID WC  . ipratropium-albuterol  3 mL Nebulization Q6H  . meropenem (MERREM) IV  1 g Intravenous 3 times per day  . pantoprazole sodium  40 mg Per Tube Daily  . QUEtiapine  50 mg Oral QHS  . senna-docusate  1 tablet Oral BID   Infusions:   PRN: acetaminophen **OR** acetaminophen, ALPRAZolam, alum & mag hydroxide-simeth, hydrALAZINE, HYDROcodone-acetaminophen, Influenza vac split quadrivalent PF, metoprolol, morphine injection, ondansetron **OR** ondansetron (ZOFRAN) IV, pneumococcal 23 valent vaccine  Assessment: 69 y/o F with acute respiratory failure on ciprofloxacin for ESBL E coli UTI. Tracheal aspirate culture growing Pseudomonas resistant to all but imipenem. Patient is currently on day 4 of meropenem.   Plan:  Will continue Merrem 1 g IV q8 hours. Patient needs a total of 14 days of therapy.   Rachel Brady D 12/21/2014,12:06 PM

## 2014-12-22 ENCOUNTER — Inpatient Hospital Stay: Payer: Medicare Other

## 2014-12-22 DIAGNOSIS — J810 Acute pulmonary edema: Secondary | ICD-10-CM

## 2014-12-22 LAB — CBC
HEMATOCRIT: 27.6 % — AB (ref 35.0–47.0)
Hemoglobin: 8.5 g/dL — ABNORMAL LOW (ref 12.0–16.0)
MCH: 26.8 pg (ref 26.0–34.0)
MCHC: 31 g/dL — AB (ref 32.0–36.0)
MCV: 86.4 fL (ref 80.0–100.0)
Platelets: 121 10*3/uL — ABNORMAL LOW (ref 150–440)
RBC: 3.19 MIL/uL — ABNORMAL LOW (ref 3.80–5.20)
RDW: 21.2 % — AB (ref 11.5–14.5)
WBC: 4.3 10*3/uL (ref 3.6–11.0)

## 2014-12-22 LAB — GLUCOSE, CAPILLARY
GLUCOSE-CAPILLARY: 107 mg/dL — AB (ref 65–99)
GLUCOSE-CAPILLARY: 122 mg/dL — AB (ref 65–99)
Glucose-Capillary: 161 mg/dL — ABNORMAL HIGH (ref 65–99)
Glucose-Capillary: 145 mg/dL — ABNORMAL HIGH (ref 65–99)

## 2014-12-22 LAB — BASIC METABOLIC PANEL
Anion gap: 4 — ABNORMAL LOW (ref 5–15)
BUN: 25 mg/dL — ABNORMAL HIGH (ref 6–20)
CALCIUM: 11 mg/dL — AB (ref 8.9–10.3)
CO2: 31 mmol/L (ref 22–32)
CREATININE: 0.81 mg/dL (ref 0.44–1.00)
Chloride: 105 mmol/L (ref 101–111)
GFR calc non Af Amer: >60 mL/min (ref >60)
Glucose, Bld: 117 mg/dL — ABNORMAL HIGH (ref 65–99)
Potassium: 4.5 mmol/L (ref 3.5–5.1)
SODIUM: 140 mmol/L (ref 135–145)

## 2014-12-22 LAB — PHOSPHORUS: PHOSPHORUS: 3 mg/dL (ref 2.5–4.6)

## 2014-12-22 LAB — MAGNESIUM: Magnesium: 1.9 mg/dL (ref 1.7–2.4)

## 2014-12-22 MED ORDER — FUROSEMIDE 10 MG/ML IJ SOLN
40.0000 mg | Freq: Once | INTRAMUSCULAR | Status: AC
  Administered 2014-12-22: 40 mg via INTRAVENOUS
  Filled 2014-12-22: qty 4

## 2014-12-22 MED ORDER — ATORVASTATIN CALCIUM 20 MG PO TABS
80.0000 mg | ORAL_TABLET | Freq: Every day | ORAL | Status: DC
  Administered 2014-12-22 – 2015-01-01 (×7): 80 mg via ORAL
  Filled 2014-12-22 (×7): qty 4

## 2014-12-22 NOTE — Clinical Social Work Note (Signed)
Pt is not medically stable for d/c.  CSW will continue to follow and assist with d/c planning needs.  [place], [place] [phone]

## 2014-12-22 NOTE — Progress Notes (Signed)
[facility] - [place] at [facility]   PATIENT NAME: Rachel Brady    MR#:  [id number]  DATE OF BIRTH:  December 30, 1945  SUBJECTIVE:  CHIEF COMPLAINT:   Chief Complaint  Patient presents with  . Respiratory Arrest  Patient is  awake and alert. On trach collar. Denies any chest tightness or shortness of breath  REVIEW OF SYSTEMS:  Review of Systems  Constitutional: Negative for fever, chills and diaphoresis.  HENT: Negative for ear pain and tinnitus.   Eyes: Negative for blurred vision and pain.  Respiratory: Negative for cough, hemoptysis and wheezing.   Cardiovascular: Negative for chest pain and palpitations.  Gastrointestinal: Negative for heartburn, vomiting and abdominal pain.  Genitourinary: Negative for dysuria and urgency.  Musculoskeletal: Negative for myalgias, joint pain and neck pain.  Skin: Negative for itching and rash.  Neurological: Negative for dizziness and tremors.  Psychiatric/Behavioral: The patient is not nervous/anxious.    DRUG ALLERGIES:   Allergies  Allergen Reactions  . Codeine     Has tolerated oxycodone in the past.   VITALS:  Blood pressure 139/73, pulse 92, temperature 97.9 F (36.6 C), temperature source Core (Comment), resp. rate 24, height  (1.702 m), weight 105.7 kg (233 lb 0.4 oz), SpO2 95 %. PHYSICAL EXAMINATION:  Physical Exam  Constitutional: She is oriented to person, place, and time and well-developed, well-nourished, and in no distress.  HENT:  Head: Normocephalic and atraumatic.  Trach in place  Eyes: Conjunctivae and EOM are normal. Pupils are equal, round, and reactive to light.  Neck: Normal range of motion. Neck supple. No tracheal deviation present. No thyromegaly present.  Cardiovascular: Normal rate, regular rhythm and normal heart sounds.   Pulmonary/Chest: Effort normal and breath sounds normal. No respiratory distress. She has no wheezes. She exhibits no tenderness.  Abdominal: Soft. Bowel  sounds are normal. She exhibits no distension. There is no tenderness.  Musculoskeletal: Normal range of motion.  Neurological: She is alert and oriented to person, place, and time. No cranial nerve deficit.  Skin: Skin is warm and dry. No rash noted.  Psychiatric: Mood and affect normal.   LABORATORY PANEL:   CBC  Recent Labs Lab 12/22/14 0439  WBC 4.3  HGB 8.5*  HCT 27.6*  PLT 121*   ------------------------------------------------------------------------------------------------------------------ Chemistries   Recent Labs Lab 12/22/14 0439  NA 140  K 4.5  CL 105  CO2 31  GLUCOSE 117*  BUN 25*  CREATININE 0.81  CALCIUM 11.0*  MG 1.9   RADIOLOGY:  Dg Chest Port 1 View  12/22/2014   CLINICAL DATA:  Tracheostomy.  Respiratory failure .  EXAM: PORTABLE CHEST - 1 VIEW  COMPARISON:  12/20/2014.  FINDINGS: Interim removal of feeding tube. Tracheostomy tube and left PICC line stable position. Prior CABG. Cardiac valve replacement. Stable cardiomegaly. Mild pulmonary vascular congestion. Persistent bibasilar pulmonary infiltrates/ edema with bilateral pleural effusions. No significant interim improvement. No pneumothorax. Old left rib fractures.  IMPRESSION: 1. Interim removal of feeding tube. Tracheostomy tube, left PICC line and stable position. 2. Prior CABG and cardiac valve replacement. Persistent severe cardiomegaly. Mild pulmonary venous congestion. 3. Dense unchanged bibasilar pulmonary infiltrates and/or edema with bilateral pleural effusions.   Electronically Signed   By: Maisie Fus  Register   On: 12/22/2014 08:06   ASSESSMENT AND PLAN:  This is a 69 year old female with a history of tracheostomy, atrial fibrillation, diabetes who presents with sepsis.  1. Sepsis with hypotension: secondary to urinary tract infection with ESBL ecoli Hypotension  resolved  Appreciate ID input. Abx changed to Meropenem day #5/14 Continue meropenem for a total of 14 days until 01/01/2015 as per  ID recommendations. Status post PICC line 9/10  2. ESBL E.coli Urinary tract infection: changed to meropenem  Appreciate infectious disease input.    3. Healthcare acquired pneumonia:  Continue meropenem as trach suction sputum growing Pseudomonas & Citrobacter Chest x-ray with a dense unchanged bibasilar pulmonary infiltrates and edema with effusions  4. Acute respiratory failure: on TCT ,Cont as long as tolerated during daytime per CCT Appreciate critical care management recommendations  5. Atrial fibrillation: Her heart rate is well controlled at this time  6. Diabetes: Discontinued tube feeds   By mouth feeds today as tolerated, sliding scale insulin   7. Depression/anxiety: Anxiolytics as needed basis  8. Fungal skin infection: continue nystatin.  9. Nutrition- discontinued tube feeds. Tolerating by mouth feeds and medications as well   10 . Deconditioning with PT as tolerated  Disposition-[facility] /snf. We will follow up with case management  Anticipating discharge in 1-2 days   All the records are reviewed and case discussed with Care Management/Social Worker. Plan of care discussed with the patient, RN and critical care team   CODE STATUS: Full code  TOTAL TIME (CRITICAL CARE) TAKING CARE OF THIS PATIENT: 35 minutes.   More than 50% of the time was spent in counseling/coordination of care: YES  Did peer-peer Review with [employer] but they declined - they recommend appeal if CM/SW would like.  Ramonita Lab M.D on 12/22/2014 at 12:48 PM  Between 7am to 6pm - Pager - [phone]  After 6pm go to [email or web address] - password [email or web address] [facility]  [facility] [facility]  Office  [phone]  CC: Primary care physician; Dorothey Baseman, MD

## 2014-12-22 NOTE — Care Management Note (Signed)
Case Management Note  Patient Details  Name: Rachel Brady MRN: [id number] Date of Birth: 01-Jul-1945  Subjective/Objective:   Case discussed in rounds. Patient respiratory status labile today. Dr. Bard Herbert would like patient to have one more night on the vent and then 24 hours on trach collar only. Tentative discharge on Thursday to [facility].                  Action/Plan: SNF when medically stable.  Expected Discharge Date:                  Expected Discharge Plan:  Skilled Nursing Facility  In-House Referral:  Clinical Social Work  Discharge planning Services  CM Consult  Post Acute Care Choice:  Long Term Acute Care (LTAC) Choice offered to:  Adult Children  DME Arranged:    DME Agency:     HH Arranged:    HH Agency:     Status of Service:  In process, will continue to follow  Medicare Important Message Given:  Yes-third notification given Date Medicare IM Given:    Medicare IM give by:    Date Additional Medicare IM Given:    Additional Medicare Important Message give by:     If discussed at Long Length of Stay Meetings, dates discussed:    Additional Comments:  Marily Memos, RN 12/22/2014, 10:48 AM

## 2014-12-22 NOTE — Progress Notes (Signed)
ANTIBIOTIC CONSULT NOTE - FOLLOW UP   Pharmacy Consult for Meropenm Indication: ESBL UTI/Pseudomonas PNA  Allergies  Allergen Reactions  . Codeine     Has tolerated oxycodone in the past.    Patient Measurements: Height:  (170.2 cm) Weight: 233 lb 0.4 oz (105.7 kg) IBW/kg (Calculated) : 61.6   Vital Signs: Temp: 97.5 F (36.4 C) (09/13 1100) BP: 134/77 mmHg (09/13 1100) Pulse Rate: 93 (09/13 1100) Intake/Output from previous day: 09/12 0701 - 09/13 0700 In: 1410 [P.O.:720; NG/GT:340; IV Piggyback:350] Out: 935 [Urine:935] Intake/Output from this shift: Total I/O In: 360 [P.O.:360] Out: 175 [Urine:175]  Labs:  Recent Labs  12/20/14 0351 12/21/14 0523 12/22/14 0439  WBC 5.2 4.3 4.3  HGB 9.1* 8.4* 8.5*  PLT 97* 93* 121*  CREATININE 0.78 0.75 0.81   Estimated Creatinine Clearance: 83.1 mL/min (by C-G formula based on Cr of 0.81). No results for input(s): VANCOTROUGH, VANCOPEAK, VANCORANDOM, GENTTROUGH, GENTPEAK, GENTRANDOM, TOBRATROUGH, TOBRAPEAK, TOBRARND, AMIKACINPEAK, AMIKACINTROU, AMIKACIN in the last 72 hours.   Microbiology: Recent Results (from the past 720 hour(s))  Blood Culture (routine x 2)     Status: None   Collection Time: 12/13/14  9:25 AM  Result Value Ref Range Status   Specimen Description BLOOD LEFT ARM  Final   Special Requests BOTTLES DRAWN AEROBIC AND ANAEROBIC  Final   Culture  Setup Time   Final    GRAM POSITIVE COCCI IN CLUSTERS ANAEROBIC BOTTLE ONLY CRITICAL RESULT CALLED TO, READ BACK BY AND VERIFIED WITH: LESLIE LEWIS 12/14/2014 0630 [facility] CONFIRMED BY [facility]    Culture   Final    COAGULASE NEGATIVE STAPHYLOCOCCUS ANAEROBIC BOTTLE ONLY Results consistent with contamination.    Report Status 12/18/2014 FINAL  Final  Blood Culture (routine x 2)     Status: None   Collection Time: 12/13/14  9:35 AM  Result Value Ref Range Status   Specimen Description BLOOD RIGHT FORARM  Final   Special Requests BOTTLES DRAWN AEROBIC AND  ANAEROBIC  Final   Culture NO GROWTH 5 DAYS  Final   Report Status 12/18/2014 FINAL  Final  Urine culture     Status: None   Collection Time: 12/13/14  9:35 AM  Result Value Ref Range Status   Specimen Description URINE, RANDOM  Final   Special Requests NONE  Final   Culture   Final    >=100,000 COLONIES/mL ESCHERICHIA COLI ESBL-EXTENDED SPECTRUM BETA LACTAMASE-THE ORGANISM IS RESISTANT TO PENICILLINS, CEPHALOSPORINS AND AZTREONAM ACCORDING TO CLSI M100-S15 VOL.25 N01 JAN 2005. Results Called to: Derald Macleod AT 1119 12/15/14 CTJ    Report Status 12/15/2014 FINAL  Final   Organism ID, Bacteria ESCHERICHIA COLI  Final      Susceptibility   Escherichia coli - MIC*    AMPICILLIN >=32 RESISTANT Resistant     CEFTAZIDIME 16 RESISTANT Resistant     CEFAZOLIN >=64 RESISTANT Resistant     CEFTRIAXONE 32 RESISTANT Resistant     GENTAMICIN <=1 SENSITIVE Sensitive     IMIPENEM <=0.25 SENSITIVE Sensitive     TRIMETH/SULFA >=320 RESISTANT Resistant     Extended ESBL POSITIVE Resistant     CIPROFLOXACIN Value in next row Sensitive      SENSITIVE<=0.25    NITROFURANTOIN Value in next row Sensitive      SENSITIVE<=16    PIP/TAZO Value in next row Resistant      RESISTANT>=128    ERTAPENEM Value in next row Sensitive      SENSITIVE<=0.5    * >=  100,000 COLONIES/mL ESCHERICHIA COLI  MRSA PCR Screening     Status: Abnormal   Collection Time: 12/13/14 12:20 PM  Result Value Ref Range Status   MRSA by PCR POSITIVE (A) NEGATIVE Final    Comment:        The GeneXpert MRSA Assay (FDA approved for NASAL specimens only), is one component of a comprehensive MRSA colonization surveillance program. It is not intended to diagnose MRSA infection nor to guide or monitor treatment for MRSA infections. CRITICAL RESULT CALLED TO, READ BACK BY AND VERIFIED WITH: ADAM SCARBORO @1338  ON 12/13/14 BY HP   Culture, expectorated sputum-assessment     Status: None   Collection Time: 12/15/14 10:49  AM  Result Value Ref Range Status   Specimen Description ENDOTRACHEAL  Final   Special Requests NONE  Final   Sputum evaluation THIS SPECIMEN IS ACCEPTABLE FOR SPUTUM CULTURE  Final   Report Status 12/16/2014 FINAL  Final  Culture, respiratory (NON-Expectorated)     Status: None   Collection Time: 12/15/14 10:49 AM  Result Value Ref Range Status   Specimen Description ENDOTRACHEAL  Final   Special Requests NONE Reflexed from [id number]  Final   Gram Stain   Final    FEW WBC SEEN FEW GRAM NEGATIVE RODS FEW GRAM POSITIVE COCCI IN PAIRS FAIR SPECIMEN - 70-80% WBCS    Culture   Final    MODERATE GROWTH CITROBACTER SPECIES HEAVY GROWTH PSEUDOMONAS AERUGINOSA MULTI-DRUG RESISTANT ORGANISM CRITICAL RESULT CALLED TO, READ BACK BY AND VERIFIED WITH: [facility] BORBA 12/18/14 1042 JGF    Report Status 12/19/2014 FINAL  Final   Organism ID, Bacteria PSEUDOMONAS AERUGINOSA  Final   Organism ID, Bacteria CITROBACTER SPECIES  Final      Susceptibility   Pseudomonas aeruginosa - MIC*    CEFTAZIDIME >=64 RESISTANT Resistant     CIPROFLOXACIN >=4 RESISTANT Resistant     GENTAMICIN >=16 RESISTANT Resistant     IMIPENEM 1 SENSITIVE Sensitive     PIP/TAZO Value in next row Resistant      RESISTANT>=128    * HEAVY GROWTH PSEUDOMONAS AERUGINOSA   Citrobacter species - MIC*    PIP/TAZO Value in next row Resistant      RESISTANT>=128    CEFTAZIDIME Value in next row Sensitive      SENSITIVE<=4    CEFEPIME Value in next row Sensitive      SENSITIVE<=1    CEFAZOLIN Value in next row Resistant      RESISTANT>=64    CEFTRIAXONE Value in next row Sensitive      SENSITIVE8    CIPROFLOXACIN Value in next row Sensitive      SENSITIVE<=0.25    GENTAMICIN Value in next row Sensitive      SENSITIVE<=1    IMIPENEM Value in next row Sensitive      SENSITIVE<=0.25    TRIMETH/SULFA Value in next row Sensitive      SENSITIVE<=20    * MODERATE GROWTH CITROBACTER SPECIES    Medical History: Past Medical  History  Diagnosis Date  . Tracheostomy in place   . Diabetes   . CKD (chronic kidney disease)   . OSA (obstructive sleep apnea)   . HTN (hypertension)   . MDD (major depressive disorder)     Medications:  Scheduled:  . antiseptic oral rinse  7 mL Mouth Rinse QID  . apixaban  5 mg Oral BID  . chlorhexidine gluconate  15 mL Mouth Rinse BID  . escitalopram  5 mg Oral Daily  .  feeding supplement (GLUCERNA SHAKE)  237 mL Oral TID WC  . insulin aspart  0-5 Units Subcutaneous QHS  . insulin aspart  0-9 Units Subcutaneous TID WC  . ipratropium-albuterol  3 mL Nebulization Q6H  . meropenem (MERREM) IV  1 g Intravenous 3 times per day  . pantoprazole  40 mg Oral Daily  . QUEtiapine  50 mg Oral QHS  . senna-docusate  1 tablet Oral BID   Infusions:   PRN: acetaminophen **OR** acetaminophen, ALPRAZolam, alum & mag hydroxide-simeth, hydrALAZINE, HYDROcodone-acetaminophen, Influenza vac split quadrivalent PF, metoprolol, morphine injection, ondansetron **OR** ondansetron (ZOFRAN) IV, pneumococcal 23 valent vaccine  Assessment: 69 y/o F with acute respiratory failure on ciprofloxacin for ESBL E coli UTI. Tracheal aspirate culture growing Pseudomonas resistant to all but imipenem. Patient is currently on day 5 of meropenem.   Plan:  Will continue Merrem 1 g IV q8 hours. Patient needs a total of 14 days of therapy.   Luisa Hart D 12/22/2014,11:24 AM

## 2014-12-22 NOTE — Progress Notes (Signed)
Patient anxious and asked for xanax. Dyspnea at rest and more so with turning in bed.  o2 sats 92% on 40% fio2.  Patient pulled up in bed and repositioned, xanax given. Will continue to monitor.

## 2014-12-22 NOTE — Progress Notes (Signed)
Patient desating into mid 10's with some increased work of breathing.  Dr. Sung Amabile aware, passy muir valve removed by Dr. Sung Amabile and trach collar fio2 increased by Jonny Ruiz, RT. Will continue to monitor.

## 2014-12-22 NOTE — Progress Notes (Signed)
RASS 0. + F/C. Mildly labored on ATC - improved  Filed Vitals:   12/22/14 0900 12/22/14 1005 12/22/14 1100 12/22/14 1200  BP: 155/85 132/76 134/77 139/73  Pulse: 93 89 93 92  Temp: 97.7 F (36.5 C) 97.5 F (36.4 C) 97.5 F (36.4 C) 97.9 F (36.6 C)  TempSrc:      Resp: Height:      Weight:      SpO2: 90% 91% 92% 95%   Tremulous  HEENT WNL Trach site clean Sternotomy scar noted No wheezes Reg, no M, prominent S2 Abd mildly distended, + tympani, NT, NABS Ext without edema, + chronic stasis changes No focal neuro deficits   BMP Latest Ref Rng 12/22/2014 12/21/2014 12/20/2014  Glucose 65 - 99 mg/dL 161(W) 960(A) 540(J)  BUN 6 - 20 mg/dL 81(X) 91(Y) 20  Creatinine 0.44 - 1.00 mg/dL 7.82 9.56 2.13  Sodium 135 - 145 mmol/L 140 140 139  Potassium 3.5 - 5.1 mmol/L 4.5 3.4(L) 3.7  Chloride 101 - 111 mmol/L 105 104 106  CO2 22 - 32 mmol/L Calcium 8.9 - 10.3 mg/dL 11.0(H) 10.6(H) 10.5(H)    CBC Latest Ref Rng 12/22/2014 12/21/2014 12/20/2014  WBC 3.6 - 11.0 K/uL 4.3 4.3 5.2  Hemoglobin 12.0 - 16.0 g/dL 0.8(M) 5.7(Q) 4.6(N)  Hematocrit 35.0 - 47.0 % 27.6(L) 26.5(L) 29.2(L)  Platelets 150 - 440 K/uL 121(L) 93(L) 97(L)    CXR: CM, bilateral effusions/atx   INDWELLING DEVICES:: Trach tube - chronic LUE PICC 9/11 >>   MICRO DATA: MRSA PCR 9/04 >> POS Urine 9/04 >> ESBL E coli Blood 9/04 >> NEG Resp 9/06 >> Citrobacter and pseudomonas  ANTIMICROBIALS:  Per ID service   IMPRESSION: Chronic trach dependence Acute on chronic respiratory failure  Likely pulm edema/CHF ESBL E coli  UTI Psedomonas/citrobacter tracheobronchitis vs PNA  PLAN/REC: Cont ATC as long as tolerated during daytime Cont mandatory nocturnal ventilation for now SLP eval for oral diet recs Abx per ID - Meropenem through 9/23 Lasix X 1 9/13 Seeking return to SNF - she may go once she is comfortable on trach collar 24 hrs/day  Billy Fischer, MD PCCM service Mobile  [phone] Pager [phone]

## 2014-12-22 NOTE — Progress Notes (Addendum)
Slept on and off during most of todays shift. Alert to self, place and situation but confused to time.  Denied feeling short of breath during shift but appears labored at times.  Non-labored breathing at this time with o2 sats 90% on trach collar. RR mid 20's at this time. Afib per cardiac monitor rate 91. Afebrile, but bair hugger applied for temp of 96.2. No visitors this shift.

## 2014-12-22 NOTE — Progress Notes (Signed)
Pt resting quietly remains on trach collar at 35% all night tolerated well. . Pt denies pain or discomfort. Had increase anxiety post bath and was given a xanax per her request.

## 2014-12-22 NOTE — Progress Notes (Signed)
Physical Therapy Treatment Patient Details Name: Rachel Brady MRN: [id number] DOB: 1945-04-12 Today's Date: 12/22/2014    History of Present Illness Pt here with respiratory failure.  Now alternating between vent and trach valve    PT Comments    Pt is able to perform bed exercises with intermittent desaturation but rebounds with short rest breaks. Pt soiled herself and prior to attempting upright at EOB needs to cleaned. PT practiced sequencing with rolling while CNA cleans patient. During rolling SaO2 drops to 62% and does not rebound for an extended time. Pt provided cues for deep breathing and SaO2 slowly rebounds to 87%. RN notified of desaturation and contacts RT to come assess patient. Unable to attempt upright at EOB or transfers at this time due to respiratory distress. Will attempt further mobility as respiratory status improves. Pt will benefit from skilled PT services to address deficits in strength, balance, and mobility in order to return to full function at home.    Follow Up Recommendations  LTACH     Equipment Recommendations   (To be assessed at next facility)    Recommendations for Other Services       Precautions / Restrictions Precautions Precautions: Fall Restrictions Weight Bearing Restrictions: No    Mobility  Bed Mobility Overal bed mobility: Needs Assistance Bed Mobility: Rolling Rolling: Mod assist         General bed mobility comments: Pt requires cues and instruction for rolling in bed from side to side during linen change. Pt instructed in heel slide as well as shoulder protraction and reaching across midline to use bedrail to assist with rolling. Following rolling in bed to clean soiled linen SaO2 drops to 62% and remains depressed for an extended time. RN notified who contacts RT to come assess patient. SaO2 slowly returns to 87% over the course of 4-5 minutes but never exceeds 87%. Further mobility deferred at this time  Transfers                  General transfer comment: Deferred at this time. Wanted to sit upright at EOB however pt is severly fatigued following rolling in bed duirng linen change.  Ambulation/Gait                 Stairs            Wheelchair Mobility    Modified Rankin (Stroke Patients Only)       Balance                                    Cognition Arousal/Alertness: Awake/alert Behavior During Therapy: WFL for tasks assessed/performed Overall Cognitive Status: Within Functional Limits for tasks assessed                      Exercises General Exercises - Upper Extremity Elbow Flexion: Strengthening;Left;10 reps;Supine Elbow Extension: Strengthening;Left;10 reps;Supine General Exercises - Lower Extremity Ankle Circles/Pumps: Strengthening;Both;10 reps;Supine Heel Slides: Strengthening;Both;10 reps;Supine Hip ABduction/ADduction: Strengthening;Both;10 reps;Supine Straight Leg Raises: Strengthening;Both;10 reps;Supine    General Comments        Pertinent Vitals/Pain Pain Assessment: No/denies pain    Home Living                      Prior Function            PT Goals (current goals can now be found in the care plan section)  Acute Rehab PT Goals Patient Stated Goal: pt would rather go home, but realizes she may be too weak PT Goal Formulation: With patient Time For Goal Achievement: 01/03/15 Potential to Achieve Goals: Fair Progress towards PT goals: Not progressing toward goals - comment (Respiratory status limiting therapy session)    Frequency  Min 2X/week    PT Plan Discharge plan needs to be updated    Co-evaluation             End of Session   Activity Tolerance: Patient limited by fatigue (Limited by SOB) Patient left: with nursing/sitter in room;in bed (RT contacted to assess patient)     Time: 1620-1640 PT Time Calculation (min) (ACUTE ONLY): 20 min  Charges:  $Therapeutic Exercise: 8-22 mins                     G Codes:      Sharalyn Ink Andrya Roppolo PT, DPT   Kallon Caylor 12/22/2014, 4:55 PM

## 2014-12-22 NOTE — Progress Notes (Signed)
Patient resting well on trach collar at this time and in no respiratory distress. Patient did not want to go on ventilator unless she became short of breath and needed it. Vent still in room on standby.

## 2014-12-22 NOTE — Care Management Important Message (Signed)
Important Message  Patient Details  Name: JASARA CORRIGAN MRN: [id number] Date of Birth: [birth year]   Medicare Important Message Given:  Yes-third notification given    Marily Memos, RN 12/22/2014, 10:43 AM

## 2014-12-23 ENCOUNTER — Inpatient Hospital Stay: Payer: Medicare Other

## 2014-12-23 LAB — BASIC METABOLIC PANEL
Anion gap: 6 (ref 5–15)
BUN: 27 mg/dL — AB (ref 6–20)
CHLORIDE: 106 mmol/L (ref 101–111)
CO2: 31 mmol/L (ref 22–32)
Calcium: 11.6 mg/dL — ABNORMAL HIGH (ref 8.9–10.3)
Creatinine, Ser: 0.94 mg/dL (ref 0.44–1.00)
GFR calc Af Amer: >60 mL/min (ref >60)
GFR calc non Af Amer: >60 mL/min (ref >60)
GLUCOSE: 92 mg/dL (ref 65–99)
POTASSIUM: 4.3 mmol/L (ref 3.5–5.1)
Sodium: 143 mmol/L (ref 135–145)

## 2014-12-23 LAB — CBC
HEMATOCRIT: 27.7 % — AB (ref 35.0–47.0)
Hemoglobin: 8.8 g/dL — ABNORMAL LOW (ref 12.0–16.0)
MCH: 27.2 pg (ref 26.0–34.0)
MCHC: 31.7 g/dL — AB (ref 32.0–36.0)
MCV: 85.9 fL (ref 80.0–100.0)
Platelets: 133 10*3/uL — ABNORMAL LOW (ref 150–440)
RBC: 3.23 MIL/uL — ABNORMAL LOW (ref 3.80–5.20)
RDW: 20.7 % — AB (ref 11.5–14.5)
WBC: 4.3 10*3/uL (ref 3.6–11.0)

## 2014-12-23 LAB — GLUCOSE, CAPILLARY
Glucose-Capillary: 92 mg/dL (ref 65–99)
Glucose-Capillary: 104 mg/dL — ABNORMAL HIGH (ref 65–99)
Glucose-Capillary: 84 mg/dL (ref 65–99)
Glucose-Capillary: 92 mg/dL (ref 65–99)

## 2014-12-23 MED ORDER — FUROSEMIDE 10 MG/ML IJ SOLN
40.0000 mg | Freq: Once | INTRAMUSCULAR | Status: AC
  Administered 2014-12-23: 40 mg via INTRAVENOUS
  Filled 2014-12-23: qty 4

## 2014-12-23 NOTE — Progress Notes (Addendum)
[facility] - [place] at [facility]   PATIENT NAME: Rachel Brady    MR#:  [id number]  DATE OF BIRTH:  20-Jun-1945  SUBJECTIVE:  CHIEF COMPLAINT:   Chief Complaint  Patient presents with  . Respiratory Arrest  Patient is  lethargic. On MV from last night. Failed TCT today am   REVIEW OF SYSTEMS:  Review of Systems  Unable to perform ROS  DRUG ALLERGIES:   Allergies  Allergen Reactions  . Codeine     Has tolerated oxycodone in the past.   VITALS:  Blood pressure 129/73, pulse 93, temperature 99.5 F (37.5 C), temperature source Core (Comment), resp. rate 21, height  (1.702 m), weight 105.7 kg (233 lb 0.4 oz), SpO2 100 %. PHYSICAL EXAMINATION:  Physical Exam  Constitutional: She is well-developed, well-nourished, and in no distress.  HENT:  Head: Normocephalic and atraumatic.  Trach in place  Eyes: Conjunctivae are normal. Pupils are equal, round, and reactive to light.  Neck: Normal range of motion. Neck supple. No tracheal deviation present. No thyromegaly present.  Cardiovascular: Normal rate, regular rhythm and normal heart sounds.   Pulmonary/Chest: Effort normal and breath sounds normal. No respiratory distress. She has no wheezes. She exhibits no tenderness.  Abdominal: Soft. Bowel sounds are normal. She exhibits no distension. There is no tenderness.  Musculoskeletal: Normal range of motion.  Neurological: No cranial nerve deficit.  Skin: Skin is warm and dry. No rash noted.  Psychiatric: Mood and affect normal.   LABORATORY PANEL:   CBC  Recent Labs Lab 12/23/14 0513  WBC 4.3  HGB 8.8*  HCT 27.7*  PLT 133*   ------------------------------------------------------------------------------------------------------------------ Chemistries   Recent Labs Lab 12/22/14 0439 12/23/14 0513  NA 140 143  K 4.5 4.3  CL 105 106  CO2 31 31  GLUCOSE 117* 92  BUN 25* 27*  CREATININE 0.81 0.94  CALCIUM 11.0* 11.6*  MG 1.9  --     RADIOLOGY:  Dg Chest Port 1 View  12/23/2014   CLINICAL DATA:  Respiratory failure.  EXAM: PORTABLE CHEST - 1 VIEW  COMPARISON:  Yesterday  FINDINGS: Tracheostomy tube remains well seated.  Left upper extremity PICC, tip at the upper cavoatrial junction.  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 No gross change from prior. Unchanged large left and small to moderate right pleural effusion with underlying lung opacity. No pulmonary edema seen in the upper lungs. No air leak. Remote bilateral rib fractures.  IMPRESSION: 1. Stable pleural effusions, large on the left, obscuring the lower lungs. 2. Chronic pulmonary venous congestion.   Electronically Signed   By: Marnee Spring M.D.   On: 12/23/2014 08:06   ASSESSMENT AND PLAN:  This is a 69 year old female with a history of tracheostomy, atrial fibrillation, diabetes who presents with sepsis.  1. Sepsis with hypotension: secondary to urinary tract infection with ESBL ecoli Hypotension resolved  Appreciate ID input. Abx changed to Meropenem day #6/14 Continue meropenem for a total of 14 days until 01/01/2015 as per ID recommendations. Status post PICC line 9/10  2. ESBL E.coli Urinary tract infection: changed to meropenem  Appreciate infectious disease input.    3. Healthcare acquired pneumonia:  Continue meropenem as trach suction sputum growing Pseudomonas & Citrobacter Chest x-ray with a dense unchanged bibasilar pulmonary infiltrates and edema with effusions, will rpt sputum cx , d/w ID today  4. Acute respiratory failure: on MV , failed  TCT today   Appreciate critical care management recommendations Goal is  to keep the pt on TCT for 24 hr prior to d/c  5. Atrial fibrillation: Her heart rate is well controlled at this time  6. Diabetes: Discontinued tube feeds   By mouth feeds today as tolerated, sliding scale insulin   7. Depression/anxiety: Anxiolytics as needed basis  8. Fungal skin infection: continue  nystatin.  9. Nutrition- discontinued tube feeds. Tolerating by mouth feeds and medications as well   10 . Deconditioning with PT as tolerated  Disposition-[facility] /snf. We will follow up with case management  Anticipating discharge in 1-2 days   All the records are reviewed and case discussed with Care Management/Social Worker. Plan of care discussed with the patient, RN and critical care team   CODE STATUS: Full code  TOTAL TIME (CRITICAL CARE) TAKING CARE OF THIS PATIENT: 35 minutes.   More than 50% of the time was spent in counseling/coordination of care: YES  Did peer-peer Review with [employer] but they declined - they recommend appeal if CM/SW would like.  Ramonita Lab M.D on 12/23/2014 at 10:24 AM  Between 7am to 6pm - Pager - [phone]  After 6pm go to [email or web address] - password [email or web address] [facility]  [place]  [facility]  Office  [phone]  CC: Primary care physician; Dorothey Baseman, MD

## 2014-12-23 NOTE — Progress Notes (Signed)
RASS 0. + F/C. Failed ATC attempt this AM. Comfortable on vent. Per RN, increased purulent secretions  Filed Vitals:   12/23/14 1000 12/23/14 1100 12/23/14 1200 12/23/14 1400  BP: 129/73 147/92 125/69   Pulse: 93 91 103   Temp: 99.5 F (37.5 C) 99.9 F (37.7 C) 99.9 F (37.7 C)   TempSrc:      Resp: Height:      Weight:    105.7 kg (233 lb 0.4 oz)  SpO2: 100% 100% 99%    Tremulous  HEENT WNL Trach site clean Sternotomy scar noted No wheezes Reg, no M, prominent S2 Abd mildly distended, + tympani, NT, NABS Ext without edema, + chronic stasis changes No focal neuro deficits   BMP Latest Ref Rng 12/23/2014 12/22/2014 12/21/2014  Glucose 65 - 99 mg/dL 92 960(A) 540(J)  BUN 6 - 20 mg/dL 81(X) 91(Y) 78(G)  Creatinine 0.44 - 1.00 mg/dL 9.56 2.13 0.86  Sodium 135 - 145 mmol/L 143 140 140  Potassium 3.5 - 5.1 mmol/L 4.3 4.5 3.4(L)  Chloride 101 - 111 mmol/L 106 105 104  CO2 22 - 32 mmol/L Calcium 8.9 - 10.3 mg/dL 11.6(H) 11.0(H) 10.6(H)    CBC Latest Ref Rng 12/23/2014 12/22/2014 12/21/2014  WBC 3.6 - 11.0 K/uL 4.3 4.3 4.3  Hemoglobin 12.0 - 16.0 g/dL 5.7(Q) 4.6(N) 6.2(X)  Hematocrit 35.0 - 47.0 % 27.7(L) 27.6(L) 26.5(L)  Platelets 150 - 440 K/uL 133(L) 121(L) 93(L)    CXR: NSC CM, bilateral effusions/atx   INDWELLING DEVICES:: Trach tube - chronic LUE PICC 9/11 >>   MICRO DATA: MRSA PCR 9/04 >> POS Urine 9/04 >> ESBL E coli Blood 9/04 >> NEG Resp 9/06 >> Citrobacter and pseudomonas Resp 9/14 >>   ANTIMICROBIALS:  Meropenem through 9/23 per ID service   IMPRESSION: Chronic trach dependence Acute on chronic respiratory failure  Likely pulm edema/CHF ESBL E coli  UTI Psedomonas/citrobacter tracheobronchitis vs PNA  PLAN/REC: Cont ATC as long as tolerated during daytime Abx per ID - Meropenem through 9/23 Lasix X 1 9/14 Recheck resp culture Seeking return to SNF - she may go once she is comfortable on trach collar 24 hrs/day  Billy Fischer, MD PCCM service Mobile [phone] Pager [phone]

## 2014-12-23 NOTE — Care Management Note (Signed)
Case Management Note  Patient Details  Name: CHERITA HEBEL MRN: [id number] Date of Birth: Nov 02, 1945  Subjective/Objective:   Noted PT recommendations. Insurance has declined LTACH. Patient to return to [facility] at discharge                 Action/Plan: [facility]  Expected Discharge Date:                  Expected Discharge Plan:  Skilled Nursing Facility  In-House Referral:  Clinical Social Work  Discharge planning Services  CM Consult  Post Acute Care Choice:  SNF Choice offered to:  Adult Children  DME Arranged:    DME Agency:     HH Arranged:    HH Agency:     Status of Service:  In process, will continue to follow  Medicare Important Message Given:  Yes-third notification given Date Medicare IM Given:    Medicare IM give by:    Date Additional Medicare IM Given:    Additional Medicare Important Message give by:     If discussed at Long Length of Stay Meetings, dates discussed:    Additional Comments:  Marily Memos, RN 12/23/2014, 8:49 AM

## 2014-12-23 NOTE — Progress Notes (Signed)
PT Hold Note  Patient Details Name: Rachel Brady MRN: [id number] DOB: [birth year]   Cancelled Treatment:    Reason Eval/Treat Not Completed: Patient's level of consciousness. Chart reviewed and RN consulted. Pt has been agitated and was unable to wean from vent support. RN states that pt is currently obtunded and not appropriate for PT treatment. Will attempt PT treatment on a different date/time as patient is appropriate.  Sharalyn Ink Eevie Lapp PT, DPT   Medea Deines 12/23/2014, 5:09 PM

## 2014-12-23 NOTE — Progress Notes (Signed)
Pt.lying in bed resting quietly at present, pt was placed on vent .08/23/38%/500 pt. Was lethargic at start of shift and was placed on vent the patient became more alert around 3am with bath and started watching TV. Pt still remains on vent.

## 2014-12-23 NOTE — Progress Notes (Signed)
ANTIBIOTIC CONSULT NOTE - FOLLOW UP   Pharmacy Consult for Meropenm Indication: ESBL UTI/Pseudomonas PNA  Allergies  Allergen Reactions  . Codeine     Has tolerated oxycodone in the past.    Patient Measurements: Height: 5\' 7"  (170.2 cm) Weight: 233 lb 0.4 oz (105.7 kg) IBW/kg (Calculated) : 61.6   Vital Signs: Temp: 99.5 F (37.5 C) (09/14 1000) BP: 129/73 mmHg (09/14 1000) Pulse Rate: 93 (09/14 1000) Intake/Output from previous day: 09/13 0701 - 09/14 0700 In: 750 [P.O.:450; IV Piggyback:300] Out: 1575 [Urine:1575] Intake/Output from this shift: Total I/O In: 200 [P.O.:200] Out: -   Labs:  Recent Labs  12/21/14 0523 12/22/14 0439 12/23/14 0513  WBC 4.3 4.3 4.3  HGB 8.4* 8.5* 8.8*  PLT 93* 121* 133*  CREATININE 0.75 0.81 0.94   Estimated Creatinine Clearance: 71.6 mL/min (by C-G formula based on Cr of 0.94). No results for input(s): VANCOTROUGH, VANCOPEAK, VANCORANDOM, GENTTROUGH, GENTPEAK, GENTRANDOM, TOBRATROUGH, TOBRAPEAK, TOBRARND, AMIKACINPEAK, AMIKACINTROU, AMIKACIN in the last 72 hours.   Microbiology: Recent Results (from the past 720 hour(s))  Blood Culture (routine x 2)     Status: None   Collection Time: 12/13/14  9:25 AM  Result Value Ref Range Status   Specimen Description BLOOD LEFT ARM  Final   Special Requests BOTTLES DRAWN AEROBIC AND ANAEROBIC  Final   Culture  Setup Time   Final    GRAM POSITIVE COCCI IN CLUSTERS ANAEROBIC BOTTLE ONLY CRITICAL RESULT CALLED TO, READ BACK BY AND VERIFIED WITH: LESLIE LEWIS 12/14/2014 0630 [facility] CONFIRMED BY [facility]    Culture   Final    COAGULASE NEGATIVE STAPHYLOCOCCUS ANAEROBIC BOTTLE ONLY Results consistent with contamination.    Report Status 12/18/2014 FINAL  Final  Blood Culture (routine x 2)     Status: None   Collection Time: 12/13/14  9:35 AM  Result Value Ref Range Status   Specimen Description BLOOD RIGHT FORARM  Final   Special Requests BOTTLES DRAWN AEROBIC AND ANAEROBIC  Final   Culture NO GROWTH 5 DAYS  Final   Report Status 12/18/2014 FINAL  Final  Urine culture     Status: None   Collection Time: 12/13/14  9:35 AM  Result Value Ref Range Status   Specimen Description URINE, RANDOM  Final   Special Requests NONE  Final   Culture   Final    >=100,000 COLONIES/mL ESCHERICHIA COLI ESBL-EXTENDED SPECTRUM BETA LACTAMASE-THE ORGANISM IS RESISTANT TO PENICILLINS, CEPHALOSPORINS AND AZTREONAM ACCORDING TO CLSI M100-S15 VOL.25 N01 JAN 2005. Results Called to: Derald Macleod AT 1119 12/15/14 CTJ    Report Status 12/15/2014 FINAL  Final   Organism ID, Bacteria ESCHERICHIA COLI  Final      Susceptibility   Escherichia coli - MIC*    AMPICILLIN >=32 RESISTANT Resistant     CEFTAZIDIME 16 RESISTANT Resistant     CEFAZOLIN >=64 RESISTANT Resistant     CEFTRIAXONE 32 RESISTANT Resistant     GENTAMICIN <=1 SENSITIVE Sensitive     IMIPENEM <=0.25 SENSITIVE Sensitive     TRIMETH/SULFA >=320 RESISTANT Resistant     Extended ESBL POSITIVE Resistant     CIPROFLOXACIN Value in next row Sensitive      SENSITIVE<=0.25    NITROFURANTOIN Value in next row Sensitive      SENSITIVE<=16    PIP/TAZO Value in next row Resistant      RESISTANT>=128    ERTAPENEM Value in next row Sensitive      SENSITIVE<=0.5    * >=100,000  COLONIES/mL ESCHERICHIA COLI  MRSA PCR Screening     Status: Abnormal   Collection Time: 12/13/14 12:20 PM  Result Value Ref Range Status   MRSA by PCR POSITIVE (A) NEGATIVE Final    Comment:        The GeneXpert MRSA Assay (FDA approved for NASAL specimens only), is one component of a comprehensive MRSA colonization surveillance program. It is not intended to diagnose MRSA infection nor to guide or monitor treatment for MRSA infections. CRITICAL RESULT CALLED TO, READ BACK BY AND VERIFIED WITH: ADAM SCARBORO @1338  ON 12/13/14 BY HP   Culture, expectorated sputum-assessment     Status: None   Collection Time: 12/15/14 10:49 AM  Result Value Ref  Range Status   Specimen Description ENDOTRACHEAL  Final   Special Requests NONE  Final   Sputum evaluation THIS SPECIMEN IS ACCEPTABLE FOR SPUTUM CULTURE  Final   Report Status 12/16/2014 FINAL  Final  Culture, respiratory (NON-Expectorated)     Status: None   Collection Time: 12/15/14 10:49 AM  Result Value Ref Range Status   Specimen Description ENDOTRACHEAL  Final   Special Requests NONE Reflexed from [id number]  Final   Gram Stain   Final    FEW WBC SEEN FEW GRAM NEGATIVE RODS FEW GRAM POSITIVE COCCI IN PAIRS FAIR SPECIMEN - 70-80% WBCS    Culture   Final    MODERATE GROWTH CITROBACTER SPECIES HEAVY GROWTH PSEUDOMONAS AERUGINOSA MULTI-DRUG RESISTANT ORGANISM CRITICAL RESULT CALLED TO, READ BACK BY AND VERIFIED WITH: [facility] BORBA 12/18/14 1042 JGF    Report Status 12/19/2014 FINAL  Final   Organism ID, Bacteria PSEUDOMONAS AERUGINOSA  Final   Organism ID, Bacteria CITROBACTER SPECIES  Final      Susceptibility   Pseudomonas aeruginosa - MIC*    CEFTAZIDIME >=64 RESISTANT Resistant     CIPROFLOXACIN >=4 RESISTANT Resistant     GENTAMICIN >=16 RESISTANT Resistant     IMIPENEM 1 SENSITIVE Sensitive     PIP/TAZO Value in next row Resistant      RESISTANT>=128    * HEAVY GROWTH PSEUDOMONAS AERUGINOSA   Citrobacter species - MIC*    PIP/TAZO Value in next row Resistant      RESISTANT>=128    CEFTAZIDIME Value in next row Sensitive      SENSITIVE<=4    CEFEPIME Value in next row Sensitive      SENSITIVE<=1    CEFAZOLIN Value in next row Resistant      RESISTANT>=64    CEFTRIAXONE Value in next row Sensitive      SENSITIVE8    CIPROFLOXACIN Value in next row Sensitive      SENSITIVE<=0.25    GENTAMICIN Value in next row Sensitive      SENSITIVE<=1    IMIPENEM Value in next row Sensitive      SENSITIVE<=0.25    TRIMETH/SULFA Value in next row Sensitive      SENSITIVE<=20    * MODERATE GROWTH CITROBACTER SPECIES    Medical History: Past Medical History  Diagnosis Date  .  Tracheostomy in place   . Diabetes   . CKD (chronic kidney disease)   . OSA (obstructive sleep apnea)   . HTN (hypertension)   . MDD (major depressive disorder)     Medications:  Scheduled:  . antiseptic oral rinse  7 mL Mouth Rinse QID  . apixaban  5 mg Oral BID  . atorvastatin  80 mg Oral q1800  . chlorhexidine gluconate  15 mL Mouth Rinse BID  .  escitalopram  5 mg Oral Daily  . feeding supplement (GLUCERNA SHAKE)  237 mL Oral TID WC  . insulin aspart  0-5 Units Subcutaneous QHS  . insulin aspart  0-9 Units Subcutaneous TID WC  . ipratropium-albuterol  3 mL Nebulization Q6H  . meropenem (MERREM) IV  1 g Intravenous 3 times per day  . pantoprazole  40 mg Oral Daily  . QUEtiapine  50 mg Oral QHS  . senna-docusate  1 tablet Oral BID   Infusions:   PRN: acetaminophen **OR** acetaminophen, ALPRAZolam, alum & mag hydroxide-simeth, hydrALAZINE, HYDROcodone-acetaminophen, Influenza vac split quadrivalent PF, metoprolol, morphine injection, ondansetron **OR** ondansetron (ZOFRAN) IV, pneumococcal 23 valent vaccine  Assessment: 69 y/o F with acute respiratory failure on ciprofloxacin for ESBL E coli UTI. Tracheal aspirate culture growing Pseudomonas resistant to all but imipenem as well as Citrobacter. Patient is currently on day 6 of meropenem.   Plan:  Will continue Merrem 1 g IV q8 hours. Patient needs a total of 14 days of therapy.   Luisa Hart D 12/23/2014,11:56 AM

## 2014-12-23 NOTE — Progress Notes (Signed)
Nutrition Follow-up    INTERVENTION:   Coordination of Care: nutritional poc briefly discussed during ICU rounds; pt back and forth between trach collar and vent; continue diet as tolerated when on trach collar. Discussed poor intake yesterday (0% of meals) during ICU rounds and vent status today; per MD Simonds, possible NG placement tomorrow depending on pt status over next 24 hours Medical Food Supplement Therapy: Dysphagia II diet still active, pt able to take po when on trach collar; continue Glucerna shakes TID   NUTRITION DIAGNOSIS:   Inadequate oral intake related to acute illness as evidenced by NPO status. Being addressed as diet advanced, able to take po when on trach collar, plan for NG placement if on vent/not taking po  GOAL:   Patient will meet greater than or equal to 90% of their needs   MONITOR:    (Energy Intake, Pulmonary, Digestive System, Anthropometrics, Electrolyte/Renal Profile, Glucose Profile)  ASSESSMENT:    Pt on trach collar as tolerated, on vent during the night, trach collar this AM but back on vent support after 15 minutes   Diet Order:  DIET DYS 2 Room service appropriate?: Yes; Fluid consistency:: Thin   Energy Intake: unable to take breakfast today this AM due to vent status; recorded po intake yesterday 0% of meals, although documentation that pt did drink a Glucerna shake. Recorded po intake the day before 25% at dinner, 75% at lunch  Skin:   (stage II buttock)  Last BM:   9/14  Electrolyte and Renal Profile:  Recent Labs Lab 12/20/14 0351 12/21/14 0523 12/22/14 0439 12/23/14 0513  BUN 20 24* 25* 27*  CREATININE 0.78 0.75 0.81 0.94  NA 139 140 140 143  K 3.7 3.4* 4.5 4.3  MG 1.7 1.6* 1.9  --   PHOS 2.4* 2.6 3.0  --    Glucose Profile:  Recent Labs  12/22/14 2109 12/23/14 0718 12/23/14 1133  GLUCAP 145* 92 104*   Meds: lasix, ss novolog, morphine, xanax  Height:   Ht Readings from Last 1 Encounters:  12/13/14   (1.702 m)    Weight:   Wt Readings from Last 1 Encounters:  12/22/14 233 lb 0.4 oz (105.7 kg)    BMI:  Body mass index is 36.49 kg/(m^2).  Estimated Nutritional Needs:   Kcal:  1610-9604 kcals (11-14 kcals/kg)   Protein:  122-153 g (2.0-2.5 g/kg) using IBW 61 kg  Fluid:  1525-1830 mL (25-30 ml/kg)   HIGH Care Level  Romelle Starcher MS, RD, LDN [phone] Pager

## 2014-12-23 NOTE — Progress Notes (Signed)
Patient requesting at 0730 to come off of vent, to have something to drink. RT, Staci, transitioned patient off of vent to 100% trach collar. Patient tachypneic, restless, face flushed, HR afib 130s. Patient requesting Xanax. 25 mg PO administered with trach collar inflated and [place]mu[place] valve in place. No relief, 2 mg IV morphine administered, RT still at bedside, placed back on ventilator.

## 2014-12-23 NOTE — Progress Notes (Signed)
Speech Therapy Note: reviewed chart notes; consulted NSG and RT re: pt's status. Pt was unable to tolerate weaning from vent to trach collar this morning w/ NSG. She remains on vent support and is sleeping at this time after medications were given for pt's agitation. Pt is NPO as she is unable to tolerate trach collar (PMV placement). SLP will f/u tomorrow. NSG agreed.

## 2014-12-24 DIAGNOSIS — J151 Pneumonia due to Pseudomonas: Secondary | ICD-10-CM

## 2014-12-24 DIAGNOSIS — Z930 Tracheostomy status: Secondary | ICD-10-CM

## 2014-12-24 LAB — GLUCOSE, CAPILLARY
Glucose-Capillary: 72 mg/dL (ref 65–99)
Glucose-Capillary: 66 mg/dL (ref 65–99)
Glucose-Capillary: 200 mg/dL — ABNORMAL HIGH (ref 65–99)

## 2014-12-24 MED ORDER — METOPROLOL TARTRATE 1 MG/ML IV SOLN
2.5000 mg | Freq: Four times a day (QID) | INTRAVENOUS | Status: DC
  Administered 2014-12-24 – 2014-12-26 (×8): 2.5 mg via INTRAVENOUS
  Filled 2014-12-24 (×8): qty 5

## 2014-12-24 MED ORDER — MORPHINE SULFATE (PF) 2 MG/ML IV SOLN
1.0000 mg | INTRAVENOUS | Status: DC | PRN
  Administered 2014-12-24: 1 mg via INTRAVENOUS
  Administered 2014-12-24: 2 mg via INTRAVENOUS
  Filled 2014-12-24 (×2): qty 1

## 2014-12-24 MED ORDER — FUROSEMIDE 10 MG/ML IJ SOLN
40.0000 mg | Freq: Once | INTRAMUSCULAR | Status: AC
  Administered 2014-12-24: 40 mg via INTRAVENOUS
  Filled 2014-12-24: qty 4

## 2014-12-24 NOTE — Care Management Note (Signed)
Case Management Note  Patient Details  Name: Rachel Brady MRN: [id number] Date of Birth: Aug 30, 1945  Subjective/Objective:   ON trach collar for the next 24 hours if she is able to tolerate. Needs to be below 28% for SNF. Currently at 40%. Following. CSW updated.                  Action/Plan: SNF   Expected Discharge Date:                  Expected Discharge Plan:  Skilled Nursing Facility  In-House Referral:  Clinical Social Work  Discharge planning Services  CM Consult  Post Acute Care Choice:  Long Term Acute Care (LTAC) Choice offered to:  Adult Children  DME Arranged:    DME Agency:     HH Arranged:    HH Agency:     Status of Service:  In process, will continue to follow  Medicare Important Message Given:  Yes-fourth notification given Date Medicare IM Given:    Medicare IM give by:    Date Additional Medicare IM Given:    Additional Medicare Important Message give by:     If discussed at Long Length of Stay Meetings, dates discussed:    Additional Comments:  Marily Memos, RN 12/24/2014, 1:45 PM

## 2014-12-24 NOTE — Plan of Care (Signed)
Problem: SLP PMSV Goals Goal: Misc PMSV Goal #1 Pt will understand and verbalize need for placement of PMV during all meals in order to aid swallowing.

## 2014-12-24 NOTE — Progress Notes (Signed)
Physical Therapy Treatment Patient Details Name: Rachel Brady MRN: [id number] DOB: Aug 21, 1945 Today's Date: 12/24/2014    History of Present Illness Pt here with respiratory failure.  Now alternating between vent and trach valve    PT Comments    Deferred bed mobility and transfers at this time due to respiratory status. Pt just taken off vent this AM and she has history of significant desaturation with therapist during bed mobility in last Pt session. Pt able to complete all bed exercises with mild increase in HR from 105-110 at rest to 120-125 with exercises. SaO2 remains >95% during entire session. Pt will benefit from skilled PT services to address deficits in strength, balance, and mobility in order to return to full function at home.    Follow Up Recommendations  LTACH     Equipment Recommendations  Rolling walker with 5" wheels    Recommendations for Other Services       Precautions / Restrictions Precautions Precautions: Fall Restrictions Weight Bearing Restrictions: No    Mobility  Bed Mobility               General bed mobility comments: Deferred bed mobility and transfers at this time as pt just removed from vent this AM.  Transfers                    Ambulation/Gait                 Stairs            Wheelchair Mobility    Modified Rankin (Stroke Patients Only)       Balance                                    Cognition Arousal/Alertness: Awake/alert Behavior During Therapy: WFL for tasks assessed/performed Overall Cognitive Status: Within Functional Limits for tasks assessed                      Exercises General Exercises - Upper Extremity Shoulder Flexion: Strengthening;Left;10 reps;Supine Elbow Flexion: Strengthening;Left;10 reps;Supine Elbow Extension: Strengthening;Left;10 reps;Supine General Exercises - Lower Extremity Ankle Circles/Pumps: Strengthening;Both;10 reps;Supine Quad Sets:  AROM;Both;10 reps;Supine Gluteal Sets: Strengthening;Both;10 reps;Supine Short Arc Quad: Strengthening;Both;10 reps;Supine Heel Slides: Strengthening;Both;10 reps;Supine Hip ABduction/ADduction: Strengthening;Both;10 reps;Supine Straight Leg Raises: Strengthening;Both;10 reps;Supine    General Comments        Pertinent Vitals/Pain Pain Assessment: No/denies pain    Home Living                      Prior Function            PT Goals (current goals can now be found in the care plan section) Acute Rehab PT Goals Patient Stated Goal: pt would rather go home, but realizes she may be too weak PT Goal Formulation: With patient Time For Goal Achievement: 01/03/15 Potential to Achieve Goals: Fair Progress towards PT goals: Not progressing toward goals - comment (Unable to progress due to respiratory status)    Frequency  Min 2X/week    PT Plan Current plan remains appropriate    Co-evaluation             End of Session Equipment Utilized During Treatment: Gait belt Activity Tolerance: Patient limited by fatigue;Treatment limited secondary to medical complications (Comment) Patient left: in bed;with call bell/phone within reach     Time: 1140-1210 PT Time  Calculation (min) (ACUTE ONLY): 30 min  Charges:  $Therapeutic Exercise: 8-22 mins                    G Codes:      Sharalyn Ink Tahesha Skeet PT, DPT   Tamarah Bhullar 12/24/2014, 1:15 PM

## 2014-12-24 NOTE — Plan of Care (Signed)
Problem: SLP Dysphagia Goals Goal: Misc Dysphagia Goal Pt will safely tolerate po diet of least restrictive consistency w/ no overt s/s of aspiration noted by Staff/pt/family x3 sessions.    

## 2014-12-24 NOTE — Progress Notes (Signed)
ANTIBIOTIC CONSULT NOTE - FOLLOW UP   Pharmacy Consult for Meropenm Indication: ESBL UTI/Pseudomonas PNA  Allergies  Allergen Reactions  . Codeine     Has tolerated oxycodone in the past.    Patient Measurements: Height: 5\' 7"  (170.2 cm) Weight: 229 lb 0.9 oz (103.9 kg) IBW/kg (Calculated) : 61.6   Vital Signs: Temp: 99.5 F (37.5 C) (09/15 0800) BP: 169/78 mmHg (09/15 0800) Pulse Rate: 91 (09/15 0800) Intake/Output from previous day: 09/14 0701 - 09/15 0700 In: 200 [IV Piggyback:200] Out: 1675 [Urine:1675] Intake/Output from this shift:    Labs:  Recent Labs  12/22/14 0439 12/23/14 0513  WBC 4.3 4.3  HGB 8.5* 8.8*  PLT 121* 133*  CREATININE 0.81 0.94   Estimated Creatinine Clearance: 71 mL/min (by C-G formula based on Cr of 0.94). No results for input(s): VANCOTROUGH, VANCOPEAK, VANCORANDOM, GENTTROUGH, GENTPEAK, GENTRANDOM, TOBRATROUGH, TOBRAPEAK, TOBRARND, AMIKACINPEAK, AMIKACINTROU, AMIKACIN in the last 72 hours.   Microbiology: Recent Results (from the past 720 hour(s))  Blood Culture (routine x 2)     Status: None   Collection Time: 12/13/14  9:25 AM  Result Value Ref Range Status   Specimen Description BLOOD LEFT ARM  Final   Special Requests BOTTLES DRAWN AEROBIC AND ANAEROBIC  Final   Culture  Setup Time   Final    GRAM POSITIVE COCCI IN CLUSTERS ANAEROBIC BOTTLE ONLY CRITICAL RESULT CALLED TO, READ BACK BY AND VERIFIED WITH: LESLIE LEWIS 12/14/2014 0630 [facility] CONFIRMED BY [facility]    Culture   Final    COAGULASE NEGATIVE STAPHYLOCOCCUS ANAEROBIC BOTTLE ONLY Results consistent with contamination.    Report Status 12/18/2014 FINAL  Final  Blood Culture (routine x 2)     Status: None   Collection Time: 12/13/14  9:35 AM  Result Value Ref Range Status   Specimen Description BLOOD RIGHT FORARM  Final   Special Requests BOTTLES DRAWN AEROBIC AND ANAEROBIC  Final   Culture NO GROWTH 5 DAYS  Final   Report Status 12/18/2014 FINAL  Final  Urine  culture     Status: None   Collection Time: 12/13/14  9:35 AM  Result Value Ref Range Status   Specimen Description URINE, RANDOM  Final   Special Requests NONE  Final   Culture   Final    >=100,000 COLONIES/mL ESCHERICHIA COLI ESBL-EXTENDED SPECTRUM BETA LACTAMASE-THE ORGANISM IS RESISTANT TO PENICILLINS, CEPHALOSPORINS AND AZTREONAM ACCORDING TO CLSI M100-S15 VOL.25 N01 JAN 2005. Results Called to: Derald Macleod AT 1119 12/15/14 CTJ    Report Status 12/15/2014 FINAL  Final   Organism ID, Bacteria ESCHERICHIA COLI  Final      Susceptibility   Escherichia coli - MIC*    AMPICILLIN >=32 RESISTANT Resistant     CEFTAZIDIME 16 RESISTANT Resistant     CEFAZOLIN >=64 RESISTANT Resistant     CEFTRIAXONE 32 RESISTANT Resistant     GENTAMICIN <=1 SENSITIVE Sensitive     IMIPENEM <=0.25 SENSITIVE Sensitive     TRIMETH/SULFA >=320 RESISTANT Resistant     Extended ESBL POSITIVE Resistant     CIPROFLOXACIN Value in next row Sensitive      SENSITIVE<=0.25    NITROFURANTOIN Value in next row Sensitive      SENSITIVE<=16    PIP/TAZO Value in next row Resistant      RESISTANT>=128    ERTAPENEM Value in next row Sensitive      SENSITIVE<=0.5    * >=100,000 COLONIES/mL ESCHERICHIA COLI  MRSA PCR Screening     Status:  Abnormal   Collection Time: 12/13/14 12:20 PM  Result Value Ref Range Status   MRSA by PCR POSITIVE (A) NEGATIVE Final    Comment:        The GeneXpert MRSA Assay (FDA approved for NASAL specimens only), is one component of a comprehensive MRSA colonization surveillance program. It is not intended to diagnose MRSA infection nor to guide or monitor treatment for MRSA infections. CRITICAL RESULT CALLED TO, READ BACK BY AND VERIFIED WITH: ADAM SCARBORO @1338  ON 12/13/14 BY HP   Culture, expectorated sputum-assessment     Status: None   Collection Time: 12/15/14 10:49 AM  Result Value Ref Range Status   Specimen Description ENDOTRACHEAL  Final   Special Requests NONE   Final   Sputum evaluation THIS SPECIMEN IS ACCEPTABLE FOR SPUTUM CULTURE  Final   Report Status 12/16/2014 FINAL  Final  Culture, respiratory (NON-Expectorated)     Status: None   Collection Time: 12/15/14 10:49 AM  Result Value Ref Range Status   Specimen Description ENDOTRACHEAL  Final   Special Requests NONE Reflexed from [id number]  Final   Gram Stain   Final    FEW WBC SEEN FEW GRAM NEGATIVE RODS FEW GRAM POSITIVE COCCI IN PAIRS FAIR SPECIMEN - 70-80% WBCS    Culture   Final    MODERATE GROWTH CITROBACTER SPECIES HEAVY GROWTH PSEUDOMONAS AERUGINOSA MULTI-DRUG RESISTANT ORGANISM CRITICAL RESULT CALLED TO, READ BACK BY AND VERIFIED WITH: [facility] BORBA 12/18/14 1042 JGF    Report Status 12/19/2014 FINAL  Final   Organism ID, Bacteria PSEUDOMONAS AERUGINOSA  Final   Organism ID, Bacteria CITROBACTER SPECIES  Final      Susceptibility   Pseudomonas aeruginosa - MIC*    CEFTAZIDIME >=64 RESISTANT Resistant     CIPROFLOXACIN >=4 RESISTANT Resistant     GENTAMICIN >=16 RESISTANT Resistant     IMIPENEM 1 SENSITIVE Sensitive     PIP/TAZO Value in next row Resistant      RESISTANT>=128    * HEAVY GROWTH PSEUDOMONAS AERUGINOSA   Citrobacter species - MIC*    PIP/TAZO Value in next row Resistant      RESISTANT>=128    CEFTAZIDIME Value in next row Sensitive      SENSITIVE<=4    CEFEPIME Value in next row Sensitive      SENSITIVE<=1    CEFAZOLIN Value in next row Resistant      RESISTANT>=64    CEFTRIAXONE Value in next row Sensitive      SENSITIVE8    CIPROFLOXACIN Value in next row Sensitive      SENSITIVE<=0.25    GENTAMICIN Value in next row Sensitive      SENSITIVE<=1    IMIPENEM Value in next row Sensitive      SENSITIVE<=0.25    TRIMETH/SULFA Value in next row Sensitive      SENSITIVE<=20    * MODERATE GROWTH CITROBACTER SPECIES  Culture, respiratory (NON-Expectorated)     Status: None (Preliminary result)   Collection Time: 12/23/14 12:00 PM  Result Value Ref Range  Status   Specimen Description SPUTUM  Final   Special Requests NONE  Final   Gram Stain PENDING  Incomplete   Culture   Final    HEAVY GROWTH GRAM NEGATIVE RODS IDENTIFICATION AND SUSCEPTIBILITIES TO FOLLOW    Report Status PENDING  Incomplete    Medical History: Past Medical History  Diagnosis Date  . Tracheostomy in place   . Diabetes   . CKD (chronic kidney disease)   . OSA (  obstructive sleep apnea)   . HTN (hypertension)   . MDD (major depressive disorder)     Medications:  Scheduled:  . antiseptic oral rinse  7 mL Mouth Rinse QID  . apixaban  5 mg Oral BID  . atorvastatin  80 mg Oral q1800  . chlorhexidine gluconate  15 mL Mouth Rinse BID  . escitalopram  5 mg Oral Daily  . feeding supplement (GLUCERNA SHAKE)  237 mL Oral TID WC  . furosemide  40 mg Intravenous Once  . insulin aspart  0-5 Units Subcutaneous QHS  . insulin aspart  0-9 Units Subcutaneous TID WC  . ipratropium-albuterol  3 mL Nebulization Q6H  . meropenem (MERREM) IV  1 g Intravenous 3 times per day  . metoprolol  2.5 mg Intravenous 4 times per day  . pantoprazole  40 mg Oral Daily  . QUEtiapine  50 mg Oral QHS  . senna-docusate  1 tablet Oral BID   Infusions:   PRN: acetaminophen **OR** acetaminophen, ALPRAZolam, alum & mag hydroxide-simeth, hydrALAZINE, HYDROcodone-acetaminophen, Influenza vac split quadrivalent PF, metoprolol, morphine injection, ondansetron **OR** ondansetron (ZOFRAN) IV, pneumococcal 23 valent vaccine  Assessment: 69 y/o F with acute respiratory failure on ciprofloxacin for ESBL E coli UTI. Tracheal aspirate culture growing Pseudomonas resistant to all but imipenem as well as Citrobacter. Patient is currently on day 7 of meropenem and will need to continue therapy through 09/23 per ID.   Plan:  Will continue Merrem 1 g IV q8 hours.   Rachel Brady D 12/24/2014,1:17 PM

## 2014-12-24 NOTE — Progress Notes (Addendum)
RASS 0. + F/C. Comfortable on ATC.   Filed Vitals:   12/24/14 0500 12/24/14 0600 12/24/14 0700 12/24/14 0800  BP: 161/93 168/143 154/92 169/78  Pulse: 99 95 83 91  Temp: 99.3 F (37.4 C) 99.3 F (37.4 C) 99.3 F (37.4 C) 99.5 F (37.5 C)  TempSrc:      Resp: Height:      Weight: 103.9 kg (229 lb 0.9 oz)     SpO2: 100% 100% 100% 100%   NAD HEENT WNL Trach site clean Sternotomy scar noted No wheezes Reg, no M, prominent S2 Abd mildly distended, + tympani, NT, NABS Ext without edema, + chronic stasis changes No focal neuro deficits   BMP Latest Ref Rng 12/23/2014 12/22/2014 12/21/2014  Glucose 65 - 99 mg/dL 92 811(B) 147(W)  BUN 6 - 20 mg/dL 29(F) 62(Z) 30(Q)  Creatinine 0.44 - 1.00 mg/dL 6.57 8.46 9.62  Sodium 135 - 145 mmol/L 143 140 140  Potassium 3.5 - 5.1 mmol/L 4.3 4.5 3.4(L)  Chloride 101 - 111 mmol/L 106 105 104  CO2 22 - 32 mmol/L Calcium 8.9 - 10.3 mg/dL 11.6(H) 11.0(H) 10.6(H)    CBC Latest Ref Rng 12/23/2014 12/22/2014 12/21/2014  WBC 3.6 - 11.0 K/uL 4.3 4.3 4.3  Hemoglobin 12.0 - 16.0 g/dL 9.5(M) 8.4(X) 3.2(G)  Hematocrit 35.0 - 47.0 % 27.7(L) 27.6(L) 26.5(L)  Platelets 150 - 440 K/uL 133(L) 121(L) 93(L)    CXR: NNF   INDWELLING DEVICES:: Trach tube - chronic LUE PICC 9/11 >>   MICRO DATA: MRSA PCR 9/04 >> POS Urine 9/04 >> ESBL E coli Blood 9/04 >> NEG Resp 9/06 >> Citrobacter and pseudomonas Resp 9/14 >> Heavy growth GNRs >>   ANTIMICROBIALS:  Meropenem through 9/23 per ID service   IMPRESSION: Chronic trach dependence Acute on chronic respiratory failure  Likely pulm edema/CHF ESBL E coli  UTI Psedomonas/citrobacter tracheobronchitis vs PNA  PLAN/REC: Cont ATC as long as tolerated during daytime Abx per ID - Meropenem through 9/23 Repeat Lasix X 1 9/15 F/U resp culture from 9/14 Recheck CXR 9/16 AM Seeking return to SNF - she may go once she is comfortable on trach collar 24 hrs/day  Billy Fischer, MD PCCM  service Mobile [phone] Pager [phone]

## 2014-12-24 NOTE — Progress Notes (Signed)
[facility] [place] [facility]   PATIENT NAME: Rachel Brady    MR#:  [id number]  DATE OF BIRTH:  1945-05-03  SUBJECTIVE:  CHIEF COMPLAINT:   Chief Complaint  Patient presents with  . Respiratory Arrest   has been on trach collar and has been tolerating this  REVIEW OF SYSTEMS:  Review of Systems  Unable to perform ROS  DRUG ALLERGIES:   Allergies  Allergen Reactions  . Codeine     Has tolerated oxycodone in the past.   VITALS:  Blood pressure 169/78, pulse 91, temperature 99.5 F (37.5 C), temperature source Core (Comment), resp. rate 18, height 5\' 7"  (1.702 m), weight 103.9 kg (229 lb 0.9 oz), SpO2 100 %. PHYSICAL EXAMINATION:  Physical Exam  Constitutional: She is well-developed, well-nourished, and in no distress.  HENT:  Head: Normocephalic and atraumatic.  Trach in place  Eyes: Conjunctivae are normal. Pupils are equal, round, and reactive to light.  Neck: Normal range of motion. Neck supple. No tracheal deviation present. No thyromegaly present.  Cardiovascular: Normal rate, regular rhythm and normal heart sounds.   Pulmonary/Chest: Effort normal and breath sounds normal. No respiratory distress. She has no wheezes. She exhibits no tenderness.  Abdominal: Soft. Bowel sounds are normal. She exhibits no distension. There is no tenderness.  Musculoskeletal: Normal range of motion.  Neurological: No cranial nerve deficit.  Skin: Skin is warm and dry. No rash noted.  Psychiatric: Mood and affect normal.   LABORATORY PANEL:   CBC  Recent Labs Lab 12/23/14 0513  WBC 4.3  HGB 8.8*  HCT 27.7*  PLT 133*   ------------------------------------------------------------------------------------------------------------------ Chemistries   Recent Labs Lab 12/22/14 0439 12/23/14 0513  NA 140 143  K 4.5 4.3  CL 105 106  CO2 31 31  GLUCOSE 117* 92  BUN 25* 27*  CREATININE 0.81 0.94  CALCIUM 11.0* 11.6*  MG 1.9  --     RADIOLOGY:  No results found. ASSESSMENT AND PLAN:  This is a 69 year old female with a history of tracheostomy, atrial fibrillation, diabetes who presents with sepsis.  1. Sepsis with hypotension: secondary to urinary tract infection with ESBL ecoli Hypotension resolved  Appreciate ID input. Abx changed to Meropenem day #6/14 Continue meropenem for a total of 14 days until 01/01/2015 as per ID recommendations. Status post PICC line 9/10  2. ESBL E.coli Urinary tract infection: changed to meropenem  Appreciate infectious disease input.    3. Healthcare acquired pneumonia:  Continue meropenem as trach suction sputum growing Pseudomonas & Citrobacter  4. Acute respiratory failure: Trach collar for pulmonary  5. Atrial fibrillation: Her heart rate is well controlled at this time  6. Diabetes: Discontinued tube feeds   By mouth feeds today as tolerated, sliding scale insulin   7. Depression/anxiety: Anxiolytics as needed basis  8. Fungal skin infection: continue nystatin.  9. Nutrition- discontinued tube feeds. Tolerating by mouth feeds and medications as well   10 . Deconditioning with PT as tolerated  Disposition-[facility] /snf.  CODE STATUS: Full code  TOTAL TIME (CRITICAL CARE) TAKING CARE OF THIS PATIENT: 35 minutes.   More than 50% of the time was spent in counseling/coordination of care: YES Did peer-peer Review with [employer] but they declined - they recommend appeal if CM/SW would like.  Auburn Bilberry M.D on 12/24/2014 at 1:26 PM  Between 7am to 6pm - Pager - [phone]  After 6pm go to [email or web address] - password [facility]  Office  [phone]  CC: Primary care physician; Dorothey Baseman, MD

## 2014-12-24 NOTE — Care Management Important Message (Signed)
Important Message  Patient Details  Name: Rachel Brady MRN: [id number] Date of Birth: [birth year]   Medicare Important Message Given:  Yes-fourth notification given    Marily Memos, RN 12/24/2014, 1:29 PM

## 2014-12-24 NOTE — Progress Notes (Signed)
Nutrition Follow-up    INTERVENTION:  Meals and snacks: Cater to pt preferences Medical Nutrition Supplement Therapy: Continue glucerna for added nutrition at this time    NUTRITION DIAGNOSIS:   Inadequate oral intake related to acute illness as evidenced by NPO status.being addressed with po diet and supplements    GOAL:   Patient will meet greater than or equal to 90% of their needs    MONITOR:    (Energy Intake, Pulmonary, Digestive System, Anthropometrics, Electrolyte/Renal Profile, Glucose Profile)  REASON FOR ASSESSMENT:   Consult Enteral/tube feeding initiation and management  ASSESSMENT:      Pt on trach collar this am and tolerating   Current Nutrition: started back on po diet today   Gastrointestinal Profile: Last BM: 9/15   Medications: reviewed  Electrolyte/Renal Profile and Glucose Profile:   Recent Labs Lab 12/20/14 0351 12/21/14 0523 12/22/14 0439 12/23/14 0513  NA 139 140 140 143  K 3.7 3.4* 4.5 4.3  CL 106 104 105 106  CO2 BUN 20 24* 25* 27*  CREATININE 0.78 0.75 0.81 0.94  CALCIUM 10.5* 10.6* 11.0* 11.6*  MG 1.7 1.6* 1.9  --   PHOS 2.4* 2.6 3.0  --   GLUCOSE 105* 132* 117* 92        Weight Trend since Admission: Filed Weights   12/22/14 0511 12/23/14 1400 12/24/14 0500  Weight: 233 lb 0.4 oz (105.7 kg) 233 lb 0.4 oz (105.7 kg) 229 lb 0.9 oz (103.9 kg)      Diet Order:  DIET DYS 2 Room service appropriate?: Yes; Fluid consistency:: Thin  Skin:   (stage II buttock)   Height:   Ht Readings from Last 1 Encounters:  12/13/14  (1.702 m)    Weight:   Wt Readings from Last 1 Encounters:  12/24/14 229 lb 0.9 oz (103.9 kg)       BMI:  Body mass index is 35.87 kg/(m^2).  Estimated Nutritional Needs:   Kcal:  1610-9604 kcals (11-14 kcals/kg)   Protein:  122-153 g (2.0-2.5 g/kg) using IBW 61 kg  Fluid:  1525-1830 mL (25-30 ml/kg)   EDUCATION NEEDS:      HIGH Care Level   Stanley Lyness B.  Freida Busman, RD, LDN [phone] (pager).

## 2014-12-25 ENCOUNTER — Inpatient Hospital Stay: Payer: Medicare Other

## 2014-12-25 LAB — GLUCOSE, CAPILLARY
GLUCOSE-CAPILLARY: 172 mg/dL — AB (ref 65–99)
GLUCOSE-CAPILLARY: 133 mg/dL — AB (ref 65–99)
GLUCOSE-CAPILLARY: 140 mg/dL — AB (ref 65–99)
Glucose-Capillary: 110 mg/dL — ABNORMAL HIGH (ref 65–99)
Glucose-Capillary: 117 mg/dL — ABNORMAL HIGH (ref 65–99)
Glucose-Capillary: 122 mg/dL — ABNORMAL HIGH (ref 65–99)
Glucose-Capillary: 151 mg/dL — ABNORMAL HIGH (ref 65–99)

## 2014-12-25 LAB — BASIC METABOLIC PANEL
Anion gap: 5 (ref 5–15)
BUN: 26 mg/dL — AB (ref 6–20)
CHLORIDE: 103 mmol/L (ref 101–111)
CO2: 33 mmol/L — AB (ref 22–32)
CREATININE: 0.85 mg/dL (ref 0.44–1.00)
Calcium: 11.6 mg/dL — ABNORMAL HIGH (ref 8.9–10.3)
GFR calc Af Amer: >60 mL/min (ref >60)
GFR calc non Af Amer: >60 mL/min (ref >60)
Glucose, Bld: 129 mg/dL — ABNORMAL HIGH (ref 65–99)
Potassium: 4.1 mmol/L (ref 3.5–5.1)
Sodium: 141 mmol/L (ref 135–145)

## 2014-12-25 LAB — CULTURE, RESPIRATORY W GRAM STAIN

## 2014-12-25 LAB — CULTURE, RESPIRATORY

## 2014-12-25 LAB — CBC
HEMATOCRIT: 28.8 % — AB (ref 35.0–47.0)
Hemoglobin: 9.3 g/dL — ABNORMAL LOW (ref 12.0–16.0)
MCH: 27.6 pg (ref 26.0–34.0)
MCHC: 32.2 g/dL (ref 32.0–36.0)
MCV: 85.8 fL (ref 80.0–100.0)
PLATELETS: 157 10*3/uL (ref 150–440)
RBC: 3.35 MIL/uL — AB (ref 3.80–5.20)
RDW: 20.6 % — AB (ref 11.5–14.5)
WBC: 4.5 10*3/uL (ref 3.6–11.0)

## 2014-12-25 MED ORDER — MORPHINE SULFATE (PF) 2 MG/ML IV SOLN: 1.0000 mg | INTRAVENOUS | Status: DC | PRN

## 2014-12-25 MED ORDER — FUROSEMIDE 40 MG PO TABS
40.0000 mg | ORAL_TABLET | Freq: Every day | ORAL | Status: DC
  Administered 2014-12-25 – 2015-01-04 (×9): 40 mg via ORAL
  Filled 2014-12-25 (×9): qty 1

## 2014-12-25 MED ORDER — IPRATROPIUM-ALBUTEROL 0.5-2.5 (3) MG/3ML IN SOLN: 3.0000 mL | Freq: Four times a day (QID) | RESPIRATORY_TRACT | Status: DC | PRN

## 2014-12-25 MED ORDER — HYDROCODONE-ACETAMINOPHEN 5-325 MG PO TABS
1.0000 | ORAL_TABLET | ORAL | Status: DC | PRN
  Administered 2014-12-25 – 2014-12-26 (×2): 1 via ORAL
  Filled 2014-12-25 (×2): qty 1

## 2014-12-25 MED ORDER — FUROSEMIDE 10 MG/ML IJ SOLN
40.0000 mg | Freq: Once | INTRAMUSCULAR | Status: DC
Start: 2014-12-25 — End: 2014-12-25

## 2014-12-25 NOTE — Progress Notes (Signed)
Notified Dr. Sung Amabile of lab result: repeat sputum culture-Pseudomonas. No further orders, previous sputum (12/25/14)also (+) Pseudmonos

## 2014-12-25 NOTE — Progress Notes (Signed)
RASS 0. + F/C. Comfortable on ATC.   Filed Vitals:   12/25/14 0900 12/25/14 1100 12/25/14 1200 12/25/14 1300  BP: 121/73 117/66 125/66 125/65  Pulse:      Temp: 98.2 F (36.8 C)     TempSrc:      Resp: Height:      Weight:      SpO2:       NAD HEENT WNL Trach site clean Sternotomy scar noted No wheezes Reg, no M, prominent S2 Abd mildly distended, + tympani, NT, NABS Ext without edema, + chronic stasis changes No focal neuro deficits   BMP Latest Ref Rng 12/25/2014 12/23/2014 12/22/2014  Glucose 65 - 99 mg/dL 161(W) 92 960(A)  BUN 6 - 20 mg/dL 54(U) 98(J) 19(J)  Creatinine 0.44 - 1.00 mg/dL 4.78 2.95 6.21  Sodium 135 - 145 mmol/L 141 143 140  Potassium 3.5 - 5.1 mmol/L 4.1 4.3 4.5  Chloride 101 - 111 mmol/L 103 106 105  CO2 22 - 32 mmol/L 33(H) 31 31  Calcium 8.9 - 10.3 mg/dL 11.6(H) 11.6(H) 11.0(H)    CBC Latest Ref Rng 12/25/2014 12/23/2014 12/22/2014  WBC 3.6 - 11.0 K/uL 4.5 4.3 4.3  Hemoglobin 12.0 - 16.0 g/dL 3.0(Q) 6.5(H) 8.4(O)  Hematocrit 35.0 - 47.0 % 28.8(L) 27.7(L) 27.6(L)  Platelets 150 - 440 K/uL 157 133(L) 121(L)    CXR: persistent R pleural effusion   INDWELLING DEVICES:: Trach tube - chronic LUE PICC 9/11 >>   MICRO DATA: MRSA PCR 9/04 >> POS Urine 9/04 >> ESBL E coli Blood 9/04 >> NEG Resp 9/06 >> Citrobacter and pseudomonas Resp 9/14 >> pseudomonas  ANTIMICROBIALS:  Meropenem through 9/23 per ID service   IMPRESSION: Chronic trach dependence Acute on chronic respiratory failure  Likely pulm edema/CHF ESBL E coli  UTI Psedomonas/citrobacter tracheobronchitis vs PNA  PLAN/REC: Cont ATC as long as tolerated during daytime Abx per ID - Meropenem through 9/23 Resume home dose of daily Lasix I have recommended to Care Management that should would probably be best served in a vent SNF  Billy Fischer, MD PCCM service Mobile [phone] Pager [phone]

## 2014-12-25 NOTE — Progress Notes (Signed)
Nutrition Follow-up   INTERVENTION:   Medical Food Supplement Therapy: continue Glucerna TID Meals and Snacks: Cater to patient preferences  NUTRITION DIAGNOSIS:   Inadequate oral intake related to acute illness as evidenced by NPO status. Improving as diet advanced, tolerating some meals, supplements  GOAL:   Patient will meet greater than or equal to 90% of their needs  MONITOR:    (Energy Intake, Pulmonary, Digestive System, Anthropometrics, Electrolyte/Renal Profile, Glucose Profile)   ASSESSMENT:    Pt on trach collar; per ICU rounds, pt will require SNF that can support vent via trach at night or as needed, placement search in progress  Diet Order:  DIET DYS 2 Room service appropriate?: Yes; Fluid consistency:: Thin   Energy Intake: pt ate 70% at dinner last night, 50% at lunch yesterday; taking some Glucerna as well  Skin:   (stage II buttock)  Electrolyte and Renal Profile:  Recent Labs Lab 12/20/14 0351 12/21/14 0523 12/22/14 0439 12/23/14 0513 12/25/14 0459  BUN 20 24* 25* 27* 26*  CREATININE 0.78 0.75 0.81 0.94 0.85  NA 139 140 140 143 141  K 3.7 3.4* 4.5 4.3 4.1  MG 1.7 1.6* 1.9  --   --   PHOS 2.4* 2.6 3.0  --   --    Glucose Profile:  Recent Labs  12/25/14 0401 12/25/14 0811 12/25/14 1156  GLUCAP 117* 110* 122*   Meds: lasix, ss novolog  Height:   Ht Readings from Last 1 Encounters:  12/13/14  (1.702 m)    Weight:   Wt Readings from Last 1 Encounters:  12/25/14 226 lb 13.7 oz (102.9 kg)    Filed Weights   12/23/14 1400 12/24/14 0500 12/25/14 0600  Weight: 233 lb 0.4 oz (105.7 kg) 229 lb 0.9 oz (103.9 kg) 226 lb 13.7 oz (102.9 kg)    BMI:  Body mass index is 35.52 kg/(m^2).  Estimated Nutritional Needs:   Kcal:  5284-1324 kcals (11-14 kcals/kg)   Protein:  122-153 g (2.0-2.5 g/kg) using IBW 61 kg  Fluid:  1525-1830 mL (25-30 ml/kg)   MODERATE Care Level  Romelle Starcher MS, RD, LDN [phone] Pager

## 2014-12-25 NOTE — Progress Notes (Signed)
Patient atrial fib on cardiac monitor. Tolerated 28% on trach collar. Tolerated meals. Foley catheter with adequate UOP post. Left UE PICC, NS KVO. Son at bedside this evening, updated on patient status.

## 2014-12-25 NOTE — Progress Notes (Signed)
[facility] INFECTIOUS DISEASE PROGRESS NOTE Date of Admission:  12/13/2014     ID: Rachel Brady is a 69 y.o. female with ESBL UTI, Psuedomonal PNA  Active Problems:   Sepsis   Respiratory distress   Pressure ulcer   Subjective: Still having resp issues, was back on vent but now back off 24 hours. Still with thick secretions   ROS  Eleven systems are reviewed and negative except per hpi  Medications:  Antibiotics Given (last 72 hours)    Date/Time Action Medication Dose Rate   12/22/14 2105 Given   meropenem (MERREM) 1 g in sodium chloride 0.9 % 100 mL IVPB 1 g 200 mL/hr   12/23/14 0521 Given   meropenem (MERREM) 1 g in sodium chloride 0.9 % 100 mL IVPB 1 g 200 mL/hr   12/23/14 1540 Given   meropenem (MERREM) 1 g in sodium chloride 0.9 % 100 mL IVPB 1 g 200 mL/hr   12/23/14 2129 Given   meropenem (MERREM) 1 g in sodium chloride 0.9 % 100 mL IVPB 1 g 200 mL/hr   12/24/14 0602 Given   meropenem (MERREM) 1 g in sodium chloride 0.9 % 100 mL IVPB 1 g 200 mL/hr   12/24/14 1500 Given   meropenem (MERREM) 1 g in sodium chloride 0.9 % 100 mL IVPB 1 g 200 mL/hr   12/24/14 2238 Given   meropenem (MERREM) 1 g in sodium chloride 0.9 % 100 mL IVPB 1 g 200 mL/hr   12/25/14 0539 Given   meropenem (MERREM) 1 g in sodium chloride 0.9 % 100 mL IVPB 1 g 200 mL/hr     . apixaban  5 mg Oral BID  . atorvastatin  80 mg Oral q1800  . escitalopram  5 mg Oral Daily  . feeding supplement (GLUCERNA SHAKE)  237 mL Oral TID WC  . furosemide  40 mg Oral Daily  . insulin aspart  0-5 Units Subcutaneous QHS  . insulin aspart  0-9 Units Subcutaneous TID WC  . ipratropium-albuterol  3 mL Nebulization Q6H  . meropenem (MERREM) IV  1 g Intravenous 3 times per day  . metoprolol  2.5 mg Intravenous 4 times per day  . pantoprazole  40 mg Oral Daily  . QUEtiapine  50 mg Oral QHS  . senna-docusate  1 tablet Oral BID    Objective: Vital signs in last 24 hours: Temp:  [98.1 F (36.7 C)-99.5 F (37.5  C)] 98.2 F (36.8 C) (09/16 0900) Pulse Rate:  [65-113] 77 (09/16 0700) Resp:  [12-27] 18 (09/16 1300) BP: (65-135)/(28-101) 125/65 mmHg (09/16 1300) SpO2:  [83 %-100 %] 100 % (09/16 0743) FiO2 (%):  [28 %] 28 % (09/16 0340) Weight:  [102.9 kg (226 lb 13.7 oz)] 102.9 kg (226 lb 13.7 oz) (09/16 0600) Constitutional: Trached, sedated  HENT: [place]/AT, PERRLA, no scleral icterus Mouth/Throat: Oropharynx is clear and dry . No oropharyngeal exudate.  Cardiovascular: Normal rate, regular rhythm and normal heart sounds. Exam reveals no gallop and no friction rub.  Pulmonary/Chest: rhonchi bilateralluy Neck = supple, no nuchal rigidity Abdominal: Soft. Bowel sounds are normal. exhibits no distension. There is no tenderness.  Lymphadenopathy: no cervical adenopathy. No axillary adenopathy Neurological: alert and oriented to person, place, and time.  Skin: Skin is warm and dry. No rash noted. No erythema.  Psychiatric: a normal mood and affect. behavior is normal.  Foley cath  Lab Results  Recent Labs  12/23/14 0513 12/25/14 0459  WBC 4.3 4.5  HGB 8.8* 9.3*  HCT 27.7* 28.8*  NA 143 141  K 4.3 4.1  CL 106 103  CO2 31 33*  BUN 27* 26*  CREATININE 0.94 0.85    Microbiology: Results for orders placed or performed during the hospital encounter of 12/13/14  Blood Culture (routine x 2)     Status: None   Collection Time: 12/13/14  9:25 AM  Result Value Ref Range Status   Specimen Description BLOOD LEFT ARM  Final   Special Requests BOTTLES DRAWN AEROBIC AND ANAEROBIC  Final   Culture  Setup Time   Final    GRAM POSITIVE COCCI IN CLUSTERS ANAEROBIC BOTTLE ONLY CRITICAL RESULT CALLED TO, READ BACK BY AND VERIFIED WITH: LESLIE LEWIS 12/14/2014 0630 [facility] CONFIRMED BY [facility]    Culture   Final    COAGULASE NEGATIVE STAPHYLOCOCCUS ANAEROBIC BOTTLE ONLY Results consistent with contamination.    Report Status 12/18/2014 FINAL  Final  Blood Culture (routine x 2)     Status: None    Collection Time: 12/13/14  9:35 AM  Result Value Ref Range Status   Specimen Description BLOOD RIGHT FORARM  Final   Special Requests BOTTLES DRAWN AEROBIC AND ANAEROBIC  Final   Culture NO GROWTH 5 DAYS  Final   Report Status 12/18/2014 FINAL  Final  Urine culture     Status: None   Collection Time: 12/13/14  9:35 AM  Result Value Ref Range Status   Specimen Description URINE, RANDOM  Final   Special Requests NONE  Final   Culture   Final    >=100,000 COLONIES/mL ESCHERICHIA COLI ESBL-EXTENDED SPECTRUM BETA LACTAMASE-THE ORGANISM IS RESISTANT TO PENICILLINS, CEPHALOSPORINS AND AZTREONAM ACCORDING TO CLSI M100-S15 VOL.25 N01 JAN 2005. Results Called to: Derald Macleod AT 1119 12/15/14 CTJ    Report Status 12/15/2014 FINAL  Final   Organism ID, Bacteria ESCHERICHIA COLI  Final      Susceptibility   Escherichia coli - MIC*    AMPICILLIN >=32 RESISTANT Resistant     CEFTAZIDIME 16 RESISTANT Resistant     CEFAZOLIN >=64 RESISTANT Resistant     CEFTRIAXONE 32 RESISTANT Resistant     GENTAMICIN <=1 SENSITIVE Sensitive     IMIPENEM <=0.25 SENSITIVE Sensitive     TRIMETH/SULFA >=320 RESISTANT Resistant     Extended ESBL POSITIVE Resistant     CIPROFLOXACIN Value in next row Sensitive      SENSITIVE<=0.25    NITROFURANTOIN Value in next row Sensitive      SENSITIVE<=16    PIP/TAZO Value in next row Resistant      RESISTANT>=128    ERTAPENEM Value in next row Sensitive      SENSITIVE<=0.5    * >=100,000 COLONIES/mL ESCHERICHIA COLI  MRSA PCR Screening     Status: Abnormal   Collection Time: 12/13/14 12:20 PM  Result Value Ref Range Status   MRSA by PCR POSITIVE (A) NEGATIVE Final    Comment:        The GeneXpert MRSA Assay (FDA approved for NASAL specimens only), is one component of a comprehensive MRSA colonization surveillance program. It is not intended to diagnose MRSA infection nor to guide or monitor treatment for MRSA infections. CRITICAL RESULT CALLED TO,  READ BACK BY AND VERIFIED WITH: ADAM SCARBORO @1338  ON 12/13/14 BY HP   Culture, expectorated sputum-assessment     Status: None   Collection Time: 12/15/14 10:49 AM  Result Value Ref Range Status   Specimen Description ENDOTRACHEAL  Final   Special Requests NONE  Final  Sputum evaluation THIS SPECIMEN IS ACCEPTABLE FOR SPUTUM CULTURE  Final   Report Status 12/16/2014 FINAL  Final  Culture, respiratory (NON-Expectorated)     Status: None   Collection Time: 12/15/14 10:49 AM  Result Value Ref Range Status   Specimen Description ENDOTRACHEAL  Final   Special Requests NONE Reflexed from [id number]  Final   Gram Stain   Final    FEW WBC SEEN FEW GRAM NEGATIVE RODS FEW GRAM POSITIVE COCCI IN PAIRS FAIR SPECIMEN - 70-80% WBCS    Culture   Final    MODERATE GROWTH CITROBACTER SPECIES HEAVY GROWTH PSEUDOMONAS AERUGINOSA MULTI-DRUG RESISTANT ORGANISM CRITICAL RESULT CALLED TO, READ BACK BY AND VERIFIED WITH: [facility] BORBA 12/18/14 1042 JGF    Report Status 12/19/2014 FINAL  Final   Organism ID, Bacteria PSEUDOMONAS AERUGINOSA  Final   Organism ID, Bacteria CITROBACTER SPECIES  Final      Susceptibility   Pseudomonas aeruginosa - MIC*    CEFTAZIDIME >=64 RESISTANT Resistant     CIPROFLOXACIN >=4 RESISTANT Resistant     GENTAMICIN >=16 RESISTANT Resistant     IMIPENEM 1 SENSITIVE Sensitive     PIP/TAZO Value in next row Resistant      RESISTANT>=128    * HEAVY GROWTH PSEUDOMONAS AERUGINOSA   Citrobacter species - MIC*    PIP/TAZO Value in next row Resistant      RESISTANT>=128    CEFTAZIDIME Value in next row Sensitive      SENSITIVE<=4    CEFEPIME Value in next row Sensitive      SENSITIVE<=1    CEFAZOLIN Value in next row Resistant      RESISTANT>=64    CEFTRIAXONE Value in next row Sensitive      SENSITIVE8    CIPROFLOXACIN Value in next row Sensitive      SENSITIVE<=0.25    GENTAMICIN Value in next row Sensitive      SENSITIVE<=1    IMIPENEM Value in next row Sensitive       SENSITIVE<=0.25    TRIMETH/SULFA Value in next row Sensitive      SENSITIVE<=20    * MODERATE GROWTH CITROBACTER SPECIES  Culture, respiratory (NON-Expectorated)     Status: None   Collection Time: 12/23/14 12:00 PM  Result Value Ref Range Status   Specimen Description SPUTUM  Final   Special Requests NONE  Final   Gram Stain   Final    MODERATE WBC SEEN FEW GRAM NEGATIVE RODS EXCELLENT SPECIMEN - 90-100% WBCS    Culture   Final    HEAVY GROWTH PSEUDOMONAS AERUGINOSA This organism isolate is resistant to one or more antiotic agents in three or more antimicrobial categories.  Suggest Infectious Disease consult.   CRITICAL RESULT CALLED TO, READ BACK BY AND VERIFIED WITH: PAM CRAWFORD AT 1047 12/25/14 DV    Report Status 12/25/2014 FINAL  Final   Organism ID, Bacteria PSEUDOMONAS AERUGINOSA  Final      Susceptibility   Pseudomonas aeruginosa - MIC*    CEFTAZIDIME 16 INTERMEDIATE Intermediate     CIPROFLOXACIN >=4 RESISTANT Resistant     GENTAMICIN >=16 RESISTANT Resistant     IMIPENEM >=16 RESISTANT Resistant     PIP/TAZO Value in next row Sensitive      SENSITIVE32    * HEAVY GROWTH PSEUDOMONAS AERUGINOSA    Studies/Results: Dg Chest Port 1 View  12/25/2014   CLINICAL DATA:  Respiratory failure.  EXAM: PORTABLE CHEST - 1 VIEW  COMPARISON:  12/23/2014 .  FINDINGS: Tracheostomy tube and  left PICC line in stable position. Prior CABG and cardiac valve replacement. Persistent severe cardiomegaly and prominent bilateral pleural effusions. Underlying bibasilar pulmonary infiltrates/edema cannot be excluded. Low lung volumes. No pneumothorax.  IMPRESSION: 1. Lines and tubes in stable position. 2. Prior CABG and cardiac valve replacement. Persistent severe cardiomegaly. Persistent bilateral prominent pleural effusions. Underlying bibasilar infiltrates/pulmonary edema cannot be excluded. Low lung volumes.   Electronically Signed   By: Maisie Fus  Register   On: 12/25/2014 07:21     Assessment/Plan: Rachel Brady is a 69 y.o. female admitted with sepsis from ESBL E colu UTI, PNA and respiratory arrest. Bcx 1/2 with CNS likely contaminant. Fever curve improving, wbc nml.  Sutum with MDR Psuedomonas and citrobacter.  Recommendations Continue meropenem - 14 day course from initiation of appropriate coverage for the pseudomonas (cannot use ertapenem as poor Pseudomonas activity) Stop date 01/01/15 WIll need continue aggressive pulmonary toilet as well FU sputum again grew psuedomonas that was resistant to imipimen but sens to zosyn (opposite of initial isolate) It is R to gent and intermediate to tobramycin - If develops fevers, leukocytosis would add inhaled tobra in addition to the meropenem  FITZGERALD, DAVID   12/25/2014, 2:14 PM

## 2014-12-25 NOTE — Progress Notes (Signed)
ANTIBIOTIC CONSULT NOTE - FOLLOW UP   Pharmacy Consult for Meropenm Indication: ESBL UTI/Pseudomonas PNA  Allergies  Allergen Reactions  . Codeine     Has tolerated oxycodone in the past.    Patient Measurements: Height: 5\' 7"  (170.2 cm) Weight: 226 lb 13.7 oz (102.9 kg) IBW/kg (Calculated) : 61.6   Vital Signs: Temp: 98.2 F (36.8 C) (09/16 0900) BP: 125/65 mmHg (09/16 1300) Pulse Rate: 77 (09/16 0700) Intake/Output from previous day: 09/15 0701 - 09/16 0700 In: 660 [P.O.:660] Out: 900 [Urine:900] Intake/Output from this shift: Total I/O In: 200 [P.O.:200] Out: 300 [Urine:300]  Labs:  Recent Labs  12/23/14 0513 12/25/14 0459  WBC 4.3 4.5  HGB 8.8* 9.3*  PLT 133* 157  CREATININE 0.94 0.85   Estimated Creatinine Clearance: 78.1 mL/min (by C-G formula based on Cr of 0.85). No results for input(s): VANCOTROUGH, VANCOPEAK, VANCORANDOM, GENTTROUGH, GENTPEAK, GENTRANDOM, TOBRATROUGH, TOBRAPEAK, TOBRARND, AMIKACINPEAK, AMIKACINTROU, AMIKACIN in the last 72 hours.   Microbiology: Recent Results (from the past 720 hour(s))  Blood Culture (routine x 2)     Status: None   Collection Time: 12/13/14  9:25 AM  Result Value Ref Range Status   Specimen Description BLOOD LEFT ARM  Final   Special Requests BOTTLES DRAWN AEROBIC AND ANAEROBIC  Final   Culture  Setup Time   Final    GRAM POSITIVE COCCI IN CLUSTERS ANAEROBIC BOTTLE ONLY CRITICAL RESULT CALLED TO, READ BACK BY AND VERIFIED WITH: LESLIE LEWIS 12/14/2014 0630 [facility] CONFIRMED BY [facility]    Culture   Final    COAGULASE NEGATIVE STAPHYLOCOCCUS ANAEROBIC BOTTLE ONLY Results consistent with contamination.    Report Status 12/18/2014 FINAL  Final  Blood Culture (routine x 2)     Status: None   Collection Time: 12/13/14  9:35 AM  Result Value Ref Range Status   Specimen Description BLOOD RIGHT FORARM  Final   Special Requests BOTTLES DRAWN AEROBIC AND ANAEROBIC  Final   Culture NO GROWTH 5 DAYS  Final    Report Status 12/18/2014 FINAL  Final  Urine culture     Status: None   Collection Time: 12/13/14  9:35 AM  Result Value Ref Range Status   Specimen Description URINE, RANDOM  Final   Special Requests NONE  Final   Culture   Final    >=100,000 COLONIES/mL ESCHERICHIA COLI ESBL-EXTENDED SPECTRUM BETA LACTAMASE-THE ORGANISM IS RESISTANT TO PENICILLINS, CEPHALOSPORINS AND AZTREONAM ACCORDING TO CLSI M100-S15 VOL.25 N01 JAN 2005. Results Called to: Derald Macleod AT 1119 12/15/14 CTJ    Report Status 12/15/2014 FINAL  Final   Organism ID, Bacteria ESCHERICHIA COLI  Final      Susceptibility   Escherichia coli - MIC*    AMPICILLIN >=32 RESISTANT Resistant     CEFTAZIDIME 16 RESISTANT Resistant     CEFAZOLIN >=64 RESISTANT Resistant     CEFTRIAXONE 32 RESISTANT Resistant     GENTAMICIN <=1 SENSITIVE Sensitive     IMIPENEM <=0.25 SENSITIVE Sensitive     TRIMETH/SULFA >=320 RESISTANT Resistant     Extended ESBL POSITIVE Resistant     CIPROFLOXACIN Value in next row Sensitive      SENSITIVE<=0.25    NITROFURANTOIN Value in next row Sensitive      SENSITIVE<=16    PIP/TAZO Value in next row Resistant      RESISTANT>=128    ERTAPENEM Value in next row Sensitive      SENSITIVE<=0.5    * >=100,000 COLONIES/mL ESCHERICHIA COLI  MRSA PCR Screening  Status: Abnormal   Collection Time: 12/13/14 12:20 PM  Result Value Ref Range Status   MRSA by PCR POSITIVE (A) NEGATIVE Final    Comment:        The GeneXpert MRSA Assay (FDA approved for NASAL specimens only), is one component of a comprehensive MRSA colonization surveillance program. It is not intended to diagnose MRSA infection nor to guide or monitor treatment for MRSA infections. CRITICAL RESULT CALLED TO, READ BACK BY AND VERIFIED WITH: ADAM SCARBORO  ON 12/13/14 BY HP   Culture, expectorated sputum-assessment     Status: None   Collection Time: 12/15/14 10:49 AM  Result Value Ref Range Status   Specimen Description  ENDOTRACHEAL  Final   Special Requests NONE  Final   Sputum evaluation THIS SPECIMEN IS ACCEPTABLE FOR SPUTUM CULTURE  Final   Report Status 12/16/2014 FINAL  Final  Culture, respiratory (NON-Expectorated)     Status: None   Collection Time: 12/15/14 10:49 AM  Result Value Ref Range Status   Specimen Description ENDOTRACHEAL  Final   Special Requests NONE Reflexed from [id number]  Final   Gram Stain   Final    FEW WBC SEEN FEW GRAM NEGATIVE RODS FEW GRAM POSITIVE COCCI IN PAIRS FAIR SPECIMEN - 70-80% WBCS    Culture   Final    MODERATE GROWTH CITROBACTER SPECIES HEAVY GROWTH PSEUDOMONAS AERUGINOSA MULTI-DRUG RESISTANT ORGANISM CRITICAL RESULT CALLED TO, READ BACK BY AND VERIFIED WITH: [facility] BORBA 12/18/14 1042 JGF    Report Status 12/19/2014 FINAL  Final   Organism ID, Bacteria PSEUDOMONAS AERUGINOSA  Final   Organism ID, Bacteria CITROBACTER SPECIES  Final      Susceptibility   Pseudomonas aeruginosa - MIC*    CEFTAZIDIME >=64 RESISTANT Resistant     CIPROFLOXACIN >=4 RESISTANT Resistant     GENTAMICIN >=16 RESISTANT Resistant     IMIPENEM 1 SENSITIVE Sensitive     PIP/TAZO Value in next row Resistant      RESISTANT>=128    * HEAVY GROWTH PSEUDOMONAS AERUGINOSA   Citrobacter species - MIC*    PIP/TAZO Value in next row Resistant      RESISTANT>=128    CEFTAZIDIME Value in next row Sensitive      SENSITIVE<=4    CEFEPIME Value in next row Sensitive      SENSITIVE<=1    CEFAZOLIN Value in next row Resistant      RESISTANT>=64    CEFTRIAXONE Value in next row Sensitive      SENSITIVE8    CIPROFLOXACIN Value in next row Sensitive      SENSITIVE<=0.25    GENTAMICIN Value in next row Sensitive      SENSITIVE<=1    IMIPENEM Value in next row Sensitive      SENSITIVE<=0.25    TRIMETH/SULFA Value in next row Sensitive      SENSITIVE<=20    * MODERATE GROWTH CITROBACTER SPECIES  Culture, respiratory (NON-Expectorated)     Status: None   Collection Time: 12/23/14 12:00 PM   Result Value Ref Range Status   Specimen Description SPUTUM  Final   Special Requests NONE  Final   Gram Stain   Final    MODERATE WBC SEEN FEW GRAM NEGATIVE RODS EXCELLENT SPECIMEN - 90-100% WBCS    Culture   Final    HEAVY GROWTH PSEUDOMONAS AERUGINOSA This organism isolate is resistant to one or more antiotic agents in three or more antimicrobial categories.  Suggest Infectious Disease consult.   CRITICAL RESULT CALLED TO, READ BACK  BY AND VERIFIED WITH: PAM CRAWFORD AT 1047 12/25/14 DV    Report Status 12/25/2014 FINAL  Final   Organism ID, Bacteria PSEUDOMONAS AERUGINOSA  Final      Susceptibility   Pseudomonas aeruginosa - MIC*    CEFTAZIDIME 16 INTERMEDIATE Intermediate     CIPROFLOXACIN >=4 RESISTANT Resistant     GENTAMICIN >=16 RESISTANT Resistant     IMIPENEM >=16 RESISTANT Resistant     PIP/TAZO Value in next row Sensitive      SENSITIVE32    * HEAVY GROWTH PSEUDOMONAS AERUGINOSA    Medical History: Past Medical History  Diagnosis Date  . Tracheostomy in place   . Diabetes   . CKD (chronic kidney disease)   . OSA (obstructive sleep apnea)   . HTN (hypertension)   . MDD (major depressive disorder)     Medications:  Scheduled:  . apixaban  5 mg Oral BID  . atorvastatin  80 mg Oral q1800  . escitalopram  5 mg Oral Daily  . feeding supplement (GLUCERNA SHAKE)  237 mL Oral TID WC  . furosemide  40 mg Oral Daily  . insulin aspart  0-5 Units Subcutaneous QHS  . insulin aspart  0-9 Units Subcutaneous TID WC  . ipratropium-albuterol  3 mL Nebulization Q6H  . meropenem (MERREM) IV  1 g Intravenous 3 times per day  . metoprolol  2.5 mg Intravenous 4 times per day  . pantoprazole  40 mg Oral Daily  . QUEtiapine  50 mg Oral QHS  . senna-docusate  1 tablet Oral BID   Infusions:   PRN: acetaminophen **OR** acetaminophen, ALPRAZolam, alum & mag hydroxide-simeth, hydrALAZINE, HYDROcodone-acetaminophen, Influenza vac split quadrivalent PF, morphine injection,  ondansetron **OR** ondansetron (ZOFRAN) IV, pneumococcal 23 valent vaccine  Assessment: 69 y/o F with acute respiratory failure on ciprofloxacin for ESBL E coli UTI. Tracheal aspirate culture growing Pseudomonas resistant to all but imipenem as well as Citrobacter. Patient is currently on day 7 of meropenem and will need to continue therapy through 09/23 per ID.   Plan:  Will continue Merrem 1 g IV q8 hours.   Luisa Hart D 12/25/2014,3:24 PM

## 2014-12-25 NOTE — Progress Notes (Signed)
Speech Language Pathology Treatment: Dysphagia  Patient Details Name: Rachel Brady MRN: [id number] DOB: 14-Oct-1945 Today's Date: 12/25/2014 Time: 4098-1191 SLP Time Calculation (min) (ACUTE ONLY): 60 min  Assessment / Plan / Recommendation Clinical Impression  Pt appears to be tolerating her current rec'd diet of Dys. 2 w/ thin liquids when following strict aspiration precautions; must wear the PMV w/ all oral intake. Pt does exhibit fatigue easily w/ the increased exertion of tasks but taking rest breaks during po trials/intake appeared to allow her to calm her breathing. Pt continues to receive sedating medications which impact her performance during the day(more fatigued). Pt is at increased risk for aspiration at this time and would benefit from a continued modified diet of Dys. 2 w/ thin liquids; PMV placed for all oral intake. Rec. meds crushed in puree for easier swallowing. Rec. ST services continue to f/u for toleration of diet and education on precautions for pt and staff/caregivers. NSG updated.    HPI Other Pertinent Information: Pt is a 69 y.o. female with a known history of tracheostomy(also current), OSA, MDD, GERD, atrial fibrillation and type 2 diabetes who presents from nursing home with altered mental status and apnea. Patient was apparently seen approximately 1:00 this morning in her usual state of health. This morning when the staff tried to wake her she was unarousable and very lethargic. EMS was then called by the staff. On transportation to the ER it was noted the patient had episodes of apnea. Patient was bagged and started breathing spontaneously. She is currently intubated. She was also found to be hypotensive and hypothermic. Sepsis protocol was ordered. Patient was started on Zosyn and vancomycin. Patient currently has a bear hugger in place. Patient had a urine analysis which shows a urinary tract infection. Pt typically has a cuffless trach in place and uses a speaking  valve per her report. She eats a regular diet per report. At admission, pt was placed on vent support briefly overnight then again a few days later over this past weekend. She also received a dobhoff for NG TFs. Pt has had trouble weaning off vent support to trach collar, but has been on trach collar O2 support for 24 hours now per report. Pt tolerated trach collar status w/ PMV placement and eating her po diet well yesterday per NSG. Pt received meds to calm and slept through the morning. She is now more awake and wanting to eat lunch. PMV in place.    Pertinent Vitals Pain Assessment: No/denies pain  SLP Plan  Continue with current plan of care    Recommendations Diet recommendations: Dysphagia 2 (fine chop);Thin liquid Liquids provided via: Cup;Straw Medication Administration: Whole meds with puree Supervision: Staff to assist with self feeding;Full supervision/cueing for compensatory strategies Compensations: Minimize environmental distractions;Slow rate;Small sips/bites (MUST wear PMV for all oral intake) Postural Changes and/or Swallow Maneuvers: Seated upright 90 degrees      Patient may use Passy-Muir Speech Valve: During PO intake/meals;During all waking hours (remove during sleep) PMSV Supervision: Full       Oral Care Recommendations: Oral care BID;Staff/trained caregiver to provide oral care Follow up Recommendations: Skilled Nursing facility Plan: Continue with current plan of care    GO    Jerilynn Som, MS, CCC-SLP  [name],[name] 12/25/2014, 1:57 PM

## 2014-12-25 NOTE — Care Management Note (Addendum)
Case Management Note  Patient Details  Name: SERENAH MILL MRN: [id number] Date of Birth: Feb 04, 1946  Subjective/Objective:   Dr. Sung Amabile has recommended that patient be placed in a SNF that can support ventilator needs. It is anticipated that pt will need intermittent ventilator usage.   CSW updated.   Request Dr. Sung Amabile speak with son regarding long term needs and then CSW will follow up.           Action/Plan:   Expected Discharge Date:                  Expected Discharge Plan:  Skilled Nursing Facility  In-House Referral:  Clinical Social Work  Discharge planning Services  CM Consult  Post Acute Care Choice:  Long Term Acute Care (LTAC) Choice offered to:  Adult Children  DME Arranged:    DME Agency:     HH Arranged:    HH Agency:     Status of Service:  In process, will continue to follow  Medicare Important Message Given:  Yes-fourth notification given Date Medicare IM Given:    Medicare IM give by:    Date Additional Medicare IM Given:    Additional Medicare Important Message give by:     If discussed at Long Length of Stay Meetings, dates discussed:    Additional Comments:  Marily Memos, RN 12/25/2014, 1:00 PM

## 2014-12-25 NOTE — Clinical Social Work Note (Signed)
Informed by RN CM in ICU that the ICU intensivist is recommending the possibility of a vent snf for patient as it is not appearing that patient will totally be able to be weaned off of the vent. RN CM confirmed that the MD has not yet discussed his recommendation with the family. CSW to continue to follow and if patient continues requiring the ventilator at night, will speak with son to request possible referral to [facility] in [place]. CSW spoke with Tammy at [facility] today and she stated that at minimum a patient would have to require the vent at night to qualify for their facility. York Spaniel MSW,LCSW [phone]

## 2014-12-25 NOTE — Progress Notes (Signed)
[facility]icians - [place] at [facility]   PATIENT NAME: Rachel Brady    MR#:  [id number]  DATE OF BIRTH:  08/31/1945  SUBJECTIVE:  CHIEF COMPLAINT:   Chief Complaint  Patient presents with  . Respiratory Arrest   patient has been on trach collar for the last 24 hours    REVIEW OF SYSTEMS:  Review of Systems  Unable to perform ROS  DRUG ALLERGIES:   Allergies  Allergen Reactions  . Codeine     Has tolerated oxycodone in the past.   VITALS:  Blood pressure 125/65, pulse 77, temperature 98.2 F (36.8 C), temperature source Core (Comment), resp. rate 18, height  (1.702 m), weight 102.9 kg (226 lb 13.7 oz), SpO2 100 %. PHYSICAL EXAMINATION:  Physical Exam  Constitutional: She is well-developed, well-nourished, and in no distress.  HENT:  Head: Normocephalic and atraumatic.  Trach in place  Eyes: Conjunctivae are normal. Pupils are equal, round, and reactive to light.  Neck: Normal range of motion. Neck supple. No tracheal deviation present. No thyromegaly present.  Cardiovascular: Normal rate, regular rhythm and normal heart sounds.   Pulmonary/Chest: Effort normal and breath sounds normal. No respiratory distress. She has no wheezes. She exhibits no tenderness.  Abdominal: Soft. Bowel sounds are normal. She exhibits no distension. There is no tenderness.  Musculoskeletal: Normal range of motion.  Neurological: No cranial nerve deficit.  Skin: Skin is warm and dry. No rash noted.  Psychiatric: Mood and affect normal.   LABORATORY PANEL:   CBC  Recent Labs Lab 12/25/14 0459  WBC 4.5  HGB 9.3*  HCT 28.8*  PLT 157   ------------------------------------------------------------------------------------------------------------------ Chemistries   Recent Labs Lab 12/22/14 0439  12/25/14 0459  NA 140  < > 141  K 4.5  < > 4.1  CL 105  < > 103  CO2 31  < > 33*  GLUCOSE 117*  < > 129*  BUN 25*  < > 26*  CREATININE 0.81  < > 0.85   CALCIUM 11.0*  < > 11.6*  MG 1.9  --   --   < > = values in this interval not displayed. RADIOLOGY:  Dg Chest Port 1 View  12/25/2014   CLINICAL DATA:  Respiratory failure.  EXAM: PORTABLE CHEST - 1 VIEW  COMPARISON:  12/23/2014 .  FINDINGS: Tracheostomy tube and left PICC line in stable position. Prior CABG and cardiac valve replacement. Persistent severe cardiomegaly and prominent bilateral pleural effusions. Underlying bibasilar pulmonary infiltrates/edema cannot be excluded. Low lung volumes. No pneumothorax.  IMPRESSION: 1. Lines and tubes in stable position. 2. Prior CABG and cardiac valve replacement. Persistent severe cardiomegaly. Persistent bilateral prominent pleural effusions. Underlying bibasilar infiltrates/pulmonary edema cannot be excluded. Low lung volumes.   Electronically Signed   By: Maisie Fus  Register   On: 12/25/2014 07:21   ASSESSMENT AND PLAN:  This is a 69 year old female with a history of tracheostomy, atrial fibrillation, diabetes who presents with sepsis.  1. Sepsis with hypotension: secondary to urinary tract infection with ESBL ecoli Hypotension resolved  Appreciate ID input. Abx changed to Meropenem day #8/14 Continue meropenem for a total of 14 days until 01/01/2015 as per ID recommendations. Status post PICC line 9/10 Patient also has Pseudomonas and MRSA from her trach secretions but this is likely chronic colonization  2. ESBL E.coli Urinary tract infection: Meropenem as above    3. Healthcare acquired pneumonia:  Continue meropenem as trach suction sputum growing Pseudomonas & Citrobacter and MRSA  4. Acute respiratory failure: Trach collar per pulmonary recommends being in a place where they also can provide ventilator at nighttime  5. Atrial fibrillation: Her heart rate is well controlled at this time  6. Diabetes:   Sliding scale insulin blood glucose oral stable   7. Depression/anxiety: Anxiolytics as needed basis  8. Fungal skin infection:  continue nystatin.  9. Nutrition- oral intake  10 . Deconditioning with PT as tolerated  Disposition-[facility] /snf.  CODE STATUS: Full code  TOTAL TIME  TAKING CARE OF THIS PATIENT: 35 minutes.     Auburn Bilberry M.D on 12/25/2014 at 1:56 PM  Between 7am to 6pm - Pager - [phone]  After 6pm go to [email or web address] - password [email or web address] [facility]  [place] [facility]  Office  [phone]  CC: Primary care physician; Dorothey Baseman, MD

## 2014-12-26 LAB — BASIC METABOLIC PANEL
ANION GAP: 4 — AB (ref 5–15)
BUN: 23 mg/dL — ABNORMAL HIGH (ref 6–20)
CALCIUM: 11.4 mg/dL — AB (ref 8.9–10.3)
CO2: 34 mmol/L — ABNORMAL HIGH (ref 22–32)
Chloride: 105 mmol/L (ref 101–111)
Creatinine, Ser: 0.81 mg/dL (ref 0.44–1.00)
GFR calc Af Amer: >60 mL/min (ref >60)
GLUCOSE: 109 mg/dL — AB (ref 65–99)
POTASSIUM: 3.8 mmol/L (ref 3.5–5.1)
SODIUM: 143 mmol/L (ref 135–145)

## 2014-12-26 LAB — GLUCOSE, CAPILLARY
GLUCOSE-CAPILLARY: 100 mg/dL — AB (ref 65–99)
GLUCOSE-CAPILLARY: 134 mg/dL — AB (ref 65–99)
GLUCOSE-CAPILLARY: 168 mg/dL — AB (ref 65–99)
Glucose-Capillary: 99 mg/dL (ref 65–99)
Glucose-Capillary: 150 mg/dL — ABNORMAL HIGH (ref 65–99)
Glucose-Capillary: 133 mg/dL — ABNORMAL HIGH (ref 65–99)

## 2014-12-26 MED ORDER — METOPROLOL TARTRATE 25 MG PO TABS
12.5000 mg | ORAL_TABLET | Freq: Three times a day (TID) | ORAL | Status: DC
  Administered 2014-12-26 – 2014-12-27 (×3): 12.5 mg via ORAL
  Filled 2014-12-26 (×3): qty 1

## 2014-12-26 NOTE — Progress Notes (Signed)
Focused assessment completed. Verified with Greer Ee. Trac emergency supplies at beside. RT verified correct oxygen delivery and trach status. Pt stable.

## 2014-12-26 NOTE — Progress Notes (Signed)
Called report to [employer] on 2A. Patient to move to room 249. Son to be notified.

## 2014-12-26 NOTE — Progress Notes (Signed)
[facility] Physicians - [place] at [facility]   PATIENT NAME: Rachel Brady    MR#:  [id number]  DATE OF BIRTH:  10-Feb-1946  SUBJECTIVE:    patient has been on trach collar for the last 48 hours    REVIEW OF SYSTEMS:  Review of Systems  Unable to perform ROS  DRUG ALLERGIES:   Allergies  Allergen Reactions  . Codeine     Has tolerated oxycodone in the past.   VITALS:  Blood pressure 102/54, pulse 79, temperature 98.1 F (36.7 C), temperature source Axillary, resp. rate 23, height  (1.702 m), weight 103.6 kg (228 lb 6.3 oz), SpO2 95 %. PHYSICAL EXAMINATION:  Physical Exam  Constitutional: She is well-developed, well-nourished, and in no distress.appears chronically ill  HENT:  Head: Normocephalic and atraumatic.  Trach in place  Eyes: Conjunctivae are normal. Pupils are equal, round, and reactive to light.  Neck: Normal range of motion. Neck supple. No tracheal deviation present. No thyromegaly present.  Cardiovascular: Normal rate, regular rhythm and normal heart sounds.   Pulmonary/Chest: Effort normal and breath sounds normal. No respiratory distress. She has no wheezes. She exhibits no tenderness.  Abdominal: Soft. Bowel sounds are normal. She exhibits no distension. There is no tenderness.  Musculoskeletal: Normal range of motion.  Neurological: No cranial nerve deficit.  Skin: Skin is warm and dry. No rash noted.  Psychiatric: Mood and affect normal.   LABORATORY PANEL:   CBC  Recent Labs Lab 12/25/14 0459  WBC 4.5  HGB 9.3*  HCT 28.8*  PLT 157   ------------------------------------------------------------------------------------------------------------------ Chemistries   Recent Labs Lab 12/22/14 0439  12/26/14 0458  NA 140  < > 143  K 4.5  < > 3.8  CL 105  < > 105  CO2 31  < > 34*  GLUCOSE 117*  < > 109*  BUN 25*  < > 23*  CREATININE 0.81  < > 0.81  CALCIUM 11.0*  < > 11.4*  MG 1.9  --   --   < > = values in this  interval not displayed. RADIOLOGY:  No results found. ASSESSMENT AND PLAN:  This is a 69 year old female with a history of tracheostomy, atrial fibrillation, diabetes who presents with sepsis.  1. Sepsis with hypotension: secondary to urinary tract infection with ESBL ecoli Hypotension resolved  Appreciate ID input. Abx changed to Meropenem day #9/14 Continue meropenem for a total of 14 days until 01/01/2015 as per ID recommendations. 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 ESBL E.coli Urinary tract infection: Meropenem as above    3. Healthcare acquired pneumonia:  Continue meropenem as trach suction sputum growing Pseudomonas & Citrobacter and MRSA  4. Acute respiratory failure: Trach collar per pulmonary recommends being in a place where they also can provide ventilator at nighttime -for no pt doing well on trach collar only. Dr Belia Heman recommends keep pt over the weekend to ensure she is doing ok  5. Atrial fibrillation: Her heart rate is well controlled at this time  6. Diabetes:   Sliding scale insulin blood glucose oral stable   7. Depression/anxiety: Anxiolytics as needed basis  8. Fungal skin infection: continue nystatin.  9. Nutrition- oral intake  10 . Deconditioning with PT as tolerated  Transfer to 2A  Disposition-[facility] /snf.  CODE STATUS: Full code  TOTAL TIME  TAKING CARE OF THIS PATIENT: 35 minutes.  PATEL,SONA M.D on 12/26/2014 at 3:45 PM  Between 7am to 6pm - Pager - [phone]  After 6pm go to [email or web address] - password [email or web address] [facility]  [place] [facility]  Office  [phone]  CC: Primary care physician; Dorothey Baseman, MD

## 2014-12-26 NOTE — Progress Notes (Signed)
ANTIBIOTIC CONSULT NOTE - FOLLOW UP   Pharmacy Consult for Meropenm Indication: ESBL UTI/Pseudomonas PNA  Allergies  Allergen Reactions  . Codeine     Has tolerated oxycodone in the past.    Patient Measurements: Height: 5\' 7"  (170.2 cm) Weight: 228 lb 6.3 oz (103.6 kg) IBW/kg (Calculated) : 61.6   Vital Signs: Temp: 98.1 F (36.7 C) (09/17 0830) Temp Source: Axillary (09/17 0830) BP: 133/109 mmHg (09/17 1400) Pulse Rate: 90 (09/17 1400)  Labs:  Recent Labs  12/25/14 0459 12/26/14 0458  WBC 4.5  --   HGB 9.3*  --   PLT 157  --   CREATININE 0.85 0.81   Estimated Creatinine Clearance: 82.3 mL/min (by C-G formula based on Cr of 0.81).   Microbiology: Recent Results (from the past 720 hour(s))  Blood Culture (routine x 2)     Status: None   Collection Time: 12/13/14  9:25 AM  Result Value Ref Range Status   Specimen Description BLOOD LEFT ARM  Final   Special Requests BOTTLES DRAWN AEROBIC AND ANAEROBIC  Final   Culture  Setup Time   Final    GRAM POSITIVE COCCI IN CLUSTERS ANAEROBIC BOTTLE ONLY CRITICAL RESULT CALLED TO, READ BACK BY AND VERIFIED WITH: Charon Smedberg LEWIS 12/14/2014 0630 [facility] CONFIRMED BY [facility]    Culture   Final    COAGULASE NEGATIVE STAPHYLOCOCCUS ANAEROBIC BOTTLE ONLY Results consistent with contamination.    Report Status 12/18/2014 FINAL  Final  Blood Culture (routine x 2)     Status: None   Collection Time: 12/13/14  9:35 AM  Result Value Ref Range Status   Specimen Description BLOOD RIGHT FORARM  Final   Special Requests BOTTLES DRAWN AEROBIC AND ANAEROBIC  Final   Culture NO GROWTH 5 DAYS  Final   Report Status 12/18/2014 FINAL  Final  Urine culture     Status: None   Collection Time: 12/13/14  9:35 AM  Result Value Ref Range Status   Specimen Description URINE, RANDOM  Final   Special Requests NONE  Final   Culture   Final    >=100,000 COLONIES/mL ESCHERICHIA COLI ESBL-EXTENDED SPECTRUM BETA LACTAMASE-THE ORGANISM IS  RESISTANT TO PENICILLINS, CEPHALOSPORINS AND AZTREONAM ACCORDING TO CLSI M100-S15 VOL.25 N01 JAN 2005. Results Called to: Derald Macleod AT 1119 12/15/14 CTJ    Report Status 12/15/2014 FINAL  Final   Organism ID, Bacteria ESCHERICHIA COLI  Final      Susceptibility   Escherichia coli - MIC*    AMPICILLIN >=32 RESISTANT Resistant     CEFTAZIDIME 16 RESISTANT Resistant     CEFAZOLIN >=64 RESISTANT Resistant     CEFTRIAXONE 32 RESISTANT Resistant     GENTAMICIN <=1 SENSITIVE Sensitive     IMIPENEM <=0.25 SENSITIVE Sensitive     TRIMETH/SULFA >=320 RESISTANT Resistant     Extended ESBL POSITIVE Resistant     CIPROFLOXACIN Value in next row Sensitive      SENSITIVE<=0.25    NITROFURANTOIN Value in next row Sensitive      SENSITIVE<=16    PIP/TAZO Value in next row Resistant      RESISTANT>=128    ERTAPENEM Value in next row Sensitive      SENSITIVE<=0.5    * >=100,000 COLONIES/mL ESCHERICHIA COLI  MRSA PCR Screening     Status: Abnormal   Collection Time: 12/13/14 12:20 PM  Result Value Ref Range Status   MRSA by PCR POSITIVE (A) NEGATIVE Final    Comment:  The GeneXpert MRSA Assay (FDA approved for NASAL specimens only), is one component of a comprehensive MRSA colonization surveillance program. It is not intended to diagnose MRSA infection nor to guide or monitor treatment for MRSA infections. CRITICAL RESULT CALLED TO, READ BACK BY AND VERIFIED WITH: ADAM SCARBORO @1338  ON 12/13/14 BY HP   Culture, expectorated sputum-assessment     Status: None   Collection Time: 12/15/14 10:49 AM  Result Value Ref Range Status   Specimen Description ENDOTRACHEAL  Final   Special Requests NONE  Final   Sputum evaluation THIS SPECIMEN IS ACCEPTABLE FOR SPUTUM CULTURE  Final   Report Status 12/16/2014 FINAL  Final  Culture, respiratory (NON-Expectorated)     Status: None   Collection Time: 12/15/14 10:49 AM  Result Value Ref Range Status   Specimen Description ENDOTRACHEAL   Final   Special Requests NONE Reflexed from [id number]  Final   Gram Stain   Final    FEW WBC SEEN FEW GRAM NEGATIVE RODS FEW GRAM POSITIVE COCCI IN PAIRS FAIR SPECIMEN - 70-80% WBCS    Culture   Final    MODERATE GROWTH CITROBACTER SPECIES HEAVY GROWTH PSEUDOMONAS AERUGINOSA MULTI-DRUG RESISTANT ORGANISM CRITICAL RESULT CALLED TO, READ BACK BY AND VERIFIED WITH: [facility] BORBA 12/18/14 1042 JGF    Report Status 12/19/2014 FINAL  Final   Organism ID, Bacteria PSEUDOMONAS AERUGINOSA  Final   Organism ID, Bacteria CITROBACTER SPECIES  Final      Susceptibility   Pseudomonas aeruginosa - MIC*    CEFTAZIDIME >=64 RESISTANT Resistant     CIPROFLOXACIN >=4 RESISTANT Resistant     GENTAMICIN >=16 RESISTANT Resistant     IMIPENEM 1 SENSITIVE Sensitive     PIP/TAZO Value in next row Resistant      RESISTANT>=128    * HEAVY GROWTH PSEUDOMONAS AERUGINOSA   Citrobacter species - MIC*    PIP/TAZO Value in next row Resistant      RESISTANT>=128    CEFTAZIDIME Value in next row Sensitive      SENSITIVE<=4    CEFEPIME Value in next row Sensitive      SENSITIVE<=1    CEFAZOLIN Value in next row Resistant      RESISTANT>=64    CEFTRIAXONE Value in next row Sensitive      SENSITIVE8    CIPROFLOXACIN Value in next row Sensitive      SENSITIVE<=0.25    GENTAMICIN Value in next row Sensitive      SENSITIVE<=1    IMIPENEM Value in next row Sensitive      SENSITIVE<=0.25    TRIMETH/SULFA Value in next row Sensitive      SENSITIVE<=20    * MODERATE GROWTH CITROBACTER SPECIES  Culture, respiratory (NON-Expectorated)     Status: None   Collection Time: 12/23/14 12:00 PM  Result Value Ref Range Status   Specimen Description SPUTUM  Final   Special Requests NONE  Final   Gram Stain   Final    MODERATE WBC SEEN FEW GRAM NEGATIVE RODS EXCELLENT SPECIMEN - 90-100% WBCS    Culture   Final    HEAVY GROWTH PSEUDOMONAS AERUGINOSA This organism isolate is resistant to one or more antiotic agents in  three or more antimicrobial categories.  Suggest Infectious Disease consult.   CRITICAL RESULT CALLED TO, READ BACK BY AND VERIFIED WITH: PAM CRAWFORD AT 1047 12/25/14 DV    Report Status 12/25/2014 FINAL  Final   Organism ID, Bacteria PSEUDOMONAS AERUGINOSA  Final      Susceptibility  Pseudomonas aeruginosa - MIC*    CEFTAZIDIME 16 INTERMEDIATE Intermediate     CIPROFLOXACIN >=4 RESISTANT Resistant     GENTAMICIN >=16 RESISTANT Resistant     IMIPENEM >=16 RESISTANT Resistant     PIP/TAZO Value in next row Sensitive      SENSITIVE32    * HEAVY GROWTH PSEUDOMONAS AERUGINOSA    Medical History: Past Medical History  Diagnosis Date  . Tracheostomy in place   . Diabetes   . CKD (chronic kidney disease)   . OSA (obstructive sleep apnea)   . HTN (hypertension)   . MDD (major depressive disorder)     Medications:  Scheduled:  . apixaban  5 mg Oral BID  . atorvastatin  80 mg Oral q1800  . escitalopram  5 mg Oral Daily  . feeding supplement (GLUCERNA SHAKE)  237 mL Oral TID WC  . furosemide  40 mg Oral Daily  . insulin aspart  0-5 Units Subcutaneous QHS  . insulin aspart  0-9 Units Subcutaneous TID WC  . meropenem (MERREM) IV  1 g Intravenous 3 times per day  . metoprolol  2.5 mg Intravenous 4 times per day  . pantoprazole  40 mg Oral Daily  . QUEtiapine  50 mg Oral QHS  . senna-docusate  1 tablet Oral BID   Infusions:   PRN: acetaminophen **OR** acetaminophen, ALPRAZolam, alum & mag hydroxide-simeth, hydrALAZINE, HYDROcodone-acetaminophen, Influenza vac split quadrivalent PF, ipratropium-albuterol, morphine injection, ondansetron **OR** ondansetron (ZOFRAN) IV, pneumococcal 23 valent vaccine  Assessment: 69 y/o F with acute respiratory failure on ciprofloxacin for ESBL E coli UTI. Tracheal aspirate culture growing Pseudomonas resistant to all but imipenem as well as Citrobacter. Patient is currently on day 7 of meropenem and will need to continue therapy through 09/23 per ID.   (Per ID note: if develops fever, leukcyotosis consider adding inhaled Tobramycin)  Plan:  Will continue Meropenem 1 g IV q8 hours.   Bari Mantis PharmD Clinical Pharmacist 12/26/2014 2:58 PM

## 2014-12-26 NOTE — Progress Notes (Signed)
Pt is able to tolerate PO medication. IV metoprolol 2.5mg  currently ordered. Paged Dr.Sona Allena Katz. Verbal order to change metoprolol to 12.5mg  3 times a day. See new order. See discontinued order.

## 2014-12-26 NOTE — Progress Notes (Signed)
CC/HPI  RASS 0. + F/C. Comfortable on TCT, alert and awake, follows commands   Filed Vitals:   12/26/14 0700 12/26/14 0800 12/26/14 0830 12/26/14 0900  BP: 127/79 161/138 141/93 136/83  Pulse: 84 82 74 88  Temp:   98.1 F (36.7 C)   TempSrc:   Axillary   Resp: Height:      Weight:      SpO2: 96% 97% 98% 94%   NAD HEENT WNL Trach site clean Sternotomy scar noted No wheezes Reg, no M, prominent S2 Abd mildly distended, + tympani, NT, NABS Ext without edema, + chronic stasis changes No focal neuro deficits   BMP Latest Ref Rng 12/26/2014 12/25/2014 12/23/2014  Glucose 65 - 99 mg/dL 409(W) 119(J) 92  BUN 6 - 20 mg/dL 47(W) 29(F) 62(Z)  Creatinine 0.44 - 1.00 mg/dL 3.08 6.57 8.46  Sodium 135 - 145 mmol/L 143 141 143  Potassium 3.5 - 5.1 mmol/L 3.8 4.1 4.3  Chloride 101 - 111 mmol/L 105 103 106  CO2 22 - 32 mmol/L 34(H) 33(H) 31  Calcium 8.9 - 10.3 mg/dL 11.4(H) 11.6(H) 11.6(H)    CBC Latest Ref Rng 12/25/2014 12/23/2014 12/22/2014  WBC 3.6 - 11.0 K/uL 4.5 4.3 4.3  Hemoglobin 12.0 - 16.0 g/dL 9.6(E) 9.5(M) 8.4(X)  Hematocrit 35.0 - 47.0 % 28.8(L) 27.7(L) 27.6(L)  Platelets 150 - 440 K/uL 157 133(L) 121(L)    CXR: persistent R pleural effusion   INDWELLING DEVICES:: Trach tube - chronic LUE PICC 9/11 >>   MICRO DATA: MRSA PCR 9/04 >> POS Urine 9/04 >> ESBL E coli Blood 9/04 >> NEG Resp 9/06 >> Citrobacter and pseudomonas Resp 9/14 >> pseudomonas  ANTIMICROBIALS:  Meropenem through 9/23 per ID service   IMPRESSION: Chronic trach dependence Acute on chronic respiratory failure  Likely pulm edema/CHF ESBL E coli  UTI Psedomonas/citrobacter tracheobronchitis vs PNA  PLAN/REC: Cont TCT as long as tolerated during daytime/night time Abx per ID - Meropenem through 9/23 Will assess back to NH next week if remains off vent  I have personally obtained a history, examined the patient, evaluated Pertinent laboratory and RadioGraphic/imaging results, and   formulated the assessment and plan   The Patient requires high complexity decision making for assessment and support, frequent evaluation and titration of therapies. Time Spent with patient 20 mins   Kurian Santiago Glad, M.D.  [facility]  Medical Director [facility] Medical Director [facility] Cardio-Pulmonary Department

## 2014-12-27 ENCOUNTER — Inpatient Hospital Stay: Payer: Medicare Other

## 2014-12-27 LAB — TROPONIN I: Troponin I: 0.03 ng/mL (ref <0.031)

## 2014-12-27 LAB — BLOOD GAS, ARTERIAL
ACID-BASE EXCESS: 4.3 mmol/L — AB (ref 0.0–3.0)
Bicarbonate: 31.5 mEq/L — ABNORMAL HIGH (ref 21.0–28.0)
FIO2: 0.5
Mechanical Rate: 16
O2 SAT: 90.5 %
PATIENT TEMPERATURE: 37
PCO2 ART: 57 mmHg — AB (ref 32.0–48.0)
PEEP: 5 cmH2O
PH ART: 7.35 (ref 7.350–7.450)
RATE: 16 resp/min
VT: 500 mL
pO2, Arterial: 63 mmHg — ABNORMAL LOW (ref 83.0–108.0)

## 2014-12-27 LAB — CBC
HCT: 31.4 % — ABNORMAL LOW (ref 35.0–47.0)
Hemoglobin: 9.8 g/dL — ABNORMAL LOW (ref 12.0–16.0)
MCH: 27.5 pg (ref 26.0–34.0)
MCHC: 31.2 g/dL — ABNORMAL LOW (ref 32.0–36.0)
MCV: 88.2 fL (ref 80.0–100.0)
Platelets: 161 10*3/uL (ref 150–440)
RBC: 3.56 MIL/uL — ABNORMAL LOW (ref 3.80–5.20)
RDW: 20.7 % — ABNORMAL HIGH (ref 11.5–14.5)
WBC: 4.8 10*3/uL (ref 3.6–11.0)

## 2014-12-27 LAB — BASIC METABOLIC PANEL
Anion gap: 8 (ref 5–15)
BUN: 27 mg/dL — ABNORMAL HIGH (ref 6–20)
CO2: 32 mmol/L (ref 22–32)
Calcium: 11.7 mg/dL — ABNORMAL HIGH (ref 8.9–10.3)
Chloride: 102 mmol/L (ref 101–111)
Creatinine, Ser: 1.27 mg/dL — ABNORMAL HIGH (ref 0.44–1.00)
GFR calc Af Amer: 49 mL/min — ABNORMAL LOW (ref >60)
GFR calc non Af Amer: 42 mL/min — ABNORMAL LOW (ref >60)
Glucose, Bld: 211 mg/dL — ABNORMAL HIGH (ref 65–99)
Potassium: 4.1 mmol/L (ref 3.5–5.1)
Sodium: 142 mmol/L (ref 135–145)

## 2014-12-27 LAB — GLUCOSE, CAPILLARY
GLUCOSE-CAPILLARY: 158 mg/dL — AB (ref 65–99)
GLUCOSE-CAPILLARY: 200 mg/dL — AB (ref 65–99)
GLUCOSE-CAPILLARY: 247 mg/dL — AB (ref 65–99)
Glucose-Capillary: 121 mg/dL — ABNORMAL HIGH (ref 65–99)
Glucose-Capillary: 152 mg/dL — ABNORMAL HIGH (ref 65–99)
Glucose-Capillary: 124 mg/dL — ABNORMAL HIGH (ref 65–99)

## 2014-12-27 MED ORDER — MEGESTROL ACETATE 400 MG/10ML PO SUSP
400.0000 mg | Freq: Every day | ORAL | Status: DC
  Administered 2014-12-27 – 2015-01-02 (×5): 400 mg via ORAL
  Filled 2014-12-27 (×7): qty 10

## 2014-12-27 MED ORDER — FENTANYL 2500MCG IN NS 250ML (10MCG/ML) PREMIX INFUSION
25.0000 ug/h | INTRAVENOUS | Status: DC
  Administered 2014-12-27: 50 ug/h via INTRAVENOUS
  Administered 2014-12-31: 25 ug/h via INTRAVENOUS
  Filled 2014-12-27 (×3): qty 250

## 2014-12-27 MED ORDER — FAMOTIDINE 20 MG PO TABS
40.0000 mg | ORAL_TABLET | Freq: Every day | ORAL | Status: DC
  Administered 2014-12-27 – 2015-01-04 (×7): 40 mg via ORAL
  Filled 2014-12-27 (×7): qty 2

## 2014-12-27 MED ORDER — BISACODYL 10 MG RE SUPP
10.0000 mg | RECTAL | Status: DC
  Administered 2014-12-27 – 2014-12-31 (×2): 10 mg via RECTAL
  Filled 2014-12-27 (×2): qty 1

## 2014-12-27 MED ORDER — ATORVASTATIN CALCIUM 20 MG PO TABS: 80.0000 mg | ORAL_TABLET | Freq: Every day | ORAL | Status: DC

## 2014-12-27 MED ORDER — IPRATROPIUM-ALBUTEROL 0.5-2.5 (3) MG/3ML IN SOLN
3.0000 mL | Freq: Four times a day (QID) | RESPIRATORY_TRACT | Status: DC
  Administered 2014-12-28 – 2014-12-30 (×9): 3 mL via RESPIRATORY_TRACT
  Filled 2014-12-27 (×10): qty 3

## 2014-12-27 MED ORDER — SODIUM CHLORIDE 0.9 % IJ SOLN
10.0000 mL | INTRAMUSCULAR | Status: DC | PRN
  Administered 2014-12-27: 10 mL
  Filled 2014-12-27: qty 40

## 2014-12-27 MED ORDER — FENTANYL CITRATE (PF) 100 MCG/2ML IJ SOLN
50.0000 ug | Freq: Once | INTRAMUSCULAR | Status: AC
  Administered 2014-12-27: 50 ug via INTRAVENOUS

## 2014-12-27 MED ORDER — MIDAZOLAM HCL 2 MG/2ML IJ SOLN
1.0000 mg | INTRAMUSCULAR | Status: DC | PRN
Start: 2014-12-27 — End: 2014-12-28

## 2014-12-27 MED ORDER — VANCOMYCIN HCL 10 G IV SOLR
1250.0000 mg | Freq: Once | INTRAVENOUS | Status: AC
  Administered 2014-12-27: 1250 mg via INTRAVENOUS
  Filled 2014-12-27: qty 1250

## 2014-12-27 MED ORDER — FENTANYL BOLUS VIA INFUSION
25.0000 ug | INTRAVENOUS | Status: DC | PRN
  Filled 2014-12-27: qty 25

## 2014-12-27 MED ORDER — MIDAZOLAM HCL 2 MG/2ML IJ SOLN: 1.0000 mg | INTRAMUSCULAR | Status: DC | PRN

## 2014-12-27 MED ORDER — VANCOMYCIN HCL 10 G IV SOLR
1250.0000 mg | Freq: Two times a day (BID) | INTRAVENOUS | Status: DC
  Administered 2014-12-28: 1250 mg via INTRAVENOUS
  Filled 2014-12-27 (×4): qty 1250

## 2014-12-27 MED ORDER — IPRATROPIUM-ALBUTEROL 0.5-2.5 (3) MG/3ML IN SOLN: 3.0000 mL | RESPIRATORY_TRACT | Status: DC | PRN

## 2014-12-27 MED ORDER — SODIUM CHLORIDE 0.9 % IJ SOLN
10.0000 mL | Freq: Two times a day (BID) | INTRAMUSCULAR | Status: DC
  Administered 2014-12-27: 20 mL
  Administered 2014-12-27 – 2015-01-03 (×14): 10 mL
  Administered 2015-01-03 – 2015-01-04 (×2): 20 mL

## 2014-12-27 MED ORDER — IPRATROPIUM-ALBUTEROL 0.5-2.5 (3) MG/3ML IN SOLN
3.0000 mL | Freq: Four times a day (QID) | RESPIRATORY_TRACT | Status: DC
  Administered 2014-12-27: 3 mL via RESPIRATORY_TRACT
  Filled 2014-12-27: qty 3

## 2014-12-27 MED ORDER — ISOSORBIDE DINITRATE 20 MG PO TABS
30.0000 mg | ORAL_TABLET | Freq: Every day | ORAL | Status: DC
  Administered 2014-12-27 – 2015-01-04 (×7): 30 mg via ORAL
  Filled 2014-12-27 (×2): qty 2
  Filled 2014-12-27: qty 1
  Filled 2014-12-27: qty 3
  Filled 2014-12-27: qty 1
  Filled 2014-12-27 (×3): qty 2

## 2014-12-27 MED ORDER — VANCOMYCIN HCL IN DEXTROSE 1-5 GM/200ML-% IV SOLN: 1000.0000 mg | Freq: Once | INTRAVENOUS | Status: DC

## 2014-12-27 MED ORDER — ADULT MULTIVITAMIN W/MINERALS CH
1.0000 | ORAL_TABLET | Freq: Every day | ORAL | Status: DC
  Administered 2014-12-27 – 2015-01-02 (×5): 1 via ORAL
  Filled 2014-12-27 (×5): qty 1

## 2014-12-27 MED ORDER — CARVEDILOL 6.25 MG PO TABS
6.2500 mg | ORAL_TABLET | Freq: Two times a day (BID) | ORAL | Status: DC
  Administered 2014-12-27 – 2014-12-30 (×4): 6.25 mg via ORAL
  Filled 2014-12-27 (×4): qty 1

## 2014-12-27 MED FILL — Medication: Qty: 1 | Status: AC

## 2014-12-27 NOTE — Progress Notes (Signed)
Notified of decreasing heart rate on telemetry.  Unit staff in room, code blue called with patient noticed being unresponsive and apneic.  Last turned and repositioned 20 mins previously and pt without change in condition.  Code blue called.  See code blue sheet.

## 2014-12-27 NOTE — Progress Notes (Signed)
2nd nurse to verify skin on admission Leanna.

## 2014-12-27 NOTE — ED Notes (Signed)
Called to bedside for a CODE BLUE. Patient found to be pulseless and apneic. She was receiving bagged ventilation through her trach with coarse breath sounds bilaterally. Upon arrival were instructed to give 1 mg of epinephrine through her PICC line. Fingerstick blood sugar was obtained. Chest compressions were started while these medications were being given. She had return of spontaneous circulation with good pulse and blood pressure. Fingerstick blood sugar was within normal limits. Care was handed over to Dr. Anne Hahn and the patient was being admitted to the ICU. Currently vital signs were stable at the time of departure.  Emily Filbert, MD 12/27/14 2223

## 2014-12-27 NOTE — ED Notes (Signed)
[facility] Department of Emergency Medicine   Code Blue CONSULT NOTE  Chief Complaint: Cardiac arrest/unresponsive   Level V Caveat: Unresponsive  History of present illness: I was contacted by the hospital for a CODE BLUE cardiac arrest upstairs and presented to the patient's bedside.    ROS: Unable to obtain, Level V caveat  Scheduled Meds: . apixaban  5 mg Oral BID  . atorvastatin  80 mg Oral q1800  . bisacodyl  10 mg Rectal QODAY  . carvedilol  6.25 mg Oral BID  . escitalopram  5 mg Oral Daily  . famotidine  40 mg Oral Daily  . feeding supplement (GLUCERNA SHAKE)  237 mL Oral TID WC  . fentaNYL (SUBLIMAZE) injection  50 mcg Intravenous Once  . furosemide  40 mg Oral Daily  . insulin aspart  0-5 Units Subcutaneous QHS  . insulin aspart  0-9 Units Subcutaneous TID WC  . ipratropium-albuterol  3 mL Nebulization QID  . isosorbide dinitrate  30 mg Oral Daily  . megestrol  400 mg Oral Daily  . meropenem (MERREM) IV  1 g Intravenous 3 times per day  . metoprolol tartrate  12.5 mg Oral TID  . multivitamin with minerals  1 tablet Oral Daily  . pantoprazole  40 mg Oral Daily  . QUEtiapine  50 mg Oral QHS  . senna-docusate  1 tablet Oral BID  . sodium chloride  10-40 mL Intracatheter Q12H  . vancomycin  1,000 mg Intravenous Once   Continuous Infusions: . fentaNYL infusion INTRAVENOUS     PRN Meds:.acetaminophen **OR** acetaminophen, ALPRAZolam, alum & mag hydroxide-simeth, fentaNYL, hydrALAZINE, HYDROcodone-acetaminophen, Influenza vac split quadrivalent PF, ipratropium-albuterol, midazolam, midazolam, ondansetron **OR** ondansetron (ZOFRAN) IV, pneumococcal 23 valent vaccine, sodium chloride Past Medical History  Diagnosis Date  . Tracheostomy in place   . Diabetes   . CKD (chronic kidney disease)   . OSA (obstructive sleep apnea)   . HTN (hypertension)   . MDD (major depressive disorder)    History reviewed. No pertinent past surgical  history. Social History   Social History  . Marital Status: Single    Spouse Name: N/A  . Number of Children: N/A  . Years of Education: N/A   Occupational History  . Not on file.   Social History Main Topics  . Smoking status: Never Smoker   . Smokeless tobacco: Not on file  . Alcohol Use: Not on file  . Drug Use: Not on file  . Sexual Activity: Not on file   Other Topics Concern  . Not on file   Social History Narrative   Allergies  Allergen Reactions  . Codeine     Has tolerated oxycodone in the past.    Last set of Vital Signs (not current) Filed Vitals:   12/27/14 2106  BP: 113/75  Pulse: 77  Temp: 98.3 F (36.8 C)  Resp: 20      Physical Exam  Gen: unresponsive Cardiovascular: pulseless  Resp: apneic. Breath sounds equal bilaterally with bagging  Abd: nondistended  Neuro: GCS 3, unresponsive to pain  HEENT: Patient being bagged via trach. Musculoskeletal: No deformity  Skin: warm  CRITICAL CARE Performed by: Emily Filbert Total critical care time: 10 minutes Critical care time was exclusive of separately billable procedures and treating other patients. Critical care was necessary to treat or prevent imminent or life-threatening deterioration. Critical care was time spent personally by me on the following activities: development of treatment plan with patient and/or surrogate as well  as nursing, discussions with consultants, evaluation of patient's response to treatment, examination of patient, obtaining history from patient or surrogate, ordering and performing treatments and interventions, ordering and review of laboratory studies, ordering and review of radiographic studies, pulse oximetry and re-evaluation of patient's condition.  Cardiopulmonary Resuscitation (CPR) Procedure Note  Directed/Performed by: Emily Filbert I personally directed ancillary staff and/or performed CPR in an effort to regain return of spontaneous circulation  and to maintain cardiac, neuro and systemic perfusion.    Medical Decision making  Patient was given a milligram of epinephrine and subsequently had return of spontaneous circulation.  Assessment and Plan  Cardiac arrest Unclear etiology for arrest. She will be subsequently transported to the ICU, under the care of Dr. Anne Hahn.   Emily Filbert, MD 12/27/14 2252

## 2014-12-27 NOTE — Progress Notes (Signed)
Rechecked patient blood pressure and 02 sat. Sat dropped to 85% on 28% 5l o2. Called respiratory to evaluate patient, instructed to increase to 35% 8L. Respiratory will be on the floor to evaluate patient and adjust 02 at needed. Will continue to monitor closely [employer]

## 2014-12-27 NOTE — Progress Notes (Signed)
Spoke with dr. Allena Katz to make aware patient blood is 96/49 per md no new orders. Also made md aware patient was given 0.25 xanax and  zofran, patient currently sedated wakes to stimulation and voice. o2 was increased from 28 to 35. Sat currently low to mid 90's on 35%. Per md okay to keep patients sat 87 and above wean back to 28%. Will continue to monitor closely [employer]

## 2014-12-27 NOTE — Progress Notes (Signed)
Responded to Code Blue on 2A. Pt successfully resuscitated but remains obtunded. Transported to CCU and initiated on vent.

## 2014-12-27 NOTE — Progress Notes (Signed)
[facility] [place] at [facility]   PATIENT NAME: Crislyn Willbanks    MR#:  [id number]  DATE OF BIRTH:  1945/12/06  SUBJECTIVE:    patient has been on trach collar for the last 72 hours    REVIEW OF SYSTEMS:  Review of Systems  Unable to perform ROS  DRUG ALLERGIES:   Allergies  Allergen Reactions  . Codeine     Has tolerated oxycodone in the past.   VITALS:  Blood pressure 103/63, pulse 80, temperature 98.4 F (36.9 C), temperature source Oral, resp. rate 20, height  (1.702 m), weight 103.148 kg (227 lb 6.4 oz), SpO2 95 %. PHYSICAL EXAMINATION:  Physical Exam  Constitutional: She is well-developed, well-nourished, and in no distress.appears chronically ill  HENT:  Head: Normocephalic and atraumatic.  Trach in place  Eyes: Conjunctivae are normal. Pupils are equal, round, and reactive to light.  Neck: Normal range of motion. Neck supple. No tracheal deviation present. No thyromegaly present.  Cardiovascular: Normal rate, regular rhythm and normal heart sounds.   Pulmonary/Chest: Effort normal and breath sounds normal. No respiratory distress. She has no wheezes. She exhibits no tenderness.  Abdominal: Soft. Bowel sounds are normal. She exhibits no distension. There is no tenderness.  Musculoskeletal: Normal range of motion.  Neurological: No cranial nerve deficit.  Skin: Skin is warm and dry. No rash noted.  Psychiatric: Mood and affect normal.   LABORATORY PANEL:   CBC  Recent Labs Lab 12/25/14 0459  WBC 4.5  HGB 9.3*  HCT 28.8*  PLT 157   ------------------------------------------------------------------------------------------------------------------ Chemistries   Recent Labs Lab 12/22/14 0439  12/26/14 0458  NA 140  < > 143  K 4.5  < > 3.8  CL 105  < > 105  CO2 31  < > 34*  GLUCOSE 117*  < > 109*  BUN 25*  < > 23*  CREATININE 0.81  < > 0.81  CALCIUM 11.0*  < > 11.4*  MG 1.9  --   --   < > = values in this interval  not displayed. RADIOLOGY:  No results found. ASSESSMENT AND PLAN:  This is a 69 year old female with a history of tracheostomy, atrial fibrillation, diabetes who presents with sepsis.  1. Sepsis with hypotension: secondary to urinary tract infection with ESBL ecoli Hypotension resolved  Appreciate ID input. Abx changed to Meropenem day #10/14 Continue meropenem for a total of 14 days until 01/01/2015 as per ID recommendations. 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 ESBL E.coli Urinary tract infection: Meropenem as above    3. Healthcare acquired pneumonia:  Continue meropenem as trach suction sputum growing Pseudomonas & Citrobacter and MRSA  4. Acute respiratory failure: Trach collar per pulmonary recommends being in a place where they also can provide ventilator at nighttime -for no pt doing well on trach collar only. Dr Belia Heman recommends keep pt over the weekend to ensure she is doing ok  5. Atrial fibrillation: Her heart rate is well controlled at this time  6. Diabetes:   Sliding scale insulin blood glucose oral stable   7. Depression/anxiety: Anxiolytics as needed basis  8. Fungal skin infection: continue nystatin.  9. Nutrition- oral intake  10 . Deconditioning with PT as tolerated  Disposition-[facility] /snf.  CODE STATUS: Full code  TOTAL TIME  TAKING CARE OF THIS PATIENT: 35 minutes.   PATEL,SONA M.D on 12/27/2014 at  1:55 PM  Between 7am to 6pm - Pager - [phone]  After 6pm go to [email or web address] - password [email or web address] [facility]  [facility]  Office  [phone]  CC: Primary care physician; Dorothey Baseman, MD

## 2014-12-27 NOTE — Progress Notes (Signed)
Responded to Code Blue.  Pt here for prolonged stay with Sepsis due to ESBL UTI, baseline complicated respiratory status with tracheostomy, trach cultures growing Pseudomonas and MRSA?  On abx, had been clinically improving, recently transferred to floor from ICU.  Was pending placement.  Bradycardia on monitor tonight with "widening QRS" per telemetry report, code called, unclear if truly ever pulseless.  Got CPR and 1 round epi with good response. Transferred to unit.  Labs and CXR, EKG ordered, now pending.  Vancomycin added to meropenem.  Will follow up closely.  Kristeen Miss [facility] 12/27/2014, 10:40 PM

## 2014-12-27 NOTE — Progress Notes (Signed)
Pt's emergency contact called numerous times.  No answer and unable to leave a message due to mailbox being full.  Unable to reach at this time.   Cristela Felt, RN

## 2014-12-27 NOTE — Progress Notes (Signed)
ANTIBIOTIC CONSULT NOTE - INITIAL  Pharmacy Consult for vancomycin/meropenem Indication: pneumonia  Allergies  Allergen Reactions  . Codeine     Has tolerated oxycodone in the past.    Patient Measurements: Height:  (170.2 cm) Weight: 227 lb 6.4 oz (103.148 kg) IBW/kg (Calculated) : 61.6 Adjusted Body Weight: 78.2 kg  Vital Signs: Temp: 98.3 F (36.8 C) (09/18 2106) BP: 113/75 mmHg (09/18 2106) Pulse Rate: 77 (09/18 2106) Intake/Output from previous day: 09/17 0701 - 09/18 0700 In: 440 [P.O.:240; IV Piggyback:200] Out: -  Intake/Output from this shift:    Labs:  Recent Labs  12/25/14 0459 12/26/14 0458  WBC 4.5  --   HGB 9.3*  --   PLT 157  --   CREATININE 0.85 0.81   Estimated Creatinine Clearance: 82.1 mL/min (by C-G formula based on Cr of 0.81). No results for input(s): VANCOTROUGH, VANCOPEAK, VANCORANDOM, GENTTROUGH, GENTPEAK, GENTRANDOM, TOBRATROUGH, TOBRAPEAK, TOBRARND, AMIKACINPEAK, AMIKACINTROU, AMIKACIN in the last 72 hours.   Microbiology: Recent Results (from the past 720 hour(s))  Blood Culture (routine x 2)     Status: None   Collection Time: 12/13/14  9:25 AM  Result Value Ref Range Status   Specimen Description BLOOD LEFT ARM  Final   Special Requests BOTTLES DRAWN AEROBIC AND ANAEROBIC  Final   Culture  Setup Time   Final    GRAM POSITIVE COCCI IN CLUSTERS ANAEROBIC BOTTLE ONLY CRITICAL RESULT CALLED TO, READ BACK BY AND VERIFIED WITH: LESLIE LEWIS 12/14/2014 0630 [facility] CONFIRMED BY [facility]    Culture   Final    COAGULASE NEGATIVE STAPHYLOCOCCUS ANAEROBIC BOTTLE ONLY Results consistent with contamination.    Report Status 12/18/2014 FINAL  Final  Blood Culture (routine x 2)     Status: None   Collection Time: 12/13/14  9:35 AM  Result Value Ref Range Status   Specimen Description BLOOD RIGHT FORARM  Final   Special Requests BOTTLES DRAWN AEROBIC AND ANAEROBIC  Final   Culture NO GROWTH 5 DAYS  Final   Report Status 12/18/2014  FINAL  Final  Urine culture     Status: None   Collection Time: 12/13/14  9:35 AM  Result Value Ref Range Status   Specimen Description URINE, RANDOM  Final   Special Requests NONE  Final   Culture   Final    >=100,000 COLONIES/mL ESCHERICHIA COLI ESBL-EXTENDED SPECTRUM BETA LACTAMASE-THE ORGANISM IS RESISTANT TO PENICILLINS, CEPHALOSPORINS AND AZTREONAM ACCORDING TO CLSI M100-S15 VOL.25 N01 JAN 2005. Results Called to: Derald Macleod AT 1119 12/15/14 CTJ    Report Status 12/15/2014 FINAL  Final   Organism ID, Bacteria ESCHERICHIA COLI  Final      Susceptibility   Escherichia coli - MIC*    AMPICILLIN >=32 RESISTANT Resistant     CEFTAZIDIME 16 RESISTANT Resistant     CEFAZOLIN >=64 RESISTANT Resistant     CEFTRIAXONE 32 RESISTANT Resistant     GENTAMICIN <=1 SENSITIVE Sensitive     IMIPENEM <=0.25 SENSITIVE Sensitive     TRIMETH/SULFA >=320 RESISTANT Resistant     Extended ESBL POSITIVE Resistant     CIPROFLOXACIN Value in next row Sensitive      SENSITIVE<=0.25    NITROFURANTOIN Value in next row Sensitive      SENSITIVE<=16    PIP/TAZO Value in next row Resistant      RESISTANT>=128    ERTAPENEM Value in next row Sensitive      SENSITIVE<=0.5    * >=100,000 COLONIES/mL ESCHERICHIA COLI  MRSA  PCR Screening     Status: Abnormal   Collection Time: 12/13/14 12:20 PM  Result Value Ref Range Status   MRSA by PCR POSITIVE (A) NEGATIVE Final    Comment:        The GeneXpert MRSA Assay (FDA approved for NASAL specimens only), is one component of a comprehensive MRSA colonization surveillance program. It is not intended to diagnose MRSA infection nor to guide or monitor treatment for MRSA infections. CRITICAL RESULT CALLED TO, READ BACK BY AND VERIFIED WITH: Noretta Frier [place]  ON 12/13/14 BY HP   Culture, expectorated sputum-assessment     Status: None   Collection Time: 12/15/14 10:49 AM  Result Value Ref Range Status   Specimen Description ENDOTRACHEAL  Final    Special Requests NONE  Final   Sputum evaluation THIS SPECIMEN IS ACCEPTABLE FOR SPUTUM CULTURE  Final   Report Status 12/16/2014 FINAL  Final  Culture, respiratory (NON-Expectorated)     Status: None   Collection Time: 12/15/14 10:49 AM  Result Value Ref Range Status   Specimen Description ENDOTRACHEAL  Final   Special Requests NONE Reflexed from [id number]  Final   Gram Stain   Final    FEW WBC SEEN FEW GRAM NEGATIVE RODS FEW GRAM POSITIVE COCCI IN PAIRS FAIR SPECIMEN - 70-80% WBCS    Culture   Final    MODERATE GROWTH CITROBACTER SPECIES HEAVY GROWTH PSEUDOMONAS AERUGINOSA MULTI-DRUG RESISTANT ORGANISM CRITICAL RESULT CALLED TO, READ BACK BY AND VERIFIED WITH: [facility] BORBA 12/18/14 1042 JGF    Report Status 12/19/2014 FINAL  Final   Organism ID, Bacteria PSEUDOMONAS AERUGINOSA  Final   Organism ID, Bacteria CITROBACTER SPECIES  Final      Susceptibility   Pseudomonas aeruginosa - MIC*    CEFTAZIDIME >=64 RESISTANT Resistant     CIPROFLOXACIN >=4 RESISTANT Resistant     GENTAMICIN >=16 RESISTANT Resistant     IMIPENEM 1 SENSITIVE Sensitive     PIP/TAZO Value in next row Resistant      RESISTANT>=128    * HEAVY GROWTH PSEUDOMONAS AERUGINOSA   Citrobacter species - MIC*    PIP/TAZO Value in next row Resistant      RESISTANT>=128    CEFTAZIDIME Value in next row Sensitive      SENSITIVE<=4    CEFEPIME Value in next row Sensitive      SENSITIVE<=1    CEFAZOLIN Value in next row Resistant      RESISTANT>=64    CEFTRIAXONE Value in next row Sensitive      SENSITIVE8    CIPROFLOXACIN Value in next row Sensitive      SENSITIVE<=0.25    GENTAMICIN Value in next row Sensitive      SENSITIVE<=1    IMIPENEM Value in next row Sensitive      SENSITIVE<=0.25    TRIMETH/SULFA Value in next row Sensitive      SENSITIVE<=20    * MODERATE GROWTH CITROBACTER SPECIES  Culture, respiratory (NON-Expectorated)     Status: None   Collection Time: 12/23/14 12:00 PM  Result Value Ref Range  Status   Specimen Description SPUTUM  Final   Special Requests NONE  Final   Gram Stain   Final    MODERATE WBC SEEN FEW GRAM NEGATIVE RODS EXCELLENT SPECIMEN - 90-100% WBCS    Culture   Final    HEAVY GROWTH PSEUDOMONAS AERUGINOSA This organism isolate is resistant to one or more antiotic agents in three or more antimicrobial categories.  Suggest Infectious Disease consult.  CRITICAL RESULT CALLED TO, READ BACK BY AND VERIFIED WITH: PAM CRAWFORD AT 1047 12/25/14 DV    Report Status 12/25/2014 FINAL  Final   Organism ID, Bacteria PSEUDOMONAS AERUGINOSA  Final      Susceptibility   Pseudomonas aeruginosa - MIC*    CEFTAZIDIME 16 INTERMEDIATE Intermediate     CIPROFLOXACIN >=4 RESISTANT Resistant     GENTAMICIN >=16 RESISTANT Resistant     IMIPENEM >=16 RESISTANT Resistant     PIP/TAZO Value in next row Sensitive      SENSITIVE32    * HEAVY GROWTH PSEUDOMONAS AERUGINOSA    Medical History: Past Medical History  Diagnosis Date  . Tracheostomy in place   . Diabetes   . CKD (chronic kidney disease)   . OSA (obstructive sleep apnea)   . HTN (hypertension)   . MDD (major depressive disorder)     Medications:  Infusions:  . fentaNYL infusion INTRAVENOUS     Assessment: 31 yof with ESBL UTI and pseudomonal PNA on meropenem coded this evening, starting vancomycin for sepsis.   Vd 54.7 L, Ke 0.073 hr-1, T1/2 9.5 hr, predicted trough 17 mcg/mL  Goal of Therapy:  Vancomycin trough level 15-20 mcg/ml  Plan:  Expected duration 7 days with resolution of temperature and/or normalization of WBC, continue meropenem 1 gm IV Q8H, start vancomycin 1.25 gm IV Q12H with stacked dosing the second dose 7 hours after the first. Will continue to follow and adjust dose as needed to maintain trough 15 to 20 mcg/mL.   Carola Frost, Pharm.D. Clinical Pharmacist 12/27/2014,10:52 PM

## 2014-12-27 NOTE — Progress Notes (Signed)
CC/HPI Patient with acute resp failure and acute cardiac arrest, patient noted to have acute bradycardia then possible low pulse,  CPR started given epi x 1  Patient placed back on full vent support   Filed Vitals:   12/27/14 1547 12/27/14 1611 12/27/14 1738 12/27/14 2106  BP: 99/61   113/75  Pulse:    77  Temp:    98.3 F (36.8 C)  TempSrc:      Resp:    20  Height:      Weight:      SpO2: 97% 91% 94% 97%   Review of Systems  Unable to perform ROS: critical illness   Physical Exam  Constitutional: She appears distressed.  HENT:  Head: Normocephalic and atraumatic.  Eyes: Pupils are equal, round, and reactive to light.  Neck: Normal range of motion.  S/p trach  Cardiovascular: Normal rate, regular rhythm and normal heart sounds.   Pulmonary/Chest: She is in respiratory distress. She has wheezes. She has rales.  Abdominal: Soft.  Neurological:  gcs<8T  Skin: Skin is warm. She is diaphoretic. No erythema.      BMP Latest Ref Rng 12/26/2014 12/25/2014 12/23/2014  Glucose 65 - 99 mg/dL 161(W) 960(A) 92  BUN 6 - 20 mg/dL 54(U) 98(J) 19(J)  Creatinine 0.44 - 1.00 mg/dL 4.78 2.95 6.21  Sodium 135 - 145 mmol/L 143 141 143  Potassium 3.5 - 5.1 mmol/L 3.8 4.1 4.3  Chloride 101 - 111 mmol/L 105 103 106  CO2 22 - 32 mmol/L 34(H) 33(H) 31  Calcium 8.9 - 10.3 mg/dL 11.4(H) 11.6(H) 11.6(H)    CBC Latest Ref Rng 12/25/2014 12/23/2014 12/22/2014  WBC 3.6 - 11.0 K/uL 4.5 4.3 4.3  Hemoglobin 12.0 - 16.0 g/dL 3.0(Q) 6.5(H) 8.4(O)  Hematocrit 35.0 - 47.0 % 28.8(L) 27.7(L) 27.6(L)  Platelets 150 - 440 K/uL 157 133(L) 121(L)    CXR: persistent R pleural effusion   INDWELLING DEVICES:: Trach tube - chronic LUE PICC 9/11 >>   MICRO DATA: MRSA PCR 9/04 >> POS Urine 9/04 >> ESBL E coli Blood 9/04 >> NEG Resp 9/06 >> Citrobacter and pseudomonas Resp 9/14 >> pseudomonas  ANTIMICROBIALS:  Meropenem through 9/23 per ID service  69 yo obese white female with acute on chronic resp  failure s/p trach with acute resp failure and acute cardiac arrest Acute resp failure?etiology likely acute cardiac event  IMPRESSION: Place back on vent-obtain ABG Chronic trach dependence Acute on chronic respiratory failure  Likely pulm edema/CHF-will obtain echo ESBL E coli  UTI Psedomonas/citrobacter tracheobronchitis vs PNA  PLAN/REC: Place back on vetn-wean fio2 as tolerated -check cardiac enzymes, check echo Abx per ID - Meropenem through 9/23   I have personally obtained a history, examined the patient, evaluated Pertinent laboratory and RadioGraphic/imaging results, and  formulated the assessment and plan   The Patient requires high complexity decision making for assessment and support, frequent evaluation and titration of therapies, application of advanced monitoring technologies and extensive interpretation of multiple databases. Critical Care Time devoted to patient care services described in this note is  35  minutes.   Overall, patient is critically ill, prognosis is guarded.  Patient with Multiorgan failure and at high risk for cardiac arrest and death.    Lucie Leather, M.D.  [facility]  Medical Director [facility] Medical Director [facility] Cardio-Pulmonary Department      Wallis Bamberg Santiago Glad, M.D.  [facility]  Medical Director [facility] Medical Director [facility]  Cardio-Pulmonary Department

## 2014-12-28 ENCOUNTER — Inpatient Hospital Stay
Admit: 2014-12-28 | Discharge: 2014-12-28 | Disposition: A | Discharge status: Home / Self Care | Payer: Medicare Other | Attending: Internal Medicine | Admitting: Internal Medicine

## 2014-12-28 ENCOUNTER — Inpatient Hospital Stay: Payer: Medicare Other

## 2014-12-28 LAB — GLUCOSE, CAPILLARY
GLUCOSE-CAPILLARY: 89 mg/dL (ref 65–99)
GLUCOSE-CAPILLARY: 80 mg/dL (ref 65–99)
Glucose-Capillary: 119 mg/dL — ABNORMAL HIGH (ref 65–99)
Glucose-Capillary: 102 mg/dL — ABNORMAL HIGH (ref 65–99)

## 2014-12-28 LAB — TROPONIN I
TROPONIN I: 0.07 ng/mL — AB (ref <0.031)
TROPONIN I: 0.08 ng/mL — AB (ref <0.031)

## 2014-12-28 MED ORDER — FUROSEMIDE 10 MG/ML IJ SOLN
40.0000 mg | Freq: Once | INTRAMUSCULAR | Status: AC
  Administered 2014-12-28: 40 mg via INTRAVENOUS
  Filled 2014-12-28: qty 4

## 2014-12-28 MED ORDER — SODIUM CHLORIDE 0.9 % IV SOLN
INTRAVENOUS | Status: DC
  Administered 2014-12-28 – 2015-01-04 (×4): via INTRAVENOUS

## 2014-12-28 NOTE — Clinical Social Work Note (Signed)
Patient has had a long and complicated course and is currently back on continuous ventilator. CSW spoke with patient's son: Rachel Brady: [phone] and he is in agreement for CSW to make referral to long term vent facilities: [facility] and [facility] [place] in [place]. [name] does not wish for [facility]. York Spaniel MSW,LCSW [phone]

## 2014-12-28 NOTE — Progress Notes (Signed)
eLink Physician-Brief Progress Note Patient Name: Rachel Brady DOB: July 08, 1945 MRN: [id number]  Post arrest camera care - stable looking and vitalks ok. Sync on vent  Lab review  - rising creat  Plan - no acute e-ICU interventions    Intervention Category Evaluation Type: Other  RAMASWAMY,MURALI 12/28/2014, 1:00 AM

## 2014-12-28 NOTE — Progress Notes (Addendum)
69 yo obese white female with acute on chronic resp failure s/p trach with acute resp failure and acute cardiac arrest Acute resp failure?etiology likely acute cardiac event  IMPRESSION: Place back on vent-obtain ABG Chronic trach dependence Acute hypoxic respiratory failure due to pneumonia, and possibly pulmonary edema. Now vent dependent. Not able to tolerate a weaning trial today.  Chronic hypercapnic respiratory failure, likely due to COPD, emphysema, possibly obesity hypoventilation. Acute on chronic diastolic congestive heart failure. Pulmonary hypertension, likely group 2 and group. 3. 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 -check cardiac enzymes, check echo Abx per ID - Meropenem through 9/23. Continue apixaban.    CC/HPI Patient with acute resp failure and acute cardiac arrest, patient noted to have acute bradycardia then possible low pulse,  CPR started given epi x 1  Patient placed back on full vent support   Filed Vitals:   12/28/14 0419 12/28/14 0500 12/28/14 0600 12/28/14 0825  BP:  105/74 121/80   Pulse:  76 78   Temp:   98.5 F (36.9 C)   TempSrc:   Axillary   Resp:  16 18   Height:      Weight: 104.8 kg (231 lb 0.7 oz)     SpO2:  99% 99% 99%   Review of Systems  Unable to perform ROS: critical illness  . Also, the patient is currently on a ventilator.  Physical Exam  Constitutional: She appears distressed.  HENT:  Head: Normocephalic and atraumatic.  Eyes: Pupils are equal, round, and reactive to light.  Neck: Normal range of motion.  S/p trach  Cardiovascular: Normal rate, regular rhythm and normal heart sounds.   Pulmonary/Chest: She is in respiratory distress. She has wheezes. She has  rales.  Abdominal: Soft.  Neurological:  gcs<8T  Skin: Skin is warm. She is diaphoretic. No erythema.      BMP Latest Ref Rng 12/27/2014 12/26/2014 12/25/2014  Glucose 65 - 99 mg/dL 161(W) 960(A) 540(J)  BUN 6 - 20 mg/dL 81(X) 91(Y) 78(G)  Creatinine 0.44 - 1.00 mg/dL 9.56(O) 1.30 8.65  Sodium 135 - 145 mmol/L 142 143 141  Potassium 3.5 - 5.1 mmol/L 4.1 3.8 4.1  Chloride 101 - 111 mmol/L 102 105 103  CO2 22 - 32 mmol/L 32 34(H) 33(H)  Calcium 8.9 - 10.3 mg/dL 11.7(H) 11.4(H) 11.6(H)    CBC Latest Ref Rng 12/27/2014 12/25/2014 12/23/2014  WBC 3.6 - 11.0 K/uL 4.8 4.5 4.3  Hemoglobin 12.0 - 16.0 g/dL 7.8(I) 6.9(G) 2.9(B)  Hematocrit 35.0 - 47.0 % 31.4(L) 28.8(L) 27.7(L)  Platelets 150 - 440 K/uL 161 157 133(L)    CXR: persistent R pleural atelectasis bilaterally with bilateral hyperinflation.   INDWELLING DEVICES:: Trach tube - chronic LUE PICC 9/11 >>   MICRO DATA: MRSA PCR 9/04 >> POS Urine 9/04 >> ESBL E coli Blood 9/04 >> NEG Resp 9/06 >> Citrobacter and pseudomonas Resp 9/14 >> pseudomonas  ANTIMICROBIALS:  Meropenem through 9/23 per ID service     Deep Nicholos Johns, M.D.  [facility]   Critical Care Attestation.  I have personally obtained a history, examined the patient, evaluated laboratory and imaging results, formulated the assessment and plan and placed orders. The Patient requires high complexity decision making for assessment and support, frequent evaluation  and titration of therapies, application of advanced monitoring technologies and extensive interpretation of multiple databases. The patient has critical illness that could lead imminently to failure of 1 or more organ systems and requires the highest level of physician preparedness to intervene.  Critical Care Time devoted to patient care services described in this note is 35 minutes and is exclusive of time spent in procedures.

## 2014-12-28 NOTE — Progress Notes (Signed)
[facility] - [place] at [facility]   PATIENT NAME: Rachel Brady    MR#:  [id number]  DATE OF BIRTH:  27-Feb-1946  SUBJECTIVE:    patient moved to CCU after COD Blue was called last pm. Events noted. Placed back o vent. Vitals stable.. On Fentanyl gtt, awake. Wants to eat and get off the vent REVIEW OF SYSTEMS:  Review of Systems  Unable to perform ROS  DRUG ALLERGIES:   Allergies  Allergen Reactions  . Codeine     Has tolerated oxycodone in the past.   VITALS:  Blood pressure 121/80, pulse 78, temperature 98.5 F (36.9 C), temperature source Axillary, resp. rate 18, height  (1.702 m), weight 104.8 kg (231 lb 0.7 oz), SpO2 99 %. PHYSICAL EXAMINATION:  Physical Exam  Constitutional: She is well-developed, well-nourished, and in no distress.appears chronically ill  HENT:  Head: Normocephalic and atraumatic.  Trach in place  Eyes: Conjunctivae are normal. Pupils are equal, round, and reactive to light.  Neck: Normal range of motion. Neck supple. No tracheal deviation present. No thyromegaly present.  Cardiovascular: Normal rate, regular rhythm and normal heart sounds.   Pulmonary/Chest: Effort normal and breath sounds normal. No respiratory distress. She has no wheezes. She exhibits no tenderness Abdominal: Soft. Bowel sounds are normal. She exhibits no distension. There is no tenderness.  Musculoskeletal: Normal range of motion.  Neurological: No cranial nerve deficit.  Skin: Skin is warm and dry. No rash noted.  Psychiatric: Mood and affect normal.   LABORATORY PANEL:   CBC  Recent Labs Lab 12/27/14 2252  WBC 4.8  HGB 9.8*  HCT 31.4*  PLT 161   ------------------------------------------------------------------------------------------------------------------ Chemistries   Recent Labs Lab 12/22/14 0439  12/27/14 2252  NA 140  < > 142  K 4.5  < > 4.1  CL 105  < > 102  CO2 31  < > 32  GLUCOSE 117*  < > 211*  BUN 25*  < > 27*   CREATININE 0.81  < > 1.27*  CALCIUM 11.0*  < > 11.7*  MG 1.9  --   --   < > = values in this interval not displayed. RADIOLOGY:  Dg Chest 1 View  12/28/2014   CLINICAL DATA:  Pneumonia.  EXAM: CHEST  1 VIEW  COMPARISON:  Nineteen 2016 .  FINDINGS: Tracheostomy tube in stable position. Left PICC line stable position. Prior CABG and aortic valve replacement. Persistent severe cardiomegaly. Interim partial clearing of pulmonary edema and/or infiltrates. Persistent bilateral small pleural effusions. No pneumothorax. Prior thoracic spine fusion .  IMPRESSION: 1. Lines and tubes in stable position. 2. Prior CABG and aortic valve replacement . Persistent severe cardiomegaly . 3. Interim partial clearing of bilateral pulmonary edema and/or infiltrate. Persistent small bilateral pleural effusions.   Electronically Signed   By: Maisie Fus  Register   On: 12/28/2014 07:36   Dg Chest Port 1 View  12/27/2014   CLINICAL DATA:  Respiratory failure, diabetes mellitus, hypertension  EXAM: PORTABLE CHEST - 1 VIEW  COMPARISON:  Portable exam 2252 hr compared to 12/25/2014  FINDINGS: Rotated to the LEFT.  Tracheostomy tube projects over tracheal air column.  LEFT arm PICC line has been partially withdrawn, tip projecting over the confluence of the brachiocephalic vein and SVC.  External pacing leads present.  Enlargement of cardiac silhouette post CABG and AVR.  Pulmonary vascular congestion.  Slightly improved pulmonary edema.  Bibasilar effusions.  No pneumothorax.  IMPRESSION: Enlargement of cardiac silhouette post cardiac  surgery with pulmonary vascular congestion and slightly improved pulmonary edema.  Bibasilar effusions persist.   Electronically Signed   By: Ulyses Southward M.D.   On: 12/27/2014 23:56   ASSESSMENT AND PLAN:  This is a 69 year old female with a history of tracheostomy, atrial fibrillation, diabetes who presents with sepsis.  1. Sepsis with hypotension: secondary to urinary tract infection with ESBL  ecoli Hypotension resolved  Appreciate ID input. Abx changed to Meropenem day #11/14 Continue meropenem for a total of 14 days until 01/01/2015 as per ID recommendations. 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 ESBL E.coli Urinary tract infection: Meropenem as above    3. Healthcare acquired pneumonia:  Continue meropenem as trach suction sputum growing Pseudomonas & Citrobacter and MRSA  4. Acute respiratory failure: acute on chronic -pt was tolerating trach collar and apparently code blue was called. Likely had mucous plug. Now stable on the vent. Vent wean per ICU pulm  5. Atrial fibrillation: Her heart rate is well controlled at this time  6. Diabetes:   Sliding scale insulin blood glucose oral stable   7. Depression/anxiety: Anxiolytics as needed basis  8. Fungal skin infection: continue nystatin.  9. Nutrition- oral intake once off vent  Spoke with son Callie Fielding D/w dr Darlin Priestly Disposition-[facility].  CODE STATUS: Full code  TOTAL critical TIME  TAKING CARE OF THIS PATIENT: 35 minutes.   Rilea Arutyunyan M.D on 12/28/2014 at 10:47 AM  Between 7am to 6pm - Pager - [phone]  After 6pm go to [email or web address] - password [email or web address] [facility]  [facility]  Office  [phone]  CC: Primary care physician; Dorothey Baseman, MD

## 2014-12-28 NOTE — Progress Notes (Signed)
[facility] CLINIC INFECTIOUS DISEASE PROGRESS NOTE Date of Admission:  12/13/2014     ID: Rachel Brady is a 69 y.o. female with ESBL UTI, Psuedomonal PNA  Active Problems:   Sepsis   Respiratory distress   Pressure ulcer   Subjective: Transferred to floor but then code blue 9/18 and back in unit on vent. Still with trach secretions. No fevers  ROS  Eleven systems are reviewed and negative except per hpi  Medications:  Antibiotics Given (last 72 hours)    Date/Time Action Medication Dose Rate   12/25/14 1633 Given   meropenem (MERREM) 1 g in sodium chloride 0.9 % 100 mL IVPB 1 g 200 mL/hr   12/25/14 2154 Given   meropenem (MERREM) 1 g in sodium chloride 0.9 % 100 mL IVPB 1 g 200 mL/hr   12/26/14 0556 Given   meropenem (MERREM) 1 g in sodium chloride 0.9 % 100 mL IVPB 1 g 200 mL/hr   12/26/14 1754 Given   meropenem (MERREM) 1 g in sodium chloride 0.9 % 100 mL IVPB 1 g 200 mL/hr   12/26/14 2338 Given   meropenem (MERREM) 1 g in sodium chloride 0.9 % 100 mL IVPB 1 g 200 mL/hr   12/27/14 0533 Given   meropenem (MERREM) 1 g in sodium chloride 0.9 % 100 mL IVPB 1 g 200 mL/hr   12/27/14 1414 Given   meropenem (MERREM) 1 g in sodium chloride 0.9 % 100 mL IVPB 1 g 200 mL/hr   12/27/14 2315 Given   meropenem (MERREM) 1 g in sodium chloride 0.9 % 100 mL IVPB 1 g 200 mL/hr   12/27/14 2334 Given   vancomycin (VANCOCIN) 1,250 mg in sodium chloride 0.9 % 250 mL IVPB 1,250 mg 166.7 mL/hr   12/28/14 0607 Given   meropenem (MERREM) 1 g in sodium chloride 0.9 % 100 mL IVPB 1 g 200 mL/hr   12/28/14 0917 Given  [not up from pharmacy to administer]   vancomycin (VANCOCIN) 1,250 mg in sodium chloride 0.9 % 250 mL IVPB 1,250 mg 166.7 mL/hr     . apixaban  5 mg Oral BID  . atorvastatin  80 mg Oral q1800  . bisacodyl  10 mg Rectal QODAY  . carvedilol  6.25 mg Oral BID  . escitalopram  5 mg Oral Daily  . famotidine  40 mg Oral Daily  . feeding supplement (GLUCERNA SHAKE)  237 mL Oral TID WC  .  furosemide  40 mg Oral Daily  . insulin aspart  0-5 Units Subcutaneous QHS  . insulin aspart  0-9 Units Subcutaneous TID WC  . ipratropium-albuterol  3 mL Nebulization QID  . isosorbide dinitrate  30 mg Oral Daily  . megestrol  400 mg Oral Daily  . meropenem (MERREM) IV  1 g Intravenous 3 times per day  . multivitamin with minerals  1 tablet Oral Daily  . QUEtiapine  50 mg Oral QHS  . senna-docusate  1 tablet Oral BID  . sodium chloride  10-40 mL Intracatheter Q12H    Objective: Vital signs in last 24 hours: Temp:  [97.7 F (36.5 C)-99.8 F (37.7 C)] 99.8 F (37.7 C) (09/19 1200) Pulse Rate:  [57-83] 74 (09/19 0700) Resp:  [15-25] 19 (09/19 0700) BP: (74-126)/(49-104) 125/97 mmHg (09/19 0700) SpO2:  [85 %-100 %] 99 % (09/19 0825) FiO2 (%):  [28 %-50 %] 28 % (09/19 1147) Weight:  [104.8 kg (231 lb 0.7 oz)] 104.8 kg (231 lb 0.7 oz) (09/19 0419) Constitutional: Trached, sedated  HENT: [place]/AT, PERRLA, no scleral icterus Mouth/Throat: Oropharynx is clear and dry . No oropharyngeal exudate.  Cardiovascular: Normal rate, regular rhythm and normal heart sounds. Exam reveals no gallop and no friction rub.  Pulmonary/Chest: rhonchi bilateralluy Neck = supple, no nuchal rigidity Abdominal: Soft. Bowel sounds are normal. exhibits no distension. There is no tenderness.  Lymphadenopathy: no cervical adenopathy. No axillary adenopathy Neurological: alert and oriented to person, place, and time.  Skin: Skin is warm and dry. No rash noted. No erythema.  Psychiatric: a normal mood and affect. behavior is normal.  Foley cath  Lab Results  Recent Labs  12/26/14 0458 12/27/14 2252  WBC  --  4.8  HGB  --  9.8*  HCT  --  31.4*  NA 143 142  K 3.8 4.1  CL 105 102  CO2 34* 32  BUN 23* 27*  CREATININE 0.81 1.27*    Microbiology: Results for orders placed or performed during the hospital encounter of 12/13/14  Blood Culture (routine x 2)     Status: None   Collection Time:  12/13/14  9:25 AM  Result Value Ref Range Status   Specimen Description BLOOD LEFT ARM  Final   Special Requests BOTTLES DRAWN AEROBIC AND ANAEROBIC  Final   Culture  Setup Time   Final    GRAM POSITIVE COCCI IN CLUSTERS ANAEROBIC BOTTLE ONLY CRITICAL RESULT CALLED TO, READ BACK BY AND VERIFIED WITH: LESLIE LEWIS 12/14/2014 0630 [facility] CONFIRMED BY [facility]    Culture   Final    COAGULASE NEGATIVE STAPHYLOCOCCUS ANAEROBIC BOTTLE ONLY Results consistent with contamination.    Report Status 12/18/2014 FINAL  Final  Blood Culture (routine x 2)     Status: None   Collection Time: 12/13/14  9:35 AM  Result Value Ref Range Status   Specimen Description BLOOD RIGHT FORARM  Final   Special Requests BOTTLES DRAWN AEROBIC AND ANAEROBIC  Final   Culture NO GROWTH 5 DAYS  Final   Report Status 12/18/2014 FINAL  Final  Urine culture     Status: None   Collection Time: 12/13/14  9:35 AM  Result Value Ref Range Status   Specimen Description URINE, RANDOM  Final   Special Requests NONE  Final   Culture   Final    >=100,000 COLONIES/mL ESCHERICHIA COLI ESBL-EXTENDED SPECTRUM BETA LACTAMASE-THE ORGANISM IS RESISTANT TO PENICILLINS, CEPHALOSPORINS AND AZTREONAM ACCORDING TO CLSI M100-S15 VOL.25 N01 JAN 2005. Results Called to: Derald Macleod AT 1119 12/15/14 CTJ    Report Status 12/15/2014 FINAL  Final   Organism ID, Bacteria ESCHERICHIA COLI  Final      Susceptibility   Escherichia coli - MIC*    AMPICILLIN >=32 RESISTANT Resistant     CEFTAZIDIME 16 RESISTANT Resistant     CEFAZOLIN >=64 RESISTANT Resistant     CEFTRIAXONE 32 RESISTANT Resistant     GENTAMICIN <=1 SENSITIVE Sensitive     IMIPENEM <=0.25 SENSITIVE Sensitive     TRIMETH/SULFA >=320 RESISTANT Resistant     Extended ESBL POSITIVE Resistant     CIPROFLOXACIN Value in next row Sensitive      SENSITIVE<=0.25    NITROFURANTOIN Value in next row Sensitive      SENSITIVE<=16    PIP/TAZO Value in next row Resistant       RESISTANT>=128    ERTAPENEM Value in next row Sensitive      SENSITIVE<=0.5    * >=100,000 COLONIES/mL ESCHERICHIA COLI  MRSA PCR Screening     Status: Abnormal  Collection Time: 12/13/14 12:20 PM  Result Value Ref Range Status   MRSA by PCR POSITIVE (A) NEGATIVE Final    Comment:        The GeneXpert MRSA Assay (FDA approved for NASAL specimens only), is one component of a comprehensive MRSA colonization surveillance program. It is not intended to diagnose MRSA infection nor to guide or monitor treatment for MRSA infections. CRITICAL RESULT CALLED TO, READ BACK BY AND VERIFIED WITH: ADAM SCARBORO  ON 12/13/14 BY HP   Culture, expectorated sputum-assessment     Status: None   Collection Time: 12/15/14 10:49 AM  Result Value Ref Range Status   Specimen Description ENDOTRACHEAL  Final   Special Requests NONE  Final   Sputum evaluation THIS SPECIMEN IS ACCEPTABLE FOR SPUTUM CULTURE  Final   Report Status 12/16/2014 FINAL  Final  Culture, respiratory (NON-Expectorated)     Status: None   Collection Time: 12/15/14 10:49 AM  Result Value Ref Range Status   Specimen Description ENDOTRACHEAL  Final   Special Requests NONE Reflexed from [id number]  Final   Gram Stain   Final    FEW WBC SEEN FEW GRAM NEGATIVE RODS FEW GRAM POSITIVE COCCI IN PAIRS FAIR SPECIMEN - 70-80% WBCS    Culture   Final    MODERATE GROWTH CITROBACTER SPECIES HEAVY GROWTH PSEUDOMONAS AERUGINOSA MULTI-DRUG RESISTANT ORGANISM CRITICAL RESULT CALLED TO, READ BACK BY AND VERIFIED WITH: [facility] BORBA 12/18/14 1042 JGF    Report Status 12/19/2014 FINAL  Final   Organism ID, Bacteria PSEUDOMONAS AERUGINOSA  Final   Organism ID, Bacteria CITROBACTER SPECIES  Final      Susceptibility   Pseudomonas aeruginosa - MIC*    CEFTAZIDIME >=64 RESISTANT Resistant     CIPROFLOXACIN >=4 RESISTANT Resistant     GENTAMICIN >=16 RESISTANT Resistant     IMIPENEM 1 SENSITIVE Sensitive     PIP/TAZO Value in next row Resistant       RESISTANT>=128    * HEAVY GROWTH PSEUDOMONAS AERUGINOSA   Citrobacter species - MIC*    PIP/TAZO Value in next row Resistant      RESISTANT>=128    CEFTAZIDIME Value in next row Sensitive      SENSITIVE<=4    CEFEPIME Value in next row Sensitive      SENSITIVE<=1    CEFAZOLIN Value in next row Resistant      RESISTANT>=64    CEFTRIAXONE Value in next row Sensitive      SENSITIVE8    CIPROFLOXACIN Value in next row Sensitive      SENSITIVE<=0.25    GENTAMICIN Value in next row Sensitive      SENSITIVE<=1    IMIPENEM Value in next row Sensitive      SENSITIVE<=0.25    TRIMETH/SULFA Value in next row Sensitive      SENSITIVE<=20    * MODERATE GROWTH CITROBACTER SPECIES  Culture, respiratory (NON-Expectorated)     Status: None   Collection Time: 12/23/14 12:00 PM  Result Value Ref Range Status   Specimen Description SPUTUM  Final   Special Requests NONE  Final   Gram Stain   Final    MODERATE WBC SEEN FEW GRAM NEGATIVE RODS EXCELLENT SPECIMEN - 90-100% WBCS    Culture   Final    HEAVY GROWTH PSEUDOMONAS AERUGINOSA This organism isolate is resistant to one or more antiotic agents in three or more antimicrobial categories.  Suggest Infectious Disease consult.   CRITICAL RESULT CALLED TO, READ BACK BY AND VERIFIED WITH: PAM  CRAWFORD AT 1047 12/25/14 DV    Report Status 12/25/2014 FINAL  Final   Organism ID, Bacteria PSEUDOMONAS AERUGINOSA  Final      Susceptibility   Pseudomonas aeruginosa - MIC*    CEFTAZIDIME 16 INTERMEDIATE Intermediate     CIPROFLOXACIN >=4 RESISTANT Resistant     GENTAMICIN >=16 RESISTANT Resistant     IMIPENEM >=16 RESISTANT Resistant     PIP/TAZO Value in next row Sensitive      SENSITIVE32    * HEAVY GROWTH PSEUDOMONAS AERUGINOSA    Studies/Results: Dg Chest 1 View  12/28/2014   CLINICAL DATA:  Pneumonia.  EXAM: CHEST  1 VIEW  COMPARISON:  Nineteen 2016 .  FINDINGS: Tracheostomy tube in stable position. Left PICC line stable position. Prior  CABG and aortic valve replacement. Persistent severe cardiomegaly. Interim partial clearing of pulmonary edema and/or infiltrates. Persistent bilateral small pleural effusions. No pneumothorax. Prior thoracic spine fusion .  IMPRESSION: 1. Lines and tubes in stable position. 2. Prior CABG and aortic valve replacement . Persistent severe cardiomegaly . 3. Interim partial clearing of bilateral pulmonary edema and/or infiltrate. Persistent small bilateral pleural effusions.   Electronically Signed   By: Maisie Fus  Register   On: 12/28/2014 07:36   Dg Chest Port 1 View  12/27/2014   CLINICAL DATA:  Respiratory failure, diabetes mellitus, hypertension  EXAM: PORTABLE CHEST - 1 VIEW  COMPARISON:  Portable exam 2252 hr compared to 12/25/2014  FINDINGS: Rotated to the LEFT.  Tracheostomy tube projects over tracheal air column.  LEFT arm PICC line has been partially withdrawn, tip projecting over the confluence of the brachiocephalic vein and SVC.  External pacing leads present.  Enlargement of cardiac silhouette post CABG and AVR.  Pulmonary vascular congestion.  Slightly improved pulmonary edema.  Bibasilar effusions.  No pneumothorax.  IMPRESSION: Enlargement of cardiac silhouette post cardiac surgery with pulmonary vascular congestion and slightly improved pulmonary edema.  Bibasilar effusions persist.   Electronically Signed   By: Ulyses Southward M.D.   On: 12/27/2014 23:56    Assessment/Plan: Rachel Brady is a 69 y.o. female admitted with sepsis from ESBL E colu UTI, PNA and respiratory arrest. Bcx 1/2 with CNS likely contaminant.  Sutum with MDR Psuedomonas (two cultures with different sensitivities) and citrobacter.  Recommendations Continue meropenem - 14 day course from initiation of appropriate coverage for the pseudomonas (cannot use ertapenem as poor Pseudomonas activity) Stop date 01/01/15 WIll need continue aggressive pulmonary toilet as well FU sputum again grew psuedomonas that was resistant to  imipimen but sens to zosyn (opposite of initial isolate) It is R to gent and intermediate to tobramycin - If develops fevers, leukocytosis would add inhaled tobra in addition to the meropenem I will sign off but please call if develops fevers, increased wbc or other signs of worsening infection  FITZGERALD, DAVID   12/28/2014, 1:36 PM

## 2014-12-28 NOTE — Care Management Important Message (Signed)
Important Message  Patient Details  Name: Rachel Brady MRN: [id number] Date of Birth: 07/20/1945   Medicare Important Message Given:  Yes-second notification given    Marily Memos, RN 12/28/2014, 11:10 AM

## 2014-12-28 NOTE — Progress Notes (Signed)
*  PRELIMINARY RESULTS* Echocardiogram 2D Echocardiogram has been performed.  Garrel Ridgel Stills 12/28/2014, 11:24 AM

## 2014-12-29 ENCOUNTER — Inpatient Hospital Stay: Payer: Medicare Other

## 2014-12-29 DIAGNOSIS — J158 Pneumonia due to other specified bacteria: Secondary | ICD-10-CM

## 2014-12-29 LAB — CBC
HCT: 29.7 % — ABNORMAL LOW (ref 35.0–47.0)
Hemoglobin: 9.4 g/dL — ABNORMAL LOW (ref 12.0–16.0)
MCH: 26.7 pg (ref 26.0–34.0)
MCHC: 31.6 g/dL — ABNORMAL LOW (ref 32.0–36.0)
MCV: 84.5 fL (ref 80.0–100.0)
PLATELETS: 153 10*3/uL (ref 150–440)
RBC: 3.51 MIL/uL — AB (ref 3.80–5.20)
RDW: 20 % — AB (ref 11.5–14.5)
WBC: 6.3 10*3/uL (ref 3.6–11.0)

## 2014-12-29 LAB — BLOOD GAS, ARTERIAL
ACID-BASE EXCESS: 14.2 mmol/L — AB (ref 0.0–3.0)
ALLENS TEST (PASS/FAIL): POSITIVE — AB
Bicarbonate: 38.5 mEq/L — ABNORMAL HIGH (ref 21.0–28.0)
FIO2: 0.28
Mechanical Rate: 16
O2 SAT: 97.1 %
PATIENT TEMPERATURE: 37
PCO2 ART: 45 mmHg (ref 32.0–48.0)
PEEP/CPAP: 5 cmH2O
VT: 500 mL
pH, Arterial: 7.54 — ABNORMAL HIGH (ref 7.350–7.450)
pO2, Arterial: 80 mmHg — ABNORMAL LOW (ref 83.0–108.0)

## 2014-12-29 LAB — BASIC METABOLIC PANEL
ANION GAP: 5 (ref 5–15)
BUN: 26 mg/dL — ABNORMAL HIGH (ref 6–20)
CALCIUM: 11.7 mg/dL — AB (ref 8.9–10.3)
CO2: 35 mmol/L — ABNORMAL HIGH (ref 22–32)
Chloride: 104 mmol/L (ref 101–111)
Creatinine, Ser: 1.2 mg/dL — ABNORMAL HIGH (ref 0.44–1.00)
GFR, EST AFRICAN AMERICAN: 53 mL/min — AB (ref >60)
GFR, EST NON AFRICAN AMERICAN: 45 mL/min — AB (ref >60)
GLUCOSE: 80 mg/dL (ref 65–99)
POTASSIUM: 3.8 mmol/L (ref 3.5–5.1)
SODIUM: 144 mmol/L (ref 135–145)

## 2014-12-29 LAB — GLUCOSE, CAPILLARY
GLUCOSE-CAPILLARY: 84 mg/dL (ref 65–99)
Glucose-Capillary: 80 mg/dL (ref 65–99)
Glucose-Capillary: 93 mg/dL (ref 65–99)
Glucose-Capillary: 82 mg/dL (ref 65–99)

## 2014-12-29 MED ORDER — LORAZEPAM 2 MG/ML IJ SOLN
1.0000 mg | Freq: Once | INTRAMUSCULAR | Status: AC
Start: 2014-12-29 — End: 2014-12-29
  Administered 2014-12-29: 1 mg via INTRAVENOUS
  Filled 2014-12-29: qty 1

## 2014-12-29 NOTE — Progress Notes (Signed)
ANTIBIOTIC CONSULT NOTE - FOLLOW UP   Pharmacy Consult for Meropenm Indication: ESBL UTI/Pseudomonas PNA  Allergies  Allergen Reactions  . Codeine     Has tolerated oxycodone in the past.    Patient Measurements: Height:  (170.2 cm) Weight: 230 lb 13.2 oz (104.7 kg) IBW/kg (Calculated) : 61.6   Vital Signs: Temp: 98.5 F (36.9 C) (09/20 0800) Temp Source: Axillary (09/20 0400) BP: 139/73 mmHg (09/20 1000) Pulse Rate: 91 (09/20 1000) Intake/Output from previous day: 09/19 0701 - 09/20 0700 In: 643.7 [I.V.:343.7; IV Piggyback:300] Out: 1400 [Urine:1400] Intake/Output from this shift:    Labs:  Recent Labs  12/27/14 2252 12/29/14 0520  WBC 4.8 6.3  HGB 9.8* 9.4*  PLT 161 153  CREATININE 1.27* 1.20*   Estimated Creatinine Clearance: 55.8 mL/min (by C-G formula based on Cr of 1.2). No results for input(s): VANCOTROUGH, VANCOPEAK, VANCORANDOM, GENTTROUGH, GENTPEAK, GENTRANDOM, TOBRATROUGH, TOBRAPEAK, TOBRARND, AMIKACINPEAK, AMIKACINTROU, AMIKACIN in the last 72 hours.   Microbiology: Recent Results (from the past 720 hour(s))  Blood Culture (routine x 2)     Status: None   Collection Time: 12/13/14  9:25 AM  Result Value Ref Range Status   Specimen Description BLOOD LEFT ARM  Final   Special Requests BOTTLES DRAWN AEROBIC AND ANAEROBIC  Final   Culture  Setup Time   Final    GRAM POSITIVE COCCI IN CLUSTERS ANAEROBIC BOTTLE ONLY CRITICAL RESULT CALLED TO, READ BACK BY AND VERIFIED WITH: LESLIE LEWIS 12/14/2014 0630 [facility] CONFIRMED BY [facility]    Culture   Final    COAGULASE NEGATIVE STAPHYLOCOCCUS ANAEROBIC BOTTLE ONLY Results consistent with contamination.    Report Status 12/18/2014 FINAL  Final  Blood Culture (routine x 2)     Status: None   Collection Time: 12/13/14  9:35 AM  Result Value Ref Range Status   Specimen Description BLOOD RIGHT FORARM  Final   Special Requests BOTTLES DRAWN AEROBIC AND ANAEROBIC  Final   Culture NO GROWTH 5 DAYS   Final   Report Status 12/18/2014 FINAL  Final  Urine culture     Status: None   Collection Time: 12/13/14  9:35 AM  Result Value Ref Range Status   Specimen Description URINE, RANDOM  Final   Special Requests NONE  Final   Culture   Final    >=100,000 COLONIES/mL ESCHERICHIA COLI ESBL-EXTENDED SPECTRUM BETA LACTAMASE-THE ORGANISM IS RESISTANT TO PENICILLINS, CEPHALOSPORINS AND AZTREONAM ACCORDING TO CLSI M100-S15 VOL.25 N01 JAN 2005. Results Called to: Derald Macleod AT 1119 12/15/14 CTJ    Report Status 12/15/2014 FINAL  Final   Organism ID, Bacteria ESCHERICHIA COLI  Final      Susceptibility   Escherichia coli - MIC*    AMPICILLIN >=32 RESISTANT Resistant     CEFTAZIDIME 16 RESISTANT Resistant     CEFAZOLIN >=64 RESISTANT Resistant     CEFTRIAXONE 32 RESISTANT Resistant     GENTAMICIN <=1 SENSITIVE Sensitive     IMIPENEM <=0.25 SENSITIVE Sensitive     TRIMETH/SULFA >=320 RESISTANT Resistant     Extended ESBL POSITIVE Resistant     CIPROFLOXACIN Value in next row Sensitive      SENSITIVE<=0.25    NITROFURANTOIN Value in next row Sensitive      SENSITIVE<=16    PIP/TAZO Value in next row Resistant      RESISTANT>=128    ERTAPENEM Value in next row Sensitive      SENSITIVE<=0.5    * >=100,000 COLONIES/mL ESCHERICHIA COLI  MRSA PCR  Screening     Status: Abnormal   Collection Time: 12/13/14 12:20 PM  Result Value Ref Range Status   MRSA by PCR POSITIVE (A) NEGATIVE Final    Comment:        The GeneXpert MRSA Assay (FDA approved for NASAL specimens only), is one component of a comprehensive MRSA colonization surveillance program. It is not intended to diagnose MRSA infection nor to guide or monitor treatment for MRSA infections. CRITICAL RESULT CALLED TO, READ BACK BY AND VERIFIED WITH: ADAM SCARBORO  ON 12/13/14 BY HP   Culture, expectorated sputum-assessment     Status: None   Collection Time: 12/15/14 10:49 AM  Result Value Ref Range Status   Specimen  Description ENDOTRACHEAL  Final   Special Requests NONE  Final   Sputum evaluation THIS SPECIMEN IS ACCEPTABLE FOR SPUTUM CULTURE  Final   Report Status 12/16/2014 FINAL  Final  Culture, respiratory (NON-Expectorated)     Status: None   Collection Time: 12/15/14 10:49 AM  Result Value Ref Range Status   Specimen Description ENDOTRACHEAL  Final   Special Requests NONE Reflexed from [id number]  Final   Gram Stain   Final    FEW WBC SEEN FEW GRAM NEGATIVE RODS FEW GRAM POSITIVE COCCI IN PAIRS FAIR SPECIMEN - 70-80% WBCS    Culture   Final    MODERATE GROWTH CITROBACTER SPECIES HEAVY GROWTH PSEUDOMONAS AERUGINOSA MULTI-DRUG RESISTANT ORGANISM CRITICAL RESULT CALLED TO, READ BACK BY AND VERIFIED WITH: [facility] BORBA 12/18/14 1042 JGF    Report Status 12/19/2014 FINAL  Final   Organism ID, Bacteria PSEUDOMONAS AERUGINOSA  Final   Organism ID, Bacteria CITROBACTER SPECIES  Final      Susceptibility   Pseudomonas aeruginosa - MIC*    CEFTAZIDIME >=64 RESISTANT Resistant     CIPROFLOXACIN >=4 RESISTANT Resistant     GENTAMICIN >=16 RESISTANT Resistant     IMIPENEM 1 SENSITIVE Sensitive     PIP/TAZO Value in next row Resistant      RESISTANT>=128    * HEAVY GROWTH PSEUDOMONAS AERUGINOSA   Citrobacter species - MIC*    PIP/TAZO Value in next row Resistant      RESISTANT>=128    CEFTAZIDIME Value in next row Sensitive      SENSITIVE<=4    CEFEPIME Value in next row Sensitive      SENSITIVE<=1    CEFAZOLIN Value in next row Resistant      RESISTANT>=64    CEFTRIAXONE Value in next row Sensitive      SENSITIVE8    CIPROFLOXACIN Value in next row Sensitive      SENSITIVE<=0.25    GENTAMICIN Value in next row Sensitive      SENSITIVE<=1    IMIPENEM Value in next row Sensitive      SENSITIVE<=0.25    TRIMETH/SULFA Value in next row Sensitive      SENSITIVE<=20    * MODERATE GROWTH CITROBACTER SPECIES  Culture, respiratory (NON-Expectorated)     Status: None   Collection Time: 12/23/14  12:00 PM  Result Value Ref Range Status   Specimen Description SPUTUM  Final   Special Requests NONE  Final   Gram Stain   Final    MODERATE WBC SEEN FEW GRAM NEGATIVE RODS EXCELLENT SPECIMEN - 90-100% WBCS    Culture   Final    HEAVY GROWTH PSEUDOMONAS AERUGINOSA This organism isolate is resistant to one or more antiotic agents in three or more antimicrobial categories.  Suggest Infectious Disease consult.   CRITICAL  RESULT CALLED TO, READ BACK BY AND VERIFIED WITH: PAM CRAWFORD AT 1047 12/25/14 DV    Report Status 12/25/2014 FINAL  Final   Organism ID, Bacteria PSEUDOMONAS AERUGINOSA  Final      Susceptibility   Pseudomonas aeruginosa - MIC*    CEFTAZIDIME 16 INTERMEDIATE Intermediate     CIPROFLOXACIN >=4 RESISTANT Resistant     GENTAMICIN >=16 RESISTANT Resistant     IMIPENEM >=16 RESISTANT Resistant     PIP/TAZO Value in next row Sensitive      SENSITIVE32    * HEAVY GROWTH PSEUDOMONAS AERUGINOSA    Medical History: Past Medical History  Diagnosis Date  . Tracheostomy in place   . Diabetes   . CKD (chronic kidney disease)   . OSA (obstructive sleep apnea)   . HTN (hypertension)   . MDD (major depressive disorder)     Medications:  Scheduled:  . apixaban  5 mg Oral BID  . atorvastatin  80 mg Oral q1800  . bisacodyl  10 mg Rectal QODAY  . carvedilol  6.25 mg Oral BID  . escitalopram  5 mg Oral Daily  . famotidine  40 mg Oral Daily  . feeding supplement (GLUCERNA SHAKE)  237 mL Oral TID WC  . furosemide  40 mg Oral Daily  . insulin aspart  0-5 Units Subcutaneous QHS  . insulin aspart  0-9 Units Subcutaneous TID WC  . ipratropium-albuterol  3 mL Nebulization QID  . isosorbide dinitrate  30 mg Oral Daily  . megestrol  400 mg Oral Daily  . meropenem (MERREM) IV  1 g Intravenous 3 times per day  . multivitamin with minerals  1 tablet Oral Daily  . QUEtiapine  50 mg Oral QHS  . senna-docusate  1 tablet Oral BID  . sodium chloride  10-40 mL Intracatheter Q12H    Infusions:  . sodium chloride 10 mL/hr at 12/29/14 1000  . fentaNYL infusion INTRAVENOUS 50 mcg/hr (12/29/14 0604)   PRN: acetaminophen **OR** acetaminophen, alum & mag hydroxide-simeth, fentaNYL, hydrALAZINE, Influenza vac split quadrivalent PF, ipratropium-albuterol, ondansetron **OR** ondansetron (ZOFRAN) IV, pneumococcal 23 valent vaccine, sodium chloride  Assessment: 69 y/o F with acute respiratory failure on ciprofloxacin for ESBL E coli UTI. Tracheal aspirate culture growing Pseudomonas resistant to all but imipenem as well as Citrobacter. Patient is currently on day 11 of meropenem and will need to continue therapy through 09/23 per ID.   Plan:  Will continue Merrem 1 g IV q8 hours.   Rachel Brady D 12/29/2014,1:29 PM

## 2014-12-29 NOTE — Progress Notes (Signed)
[facility]  Critical Care Medicine Progess Note    ASSESSMENT/PLAN   69 yo obese white female with acute on chronic resp failure s/p trach with acute resp failure and acute cardiac arrest, now with left lower lobe pneumonia.   IMPRESSION:  Acute respiratory failure, vent dependence with mucous plug, left lower lobe atelectasis, left lower lobe pneumonia.  -Patient is currently back on the ventilator. I'm not sure that she can be completely weaned off and maintained only on a tracheostomy. We will attempt to wean off the ventilator today and monitor her progress. If it does not look like she can be taken off the vent for an indefinite period of time, we will likely have to place a feeding tube and consider long-term placement in the chronic ventilator facility.  Altered mental status, suspect secondary to metabolic encephalopathy. -Continue supportive care, cannot rule out possible element of anoxic encephalopathy from her recent episode of cardiac arrest. -Patient is being continued. Intermittent push the fentanyl for agitation and discomfort. We'll try to minimize this.  Chronic trach dependence, with chronic respiratory failure. -Continue to wean from the ventilator as tolerated. -Chronic hypercapnic respiratory failure, likely due to COPD, emphysema, possibly obesity hypoventilation.  Acute on chronic diastolic congestive heart failure. -Continue furosemide at 20 mg once daily.  Pulmonary hypertension, likely group 2 and-or group. 3.  Likely pulm edema/CHF-will obtain echo ESBL E coli UTI Psedomonas/citrobacter tracheobronchitis vs PNA -Continue broad-spectrum antibiotics, infectious disease services, following.  Coronary artery disease, S/P CABG x 4 -Continue current regimen.  S/P aortic valve replacement with bioprosthetic valve -Currently on apixaban .   Nutrition. -We will attempt feeding today after weaning from the ventilator, the patient cannot be weaned from the  ventilator or she cannot tolerate feeding, may need to place a nasogastric tube and start tube feeding. -Asians currently on Pepcid.  ---------------------------------------  CXR 12/29/14 image reviewed; continued LLL atelectasis/pneumonia.  ---------------------------------------- Case was discussed with the critical care RN, nutrition services, critical care pharmacist.  Name: Rachel Brady MRN: [id number] DOB: 1946/01/08    ADMISSION DATE:  12/13/2014    CHIEF COMPLAINT: Dyspnea   STUDIES:  --   SUBJECTIVE:   Patient is sleepy but arousable on the ventilator.  Review of Systems:  Pt currently on the ventilator, can not provide history or review of systems.    VITAL SIGNS: Temp:  [98.5 F (36.9 C)-100 F (37.8 C)] 98.5 F (36.9 C) (09/20 0800) Pulse Rate:  [65-101] 84 (09/20 0800) Resp:  [14-25] 16 (09/20 0800) BP: (100-171)/(64-106) 129/76 mmHg (09/20 0800) SpO2:  [88 %-100 %] 99 % (09/20 0800) FiO2 (%):  [28 %] 28 % (09/20 0755) Weight:  [104.7 kg (230 lb 13.2 oz)] 104.7 kg (230 lb 13.2 oz) (09/20 0450) HEMODYNAMICS:   VENTILATOR SETTINGS: Vent Mode:  [-] PRVC FiO2 (%):  [28 %] 28 % Set Rate:  [16 bmp] 16 bmp Vt Set:  [500 mL] 500 mL PEEP:  [5 cmH20] 5 cmH20 Plateau Pressure:  [19 cmH20] 19 cmH20 INTAKE / OUTPUT:  Intake/Output Summary (Last 24 hours) at 12/29/14 0946 Last data filed at 12/29/14 0604  Gross per 24 hour  Intake 613.67 ml  Output   1400 ml  Net -786.33 ml    PHYSICAL EXAMINATION: Physical Examination:   VS: BP 129/76 mmHg  Pulse 84  Temp(Src) 98.5 F (36.9 C) (Axillary)  Resp 16  Ht  (1.702 m)  Wt 104.7 kg (230 lb 13.2 oz)  BMI 36.14 kg/m2  SpO2 99%  General Appearance: No distress  Neuro:without focal findings, lethargic but arousable. HEENT: PERRLA, EOM intact. Pulmonary: normal breath sounds   CardiovascularNormal S1,S2.  No m/r/g.   Abdomen: Benign, Soft, non-tender. Renal:  No costovertebral tenderness  GU:   Not performed at this time. Endocrine: No evident thyromegaly. Skin:   warm, no rashes, no ecchymosis  Extremities: normal, no cyanosis, clubbing.   LABS:   LABORATORY PANEL:   CBC  Recent Labs Lab 12/29/14 0520  WBC 6.3  HGB 9.4*  HCT 29.7*  PLT 153    Chemistries   Recent Labs Lab 12/29/14 0520  NA 144  K 3.8  CL 104  CO2 35*  GLUCOSE 80  BUN 26*  CREATININE 1.20*  CALCIUM 11.7*     Recent Labs Lab 12/27/14 2225 12/28/14 0718 12/28/14 1220 12/28/14 1606 12/28/14 2107 12/29/14 0751  GLUCAP 200* 119* 102* 89 80 80    Recent Labs Lab 12/27/14 2248 12/29/14 0419  PHART 7.35 7.54*  PCO2ART 57* 45  PO2ART 63* 80*   No results for input(s): AST, ALT, ALKPHOS, BILITOT, ALBUMIN in the last 168 hours.  Cardiac Enzymes  Recent Labs Lab 12/28/14 1208  TROPONINI 0.08*    RADIOLOGY:  Dg Chest 1 View  12/29/2014   CLINICAL DATA:  Dyspnea.  EXAM: CHEST  1 VIEW  COMPARISON:  12/28/2014 and 12/25/2014  FINDINGS: Left-sided PICC line and tracheostomy tube unchanged. Median sternotomy wires and prosthetic heart valve unchanged.  Lungs are somewhat hypoinflated and demonstrate continued moderate opacification over the left mid to lower lung/retrocardiac region without significant change likely moderate size effusion with associated compressive atelectasis. Evidence of a small right pleural effusion without significant change. Cannot exclude infection in the lung bases. Prominence of the perihilar markings slightly improved likely mild interstitial edema. Moderate cardiomegaly which is stable. Remainder of the exam is unchanged.  IMPRESSION: Moderate stable cardiomegaly with slightly improved interstitial edema. Stable small right pleural effusion. Evidence of stable moderate left pleural effusion and associated atelectasis. Cannot exclude infection in the lung bases.   Electronically Signed   By: Elberta Fortis M.D.   On: 12/29/2014 08:18   Dg Chest 1  View  12/28/2014   CLINICAL DATA:  Pneumonia.  EXAM: CHEST  1 VIEW  COMPARISON:  Nineteen 2016 .  FINDINGS: Tracheostomy tube in stable position. Left PICC line stable position. Prior CABG and aortic valve replacement. Persistent severe cardiomegaly. Interim partial clearing of pulmonary edema and/or infiltrates. Persistent bilateral small pleural effusions. No pneumothorax. Prior thoracic spine fusion .  IMPRESSION: 1. Lines and tubes in stable position. 2. Prior CABG and aortic valve replacement . Persistent severe cardiomegaly . 3. Interim partial clearing of bilateral pulmonary edema and/or infiltrate. Persistent small bilateral pleural effusions.   Electronically Signed   By: Maisie Fus  Register   On: 12/28/2014 07:36   Dg Chest Port 1 View  12/27/2014   CLINICAL DATA:  Respiratory failure, diabetes mellitus, hypertension  EXAM: PORTABLE CHEST - 1 VIEW  COMPARISON:  Portable exam 2252 hr compared to 12/25/2014  FINDINGS: Rotated to the LEFT.  Tracheostomy tube projects over tracheal air column.  LEFT arm PICC line has been partially withdrawn, tip projecting over the confluence of the brachiocephalic vein and SVC.  External pacing leads present.  Enlargement of cardiac silhouette post CABG and AVR.  Pulmonary vascular congestion.  Slightly improved pulmonary edema.  Bibasilar effusions.  No pneumothorax.  IMPRESSION: Enlargement of cardiac silhouette post cardiac surgery with pulmonary vascular congestion and slightly improved  pulmonary edema.  Bibasilar effusions persist.   Electronically Signed   ByUlyses SouthwardBoles M.D.   On: 12/27/2014 23:56       --Wells Guiles, MD.  Pager [phone] [facility] Office Number: [phone]  Santiago Glad, M.D.  Stephanie Acre, M.D.  Billy Fischer, M.D  Critical Care Attestation.  I have personally obtained a history, examined the patient, evaluated laboratory and imaging results, formulated the assessment and plan and placed  orders. The Patient requires high complexity decision making for assessment and support, frequent evaluation and titration of therapies, application of advanced monitoring technologies and extensive interpretation of multiple databases. The patient has critical illness that could lead imminently to failure of 1 or more organ systems and requires the highest level of physician preparedness to intervene.  Critical Care Time devoted to patient care services described in this note is 35 minutes and is exclusive of time spent in procedures.

## 2014-12-29 NOTE — Progress Notes (Signed)
PT Cancellation Note  Patient Details Name: Rachel Brady MRN: [id number] DOB: May 14, 1945   Cancelled Treatment:    Reason Eval/Treat Not Completed: Other (comment) (See PT note for further details ) Pt recently transferred back to CCU and placed on ventilator. Due to change in medical status since last PT visit, will require new therapy orders to treat pt. Please issue new orders if/when appropriate.    Benna Dunks 12/29/2014, 2:46 PM  Benna Dunks, SPT. [phone]

## 2014-12-29 NOTE — Progress Notes (Signed)
Removed from vent,placed on 50% trach collar,tolerating well

## 2014-12-29 NOTE — Clinical Social Work Note (Signed)
CSW had not heard back from [place] [facility] so CSW called [facility] and he stated that they can take patient but it will not be until mid next week at earliest because they don't have a bed right now. [facility] will tentatively hold the bed for this patient. It is unclear if patient will still require a vent/snf facility at that time as well.  York Spaniel MSW,LCSW [phone]

## 2014-12-29 NOTE — Progress Notes (Signed)
[facility] [place] [facility]   PATIENT NAME: Rachel Brady    MR#:  [id number]  DATE OF BIRTH:  Aug 16, 1945  SUBJECTIVE:    Placed back o vent. Vitals stable.. On Fentanyl gtt, awake. Wants to eat and get off the vent REVIEW OF SYSTEMS:  Review of Systems  Unable to perform ROS  DRUG ALLERGIES:   Allergies  Allergen Reactions  . Codeine     Has tolerated oxycodone in the past.   VITALS:  Blood pressure 114/74, pulse 95, temperature 98 F (36.7 C), temperature source Axillary, resp. rate 15, height  (1.702 m), weight 104.7 kg (230 lb 13.2 oz), SpO2 93 %. PHYSICAL EXAMINATION:  Physical Exam  Constitutional: She is well-developed, well-nourished, and in no distress.appears chronically ill  HENT:  Head: Normocephalic and atraumatic.  Trach in place  Eyes: Conjunctivae are normal. Pupils are equal, round, and reactive to light.  Neck: Normal range of motion. Neck supple. No tracheal deviation present. No thyromegaly present.  Cardiovascular: Normal rate, regular rhythm and normal heart sounds.   Pulmonary/Chest: Effort normal and breath sounds normal. No respiratory distress. She has no wheezes. She exhibits no tenderness Abdominal: Soft. Bowel sounds are normal. She exhibits no distension. There is no tenderness.  Musculoskeletal: Normal range of motion.  Neurological: No cranial nerve deficit.  Skin: Skin is warm and dry. No rash noted.  Psychiatric: Mood and affect normal.   LABORATORY PANEL:   CBC  Recent Labs Lab 12/29/14 0520  WBC 6.3  HGB 9.4*  HCT 29.7*  PLT 153   ------------------------------------------------------------------------------------------------------------------ Chemistries   Recent Labs Lab 12/29/14 0520  NA 144  K 3.8  CL 104  CO2 35*  GLUCOSE 80  BUN 26*  CREATININE 1.20*  CALCIUM 11.7*   RADIOLOGY:  Dg Chest 1 View  12/29/2014   CLINICAL DATA:  Dyspnea.  EXAM: CHEST  1 VIEW  COMPARISON:   12/28/2014 and 12/25/2014  FINDINGS: Left-sided PICC line and tracheostomy tube unchanged. Median sternotomy wires and prosthetic heart valve unchanged.  Lungs are somewhat hypoinflated and demonstrate continued moderate opacification over the left mid to lower lung/retrocardiac region without significant change likely moderate size effusion with associated compressive atelectasis. Evidence of a small right pleural effusion without significant change. Cannot exclude infection in the lung bases. Prominence of the perihilar markings slightly improved likely mild interstitial edema. Moderate cardiomegaly which is stable. Remainder of the exam is unchanged.  IMPRESSION: Moderate stable cardiomegaly with slightly improved interstitial edema. Stable small right pleural effusion. Evidence of stable moderate left pleural effusion and associated atelectasis. Cannot exclude infection in the lung bases.   Electronically Signed   By: Elberta Fortis M.D.   On: 12/29/2014 08:18   ASSESSMENT AND PLAN:   69 year old female with a history of tracheostomy, atrial fibrillation, diabetes who presents with sepsis.  1. Sepsis with hypotension: secondary to urinary tract infection with ESBL ecoli Hypotension resolved  Appreciate ID input. Abx changed to Meropenem day #12/14 Continue meropenem for a total of 14 days until 01/01/2015 as per ID recommendations. 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 ESBL E.coli Urinary tract infection: Meropenem as above    3. Healthcare acquired pneumonia:  Continue meropenem as trach suction sputum growing Pseudomonas & Citrobacter and MRSA  4. Acute respiratory failure: acute on chronic -pt was tolerating trach collar and apparently code blue was called. Likely  had mucous plug. Now stable on the vent. Vent wean per ICU pulm -may need to go to facility with chronic vent mnx  5. Atrial  fibrillation: Her heart rate is well controlled at this time  6. Diabetes:   Sliding scale insulin blood glucose oral stable   7. Depression/anxiety: Anxiolytics as needed basis  8. Fungal skin infection: continue nystatin.  9. Nutrition- oral intake once off vent  Spoke with son Callie Fielding, long term prognosis poor D/w dr Darlin Priestly  CODE STATUS: Full code  TOTAL critical TIME  TAKING CARE OF THIS PATIENT: 35 minutes.   PATEL,SONA M.D on 12/29/2014 at 1:58 PM  Between 7am to 6pm - Pager - [phone]  After 6pm go to [email or web address] - password [email or web address] [facility]  [facility]  Office  [phone]  CC: Primary care physician; Dorothey Baseman, MD

## 2014-12-29 NOTE — Progress Notes (Signed)
Patient has on bilateral mitts. There is no where to chart this in epic.

## 2014-12-30 DIAGNOSIS — Z515 Encounter for palliative care: Secondary | ICD-10-CM

## 2014-12-30 DIAGNOSIS — Z915 Personal history of self-harm: Secondary | ICD-10-CM

## 2014-12-30 DIAGNOSIS — E119 Type 2 diabetes mellitus without complications: Secondary | ICD-10-CM

## 2014-12-30 DIAGNOSIS — I129 Hypertensive chronic kidney disease with stage 1 through stage 4 chronic kidney disease, or unspecified chronic kidney disease: Secondary | ICD-10-CM

## 2014-12-30 DIAGNOSIS — A419 Sepsis, unspecified organism: Secondary | ICD-10-CM

## 2014-12-30 DIAGNOSIS — K219 Gastro-esophageal reflux disease without esophagitis: Secondary | ICD-10-CM

## 2014-12-30 DIAGNOSIS — E669 Obesity, unspecified: Secondary | ICD-10-CM

## 2014-12-30 DIAGNOSIS — I517 Cardiomegaly: Secondary | ICD-10-CM

## 2014-12-30 DIAGNOSIS — I38 Endocarditis, valve unspecified: Secondary | ICD-10-CM

## 2014-12-30 DIAGNOSIS — I4891 Unspecified atrial fibrillation: Secondary | ICD-10-CM

## 2014-12-30 DIAGNOSIS — N183 Chronic kidney disease, stage 3 (moderate): Secondary | ICD-10-CM

## 2014-12-30 DIAGNOSIS — I10 Essential (primary) hypertension: Secondary | ICD-10-CM

## 2014-12-30 DIAGNOSIS — F1910 Other psychoactive substance abuse, uncomplicated: Secondary | ICD-10-CM

## 2014-12-30 DIAGNOSIS — F329 Major depressive disorder, single episode, unspecified: Secondary | ICD-10-CM

## 2014-12-30 DIAGNOSIS — J90 Pleural effusion, not elsewhere classified: Secondary | ICD-10-CM

## 2014-12-30 DIAGNOSIS — I2510 Atherosclerotic heart disease of native coronary artery without angina pectoris: Secondary | ICD-10-CM

## 2014-12-30 DIAGNOSIS — I509 Heart failure, unspecified: Secondary | ICD-10-CM

## 2014-12-30 DIAGNOSIS — G9341 Metabolic encephalopathy: Secondary | ICD-10-CM

## 2014-12-30 DIAGNOSIS — Z8701 Personal history of pneumonia (recurrent): Secondary | ICD-10-CM

## 2014-12-30 DIAGNOSIS — G4730 Sleep apnea, unspecified: Secondary | ICD-10-CM

## 2014-12-30 DIAGNOSIS — I469 Cardiac arrest, cause unspecified: Secondary | ICD-10-CM

## 2014-12-30 DIAGNOSIS — R000 Tachycardia, unspecified: Secondary | ICD-10-CM

## 2014-12-30 DIAGNOSIS — Z8614 Personal history of Methicillin resistant Staphylococcus aureus infection: Secondary | ICD-10-CM

## 2014-12-30 LAB — BASIC METABOLIC PANEL
ANION GAP: 4 — AB (ref 5–15)
BUN: 24 mg/dL — ABNORMAL HIGH (ref 6–20)
CALCIUM: 11.5 mg/dL — AB (ref 8.9–10.3)
CO2: 35 mmol/L — ABNORMAL HIGH (ref 22–32)
Chloride: 105 mmol/L (ref 101–111)
Creatinine, Ser: 1.08 mg/dL — ABNORMAL HIGH (ref 0.44–1.00)
GFR, EST AFRICAN AMERICAN: 60 mL/min — AB (ref >60)
GFR, EST NON AFRICAN AMERICAN: 52 mL/min — AB (ref >60)
GLUCOSE: 79 mg/dL (ref 65–99)
POTASSIUM: 3.8 mmol/L (ref 3.5–5.1)
Sodium: 144 mmol/L (ref 135–145)

## 2014-12-30 LAB — GLUCOSE, CAPILLARY
GLUCOSE-CAPILLARY: 87 mg/dL (ref 65–99)
GLUCOSE-CAPILLARY: 121 mg/dL — AB (ref 65–99)
GLUCOSE-CAPILLARY: 136 mg/dL — AB (ref 65–99)
GLUCOSE-CAPILLARY: 149 mg/dL — AB (ref 65–99)

## 2014-12-30 LAB — CBC
HCT: 30.9 % — ABNORMAL LOW (ref 35.0–47.0)
Hemoglobin: 9.9 g/dL — ABNORMAL LOW (ref 12.0–16.0)
MCH: 27.9 pg (ref 26.0–34.0)
MCHC: 32.1 g/dL (ref 32.0–36.0)
MCV: 86.9 fL (ref 80.0–100.0)
PLATELETS: 141 10*3/uL — AB (ref 150–440)
RBC: 3.56 MIL/uL — AB (ref 3.80–5.20)
RDW: 21.5 % — ABNORMAL HIGH (ref 11.5–14.5)
WBC: 5.4 10*3/uL (ref 3.6–11.0)

## 2014-12-30 MED ORDER — ACETYLCYSTEINE 20 % IN SOLN
2.0000 mL | Freq: Three times a day (TID) | RESPIRATORY_TRACT | Status: DC
  Administered 2014-12-30 (×2): 4 mL via RESPIRATORY_TRACT
  Administered 2014-12-31: 2 mL via RESPIRATORY_TRACT
  Administered 2014-12-31: 4 mL via RESPIRATORY_TRACT
  Administered 2014-12-31 – 2015-01-01 (×3): 2 mL via RESPIRATORY_TRACT
  Administered 2015-01-02: 4 mL via RESPIRATORY_TRACT
  Filled 2014-12-30 (×5): qty 4

## 2014-12-30 MED ORDER — IPRATROPIUM-ALBUTEROL 0.5-2.5 (3) MG/3ML IN SOLN
3.0000 mL | Freq: Three times a day (TID) | RESPIRATORY_TRACT | Status: DC
Start: 2014-12-30 — End: 2015-01-02
  Administered 2014-12-30 – 2015-01-02 (×8): 3 mL via RESPIRATORY_TRACT
  Filled 2014-12-30 (×7): qty 3

## 2014-12-30 MED ORDER — ACETYLCYSTEINE 10 % IN SOLN: 2.0000 mL | Freq: Three times a day (TID) | RESPIRATORY_TRACT | Status: DC

## 2014-12-30 NOTE — Progress Notes (Signed)
[facility] - [place] at [facility] [place]   PATIENT NAME: Rachel Brady    MR#:  [id number]  DATE OF BIRTH:  June 17, 1945  SUBJECTIVE:   Pt is off vent. Now on trach collar, po diet initiated  REVIEW OF SYSTEMS:  Review of Systems  Unable to perform ROS  DRUG ALLERGIES:   Allergies  Allergen Reactions  . Codeine     Has tolerated oxycodone in the past.   VITALS:  Blood pressure 102/86, pulse 90, temperature 97.9 F (36.6 C), temperature source Oral, resp. rate 19, height  (1.702 m), weight 104.7 kg (230 lb 13.2 oz), SpO2 100 %. PHYSICAL EXAMINATION:  Physical Exam  Constitutional: She is well-developed, well-nourished, and in no distress.appears chronically ill  HENT:  Head: Normocephalic and atraumatic.  Trach in place  Eyes: Conjunctivae are normal. Pupils are equal, round, and reactive to light.  Neck: Normal range of motion. Neck supple. No tracheal deviation present. No thyromegaly present.  Cardiovascular: Normal rate, regular rhythm and normal heart sounds.   Pulmonary/Chest: Effort normal and breath sounds normal. No respiratory distress. She has no wheezes. She exhibits no tenderness Abdominal: Soft. Bowel sounds are normal. She exhibits no distension. There is no tenderness.  Musculoskeletal: Normal range of motion.  Neurological: No cranial nerve deficit.  Skin: Skin is warm and dry. No rash noted.  Psychiatric: Mood and affect normal.   LABORATORY PANEL:   CBC  Recent Labs Lab 12/30/14 0545  WBC 5.4  HGB 9.9*  HCT 30.9*  PLT 141*   ------------------------------------------------------------------------------------------------------------------ Chemistries   Recent Labs Lab 12/30/14 0545  NA 144  K 3.8  CL 105  CO2 35*  GLUCOSE 79  BUN 24*  CREATININE 1.08*  CALCIUM 11.5*   RADIOLOGY:  No results found. ASSESSMENT AND PLAN:   69 year old female with a history of tracheostomy, atrial fibrillation, diabetes who  presents with sepsis.  1. Sepsis with hypotension: secondary to urinary tract infection with ESBL ecoli Hypotension resolved  Appreciate ID input. Abx changed to Meropenem day #13/14 Continue meropenem for a total of 14 days until 01/01/2015 as per ID recommendations. 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 ESBL E.coli Urinary tract infection: Meropenem as above    3. Healthcare acquired pneumonia:  Continue meropenem as trach suction sputum growing Pseudomonas & Citrobacter and MRSA  4. Acute respiratory failure: acute on chronic -pt was tolerating trach collar and apparently code blue was called. Likely had mucous plug. Now stable on the vent. Vent weaned per ICU pulm -may need to go to facility with chronic vent mnx -pt now on trach collar  5. Atrial fibrillation: Her heart rate is well controlled at this time  6. Diabetes:   Sliding scale insulin blood glucose oral stable   7. Depression/anxiety: Anxiolytics as needed basis  8. Fungal skin infection: continue nystatin.  9. Nutrition- oral intake resumed  Spoke with son Rachel Brady, long term prognosis poor D/w dr Darlin Priestly  CODE STATUS: Full code  TOTAL critical TIME  TAKING CARE OF THIS PATIENT: 35 minutes.   PATEL,SONA M.D on 12/30/2014 at 1:41 PM  Between 7am to 6pm - Pager - [phone]  After 6pm go to [email or web address] - password [email or web address] [facility]  [facility]  Office  [phone]  CC: Primary care physician; Dorothey Baseman, MD

## 2014-12-30 NOTE — Progress Notes (Addendum)
ANTIBIOTIC CONSULT NOTE - FOLLOW UP   Pharmacy Consult for Meropenm Indication: ESBL UTI/Pseudomonas PNA  Allergies  Allergen Reactions  . Codeine     Has tolerated oxycodone in the past.    Patient Measurements: Height:  (170.2 cm) Weight: 230 lb 13.2 oz (104.7 kg) IBW/kg (Calculated) : 61.6   Vital Signs: Temp: 98.5 F (36.9 C) (09/21 0749) Temp Source: Oral (09/21 0749) BP: 119/81 mmHg (09/21 0800) Pulse Rate: 72 (09/21 0800) Intake/Output from previous day: 09/20 0701 - 09/21 0700 In: 439.8 [I.V.:239.8; IV Piggyback:200] Out: 750 [Urine:750] Intake/Output from this shift: Total I/O In: 119.5 [I.V.:19.5; IV Piggyback:100] Out: -   Labs:  Recent Labs  12/27/14 2252 12/29/14 0520 12/30/14 0545  WBC 4.8 6.3 5.4  HGB 9.8* 9.4* 9.9*  PLT 161 153 141*  CREATININE 1.27* 1.20* 1.08*   Estimated Creatinine Clearance: 62 mL/min (by C-G formula based on Cr of 1.08). No results for input(s): VANCOTROUGH, VANCOPEAK, VANCORANDOM, GENTTROUGH, GENTPEAK, GENTRANDOM, TOBRATROUGH, TOBRAPEAK, TOBRARND, AMIKACINPEAK, AMIKACINTROU, AMIKACIN in the last 72 hours.   Microbiology: Recent Results (from the past 720 hour(s))  Blood Culture (routine x 2)     Status: None   Collection Time: 12/13/14  9:25 AM  Result Value Ref Range Status   Specimen Description BLOOD LEFT ARM  Final   Special Requests BOTTLES DRAWN AEROBIC AND ANAEROBIC  Final   Culture  Setup Time   Final    GRAM POSITIVE COCCI IN CLUSTERS ANAEROBIC BOTTLE ONLY CRITICAL RESULT CALLED TO, READ BACK BY AND VERIFIED WITH: LESLIE LEWIS 12/14/2014 0630 [facility] CONFIRMED BY [facility]    Culture   Final    COAGULASE NEGATIVE STAPHYLOCOCCUS ANAEROBIC BOTTLE ONLY Results consistent with contamination.    Report Status 12/18/2014 FINAL  Final  Blood Culture (routine x 2)     Status: None   Collection Time: 12/13/14  9:35 AM  Result Value Ref Range Status   Specimen Description BLOOD RIGHT FORARM  Final   Special  Requests BOTTLES DRAWN AEROBIC AND ANAEROBIC  Final   Culture NO GROWTH 5 DAYS  Final   Report Status 12/18/2014 FINAL  Final  Urine culture     Status: None   Collection Time: 12/13/14  9:35 AM  Result Value Ref Range Status   Specimen Description URINE, RANDOM  Final   Special Requests NONE  Final   Culture   Final    >=100,000 COLONIES/mL ESCHERICHIA COLI ESBL-EXTENDED SPECTRUM BETA LACTAMASE-THE ORGANISM IS RESISTANT TO PENICILLINS, CEPHALOSPORINS AND AZTREONAM ACCORDING TO CLSI M100-S15 VOL.25 N01 JAN 2005. Results Called to: Derald Macleod AT 1119 12/15/14 CTJ    Report Status 12/15/2014 FINAL  Final   Organism ID, Bacteria ESCHERICHIA COLI  Final      Susceptibility   Escherichia coli - MIC*    AMPICILLIN >=32 RESISTANT Resistant     CEFTAZIDIME 16 RESISTANT Resistant     CEFAZOLIN >=64 RESISTANT Resistant     CEFTRIAXONE 32 RESISTANT Resistant     GENTAMICIN <=1 SENSITIVE Sensitive     IMIPENEM <=0.25 SENSITIVE Sensitive     TRIMETH/SULFA >=320 RESISTANT Resistant     Extended ESBL POSITIVE Resistant     CIPROFLOXACIN Value in next row Sensitive      SENSITIVE<=0.25    NITROFURANTOIN Value in next row Sensitive      SENSITIVE<=16    PIP/TAZO Value in next row Resistant      RESISTANT>=128    ERTAPENEM Value in next row Sensitive  SENSITIVE<=0.5    * >=100,000 COLONIES/mL ESCHERICHIA COLI  MRSA PCR Screening     Status: Abnormal   Collection Time: 12/13/14 12:20 PM  Result Value Ref Range Status   MRSA by PCR POSITIVE (A) NEGATIVE Final    Comment:        The GeneXpert MRSA Assay (FDA approved for NASAL specimens only), is one component of a comprehensive MRSA colonization surveillance program. It is not intended to diagnose MRSA infection nor to guide or monitor treatment for MRSA infections. CRITICAL RESULT CALLED TO, READ BACK BY AND VERIFIED WITH: ADAM SCARBORO  ON 12/13/14 BY HP   Culture, expectorated sputum-assessment     Status: None    Collection Time: 12/15/14 10:49 AM  Result Value Ref Range Status   Specimen Description ENDOTRACHEAL  Final   Special Requests NONE  Final   Sputum evaluation THIS SPECIMEN IS ACCEPTABLE FOR SPUTUM CULTURE  Final   Report Status 12/16/2014 FINAL  Final  Culture, respiratory (NON-Expectorated)     Status: None   Collection Time: 12/15/14 10:49 AM  Result Value Ref Range Status   Specimen Description ENDOTRACHEAL  Final   Special Requests NONE Reflexed from [id number]  Final   Gram Stain   Final    FEW WBC SEEN FEW GRAM NEGATIVE RODS FEW GRAM POSITIVE COCCI IN PAIRS FAIR SPECIMEN - 70-80% WBCS    Culture   Final    MODERATE GROWTH CITROBACTER SPECIES HEAVY GROWTH PSEUDOMONAS AERUGINOSA MULTI-DRUG RESISTANT ORGANISM CRITICAL RESULT CALLED TO, READ BACK BY AND VERIFIED WITH: [facility] BORBA 12/18/14 1042 JGF    Report Status 12/19/2014 FINAL  Final   Organism ID, Bacteria PSEUDOMONAS AERUGINOSA  Final   Organism ID, Bacteria CITROBACTER SPECIES  Final      Susceptibility   Pseudomonas aeruginosa - MIC*    CEFTAZIDIME >=64 RESISTANT Resistant     CIPROFLOXACIN >=4 RESISTANT Resistant     GENTAMICIN >=16 RESISTANT Resistant     IMIPENEM 1 SENSITIVE Sensitive     PIP/TAZO Value in next row Resistant      RESISTANT>=128    * HEAVY GROWTH PSEUDOMONAS AERUGINOSA   Citrobacter species - MIC*    PIP/TAZO Value in next row Resistant      RESISTANT>=128    CEFTAZIDIME Value in next row Sensitive      SENSITIVE<=4    CEFEPIME Value in next row Sensitive      SENSITIVE<=1    CEFAZOLIN Value in next row Resistant      RESISTANT>=64    CEFTRIAXONE Value in next row Sensitive      SENSITIVE8    CIPROFLOXACIN Value in next row Sensitive      SENSITIVE<=0.25    GENTAMICIN Value in next row Sensitive      SENSITIVE<=1    IMIPENEM Value in next row Sensitive      SENSITIVE<=0.25    TRIMETH/SULFA Value in next row Sensitive      SENSITIVE<=20    * MODERATE GROWTH CITROBACTER SPECIES   Culture, respiratory (NON-Expectorated)     Status: None   Collection Time: 12/23/14 12:00 PM  Result Value Ref Range Status   Specimen Description SPUTUM  Final   Special Requests NONE  Final   Gram Stain   Final    MODERATE WBC SEEN FEW GRAM NEGATIVE RODS EXCELLENT SPECIMEN - 90-100% WBCS    Culture   Final    HEAVY GROWTH PSEUDOMONAS AERUGINOSA This organism isolate is resistant to one or more antiotic agents in three  or more antimicrobial categories.  Suggest Infectious Disease consult.   CRITICAL RESULT CALLED TO, READ BACK BY AND VERIFIED WITH: PAM CRAWFORD AT 1047 12/25/14 DV    Report Status 12/25/2014 FINAL  Final   Organism ID, Bacteria PSEUDOMONAS AERUGINOSA  Final      Susceptibility   Pseudomonas aeruginosa - MIC*    CEFTAZIDIME 16 INTERMEDIATE Intermediate     CIPROFLOXACIN >=4 RESISTANT Resistant     GENTAMICIN >=16 RESISTANT Resistant     IMIPENEM >=16 RESISTANT Resistant     PIP/TAZO Value in next row Sensitive      SENSITIVE32    * HEAVY GROWTH PSEUDOMONAS AERUGINOSA    Medical History: Past Medical History  Diagnosis Date  . Tracheostomy in place   . Diabetes   . CKD (chronic kidney disease)   . OSA (obstructive sleep apnea)   . HTN (hypertension)   . MDD (major depressive disorder)     Medications:  Scheduled:  . apixaban  5 mg Oral BID  . atorvastatin  80 mg Oral q1800  . bisacodyl  10 mg Rectal QODAY  . carvedilol  6.25 mg Oral BID  . escitalopram  5 mg Oral Daily  . famotidine  40 mg Oral Daily  . feeding supplement (GLUCERNA SHAKE)  237 mL Oral TID WC  . furosemide  40 mg Oral Daily  . insulin aspart  0-5 Units Subcutaneous QHS  . insulin aspart  0-9 Units Subcutaneous TID WC  . ipratropium-albuterol  3 mL Nebulization QID  . isosorbide dinitrate  30 mg Oral Daily  . megestrol  400 mg Oral Daily  . meropenem (MERREM) IV  1 g Intravenous 3 times per day  . multivitamin with minerals  1 tablet Oral Daily  . QUEtiapine  50 mg Oral QHS  .  senna-docusate  1 tablet Oral BID  . sodium chloride  10-40 mL Intracatheter Q12H   Infusions:  . sodium chloride 10 mL/hr at 12/30/14 0800  . fentaNYL infusion INTRAVENOUS Stopped (12/29/14 1900)   PRN: acetaminophen **OR** acetaminophen, alum & mag hydroxide-simeth, fentaNYL, hydrALAZINE, Influenza vac split quadrivalent PF, ipratropium-albuterol, ondansetron **OR** ondansetron (ZOFRAN) IV, pneumococcal 23 valent vaccine, sodium chloride  Assessment: 69 y/o F on day 12 of meropenem for ESBL E coli as well as Pseudomonas/Citrobacter PNA.  Patient will need to continue therapy through 09/23 per ID.   Plan:  Will continue Merrem 1 g IV q8 hours.   Rachel Brady D 12/30/2014,9:20 AM

## 2014-12-30 NOTE — Care Management Note (Addendum)
Case Management Note  Patient Details  Name: Rachel Brady MRN: [id number] Date of Birth: November 07, 1945  Subjective/Objective:   Discussed continued care needs in progression. Patient will need a vent snf per Dr. Ardyth Man due to high potential for need for vent on and off. Continuing to follow progression with CSW.                  Action/Plan: SNF pending   Expected Discharge Date:                  Expected Discharge Plan:  Skilled Nursing Facility  In-House Referral:  Clinical Social Work  Discharge planning Services  CM Consult  Post Acute Care Choice:  SNF Choice offered to:  Adult Children  DME Arranged:    DME Agency:     HH Arranged:    HH Agency:     Status of Service:  In process, will continue to follow  Medicare Important Message Given:  Yes-second notification given Date Medicare IM Given:    Medicare IM give by:    Date Additional Medicare IM Given:    Additional Medicare Important Message give by:     If discussed at Long Length of Stay Meetings, dates discussed:    Additional Comments:  Marily Memos, RN 12/30/2014, 9:13 AM

## 2014-12-30 NOTE — Progress Notes (Signed)
[facility] Critical Care Medicine Progess Note    ASSESSMENT/PLAN   69 yo obese white female with acute on chronic resp failure s/p trach with acute resp failure and acute cardiac arrest, now with left lower lobe pneumonia.   IMPRESSION:  Acute respiratory failure, vent dependence with mucous plug, left lower lobe atelectasis, left lower lobe pneumonia.  -The patient has been weaned off of the ventilator. She is currently speaking with the speaking valve, she is being maintained on Trach collar currently. It appears that her mental status is improved to nearly baseline. We'll maintain the patient on trach collar during the day and put her back on the ventilator overnight and continue to wean as tolerated.  Altered mental status, suspect secondary to metabolic encephalopathy. -Mental status is improved this morning.  Chronic trach dependence, with chronic respiratory failure. -Continue to wean from the ventilator as tolerated. -Chronic hypercapnic respiratory failure, likely due to COPD, emphysema, possibly obesity hypoventilation.  Acute on chronic diastolic congestive heart failure. -Continue furosemide at 20 mg once daily.  Pulmonary hypertension, likely group 2 and/or group. 3. Likely pulm edema/CHF-will obtain echo ESBL E coli UTI  Psedomonas/citrobacter tracheobronchitis vs PNA -Continue broad-spectrum antibiotics, infectious disease services, following.  Coronary artery disease, S/P CABG x 4 -Continue current regimen.  S/P aortic valve replacement with bioprosthetic valve -Currently on apixaban .   Nutrition. -Continue to advance diet as tolerated. -Continue Pepcid for GI prophylaxis.  Case discussed with critical care RN  and nutrition. We'll continue the weaning the vent as tolerated. I'm uncertain at this point as to whether the patient will require ventilator at night. In the long-term. For now, we'll work on getting the patient transferred to a vent care  facility. ---------------------------------------  CXR 12/29/14 image reviewed; continued LLL atelectasis/pneumonia.  ---------------------------------------- Case was discussed with the critical care RN, nutrition services, critical care pharmacist.  Name: JALEIGH MCCROSKEY MRN: [id number] DOB: 03-25-1946    ADMISSION DATE:  12/13/2014    CHIEF COMPLAINT: Dyspnea   STUDIES:  --   SUBJECTIVE:   Patient is sleepy but arousable on the ventilator.  Review of Systems:  Pt currently on the ventilator, can not provide history or review of systems.    VITAL SIGNS: Temp:  [97.6 F (36.4 C)-98.5 F (36.9 C)] 98.5 F (36.9 C) (09/21 0749) Pulse Rate:  [72-114] 72 (09/21 0800) Resp:  [13-24] 20 (09/21 0800) BP: (98-146)/(67-92) 119/81 mmHg (09/21 0800) SpO2:  [89 %-100 %] 100 % (09/21 0800) FiO2 (%):  [40 %] 40 % (09/21 0800) Weight:  [104.7 kg (230 lb 13.2 oz)] 104.7 kg (230 lb 13.2 oz) (09/21 0416) HEMODYNAMICS:   VENTILATOR SETTINGS: Vent Mode:  [-]  FiO2 (%):  [40 %] 40 % INTAKE / OUTPUT:  Intake/Output Summary (Last 24 hours) at 12/30/14 1047 Last data filed at 12/30/14 0800  Gross per 24 hour  Intake 559.33 ml  Output    750 ml  Net -190.67 ml    PHYSICAL EXAMINATION: Physical Examination:   VS: BP 119/81 mmHg  Pulse 72  Temp(Src) 98.5 F (36.9 C) (Oral)  Resp 20  Ht  (1.702 m)  Wt 104.7 kg (230 lb 13.2 oz)  BMI 36.14 kg/m2  SpO2 100%  General Appearance: No distress  Neuro:without focal findings, lethargic but arousable. HEENT: PERRLA, EOM intact. Pulmonary: normal breath sounds   CardiovascularNormal S1,S2.  No m/r/g.   Abdomen: Benign, Soft, non-tender. Renal:  No costovertebral tenderness  GU:  Not performed at this time.  Endocrine: No evident thyromegaly. Skin:   warm, no rashes, no ecchymosis  Extremities: normal, no cyanosis, clubbing.   LABS:   LABORATORY PANEL:   CBC  Recent Labs Lab 12/30/14 0545  WBC 5.4  HGB 9.9*  HCT  30.9*  PLT 141*    Chemistries   Recent Labs Lab 12/30/14 0545  NA 144  K 3.8  CL 105  CO2 35*  GLUCOSE 79  BUN 24*  CREATININE 1.08*  CALCIUM 11.5*     Recent Labs Lab 12/28/14 2107 12/29/14 0751 12/29/14 1150 12/29/14 1558 12/29/14 2127 12/30/14 0717  GLUCAP 80 80 84 93 82 87    Recent Labs Lab 12/27/14 2248 12/29/14 0419  PHART 7.35 7.54*  PCO2ART 57* 45  PO2ART 63* 80*   No results for input(s): AST, ALT, ALKPHOS, BILITOT, ALBUMIN in the last 168 hours.  Cardiac Enzymes  Recent Labs Lab 12/28/14 1208  TROPONINI 0.08*    RADIOLOGY:  Dg Chest 1 View  12/29/2014   CLINICAL DATA:  Dyspnea.  EXAM: CHEST  1 VIEW  COMPARISON:  12/28/2014 and 12/25/2014  FINDINGS: Left-sided PICC line and tracheostomy tube unchanged. Median sternotomy wires and prosthetic heart valve unchanged.  Lungs are somewhat hypoinflated and demonstrate continued moderate opacification over the left mid to lower lung/retrocardiac region without significant change likely moderate size effusion with associated compressive atelectasis. Evidence of a small right pleural effusion without significant change. Cannot exclude infection in the lung bases. Prominence of the perihilar markings slightly improved likely mild interstitial edema. Moderate cardiomegaly which is stable. Remainder of the exam is unchanged.  IMPRESSION: Moderate stable cardiomegaly with slightly improved interstitial edema. Stable small right pleural effusion. Evidence of stable moderate left pleural effusion and associated atelectasis. Cannot exclude infection in the lung bases.   Electronically Signed   By: Elberta Fortis M.D.   On: 12/29/2014 08:18       --Wells Guiles, MD.  Pager [phone] [facility] Office Number: [phone]  Santiago Glad, M.D.  Stephanie Acre, M.D.  Billy Fischer, M.D  Critical Care Attestation.  I have personally obtained a history, examined the patient, evaluated  laboratory and imaging results, formulated the assessment and plan and placed orders. The Patient requires high complexity decision making for assessment and support, frequent evaluation and titration of therapies, application of advanced monitoring technologies and extensive interpretation of multiple databases. The patient has critical illness that could lead imminently to failure of 1 or more organ systems and requires the highest level of physician preparedness to intervene.  Critical Care Time devoted to patient care services described in this note is 35 minutes and is exclusive of time spent in procedures.

## 2014-12-30 NOTE — Care Management Important Message (Signed)
Important Message  Patient Details  Name: Rachel Brady MRN: [id number] Date of Birth: [birth year]   Medicare Important Message Given:  Yes-third notification given    Marily Memos, RN 12/30/2014, 11:45 AM

## 2014-12-30 NOTE — Progress Notes (Signed)
Nutrition Follow-up       INTERVENTION:  Meals and snacks: Cater to pt preferences Medical Nutrition Supplement: continue glucerna TID for added nutrition   NUTRITION DIAGNOSIS:   Inadequate oral intake related to acute illness as evidenced by NPO status, off vent and tolerating po diet and supplements    GOAL:   Patient will meet greater than or equal to 90% of their needs  improving  MONITOR:    (Energy Intake, Pulmonary, Digestive System, Anthropometrics, Electrolyte/Renal Profile, Glucose Profile)  REASON FOR ASSESSMENT:   Consult Enteral/tube feeding initiation and management  ASSESSMENT:      Pt weaned from vent to trach collar yesterday.     Current Nutrition: Per RN, Pam drank 1/3 of glucerna this am, ate most of applesauce, 20% of breakfast tray.  Using speaking valve. Limited nutrition 3 days (9/18-9/20) secondary to on vent   Gastrointestinal Profile: Last BM: 9/18   Medications: reveiewed  Electrolyte/Renal Profile and Glucose Profile:   Recent Labs Lab 12/27/14 2252 12/29/14 0520 12/30/14 0545  NA 142 144 144  K 4.1 3.8 3.8  CL 102 104 105  CO2 32 35* 35*  BUN 27* 26* 24*  CREATININE 1.27* 1.20* 1.08*  CALCIUM 11.7* 11.7* 11.5*  GLUCOSE 211* 80 79      Weight Trend since Admission: Filed Weights   12/28/14 0419 12/29/14 0450 12/30/14 0416  Weight: 231 lb 0.7 oz (104.8 kg) 230 lb 13.2 oz (104.7 kg) 230 lb 13.2 oz (104.7 kg)      Diet Order:  DIET DYS 2 Room service appropriate?: Yes; Fluid consistency:: Thin  Skin:   (stage II buttock)   Height:   Ht Readings from Last 1 Encounters:  12/13/14  (1.702 m)    Weight:   Wt Readings from Last 1 Encounters:  12/30/14 230 lb 13.2 oz (104.7 kg)    BMI:  Body mass index is 36.14 kg/(m^2).  Estimated Nutritional Needs:   Kcal:  1610-9604 kcals (11-14 kcals/kg)   Protein:  122-153 g (2.0-2.5 g/kg) using IBW 61 kg  Fluid:  1525-1830 mL (25-30 ml/kg)   EDUCATION  NEEDS:   No education needs identified at this time  HIGH Care Level  Joli B. Freida Busman, RD, LDN [phone] (pager)

## 2014-12-30 NOTE — Plan of Care (Signed)
Problem: Phase I Progression Outcomes Goal: Initial discharge plan identified Outcome: Progressing Plans for SNF placement.  Problem: Phase II Progression Outcomes Goal: Maintain Patent Airway Outcome: Progressing Secretions are thick tan but infrequent.  Mucomyst added.

## 2014-12-30 NOTE — Progress Notes (Signed)
Palliative Care Update   Palliative Care Consult is initiated.  Discussions to be deferred to tomorrow due to late hour. Full code status and wishes will be brought up and discussed.    See Full Note to follow.  Mariane Masters, MD

## 2014-12-30 NOTE — Progress Notes (Signed)
Pt is alert and confused.  Feeds self with tray set up. Sats good on 30% trach collar. Lungs coarse rhonchi. Secretions are infrequent but thick tan/yellow.  Mucomyst added. Palliative care will follow up tomorrow. No visitors of phone calls.

## 2014-12-31 ENCOUNTER — Inpatient Hospital Stay: Payer: Medicare Other

## 2014-12-31 LAB — CBC
HEMATOCRIT: 30.4 % — AB (ref 35.0–47.0)
HEMOGLOBIN: 9.4 g/dL — AB (ref 12.0–16.0)
MCH: 26.7 pg (ref 26.0–34.0)
MCHC: 31 g/dL — AB (ref 32.0–36.0)
MCV: 86.2 fL (ref 80.0–100.0)
Platelets: 125 10*3/uL — ABNORMAL LOW (ref 150–440)
RBC: 3.53 MIL/uL — ABNORMAL LOW (ref 3.80–5.20)
RDW: 20.3 % — AB (ref 11.5–14.5)
WBC: 4 10*3/uL (ref 3.6–11.0)

## 2014-12-31 LAB — BASIC METABOLIC PANEL
ANION GAP: 6 (ref 5–15)
BUN: 25 mg/dL — ABNORMAL HIGH (ref 6–20)
CALCIUM: 11.9 mg/dL — AB (ref 8.9–10.3)
CO2: 36 mmol/L — ABNORMAL HIGH (ref 22–32)
Chloride: 103 mmol/L (ref 101–111)
Creatinine, Ser: 1.06 mg/dL — ABNORMAL HIGH (ref 0.44–1.00)
GFR calc non Af Amer: 53 mL/min — ABNORMAL LOW (ref >60)
Glucose, Bld: 154 mg/dL — ABNORMAL HIGH (ref 65–99)
Potassium: 3.8 mmol/L (ref 3.5–5.1)
SODIUM: 145 mmol/L (ref 135–145)

## 2014-12-31 LAB — GLUCOSE, CAPILLARY
GLUCOSE-CAPILLARY: 122 mg/dL — AB (ref 65–99)
GLUCOSE-CAPILLARY: 112 mg/dL — AB (ref 65–99)
GLUCOSE-CAPILLARY: 63 mg/dL — AB (ref 65–99)
GLUCOSE-CAPILLARY: 68 mg/dL (ref 65–99)
GLUCOSE-CAPILLARY: 98 mg/dL (ref 65–99)
GLUCOSE-CAPILLARY: 80 mg/dL (ref 65–99)
Glucose-Capillary: 74 mg/dL (ref 65–99)

## 2014-12-31 MED ORDER — FUROSEMIDE 10 MG/ML IJ SOLN
40.0000 mg | Freq: Once | INTRAMUSCULAR | Status: AC
  Administered 2014-12-31: 40 mg via INTRAVENOUS
  Filled 2014-12-31: qty 4

## 2014-12-31 MED ORDER — ENOXAPARIN SODIUM 120 MG/0.8ML ~~LOC~~ SOLN: 1.0000 mg/kg | Freq: Two times a day (BID) | SUBCUTANEOUS | Status: DC

## 2014-12-31 MED ORDER — DEXTROSE 50 % IV SOLN
INTRAVENOUS | Status: AC
  Administered 2014-12-31: 25 mL via INTRAVENOUS
  Filled 2014-12-31: qty 50

## 2014-12-31 MED ORDER — DEXTROSE 50 % IV SOLN
25.0000 mL | Freq: Once | INTRAVENOUS | Status: AC
  Administered 2014-12-31: 25 mL via INTRAVENOUS

## 2014-12-31 MED ORDER — ENOXAPARIN SODIUM 120 MG/0.8ML ~~LOC~~ SOLN
105.0000 mg | Freq: Two times a day (BID) | SUBCUTANEOUS | Status: DC
  Administered 2014-12-31: 105 mg via SUBCUTANEOUS
  Filled 2014-12-31 (×3): qty 0.8

## 2014-12-31 NOTE — Care Management Note (Signed)
Case Management Note  Patient Details  Name: Rachel Brady MRN: [id number] Date of Birth: 04-09-1946  Subjective/Objective:   Placed back on the vent due to respiratory distress and bradycardia this am.                 Action/Plan:   Expected Discharge Date:                  Expected Discharge Plan:  Skilled Nursing Facility  In-House Referral:  Clinical Social Work  Discharge planning Services  CM Consult  Post Acute Care Choice:  Long Term Acute Care (LTAC) Choice offered to:  Adult Children  DME Arranged:    DME Agency:     HH Arranged:    HH Agency:     Status of Service:  In process, will continue to follow  Medicare Important Message Given:  Yes-third notification given Date Medicare IM Given:    Medicare IM give by:    Date Additional Medicare IM Given:    Additional Medicare Important Message give by:     If discussed at Long Length of Stay Meetings, dates discussed:    Additional Comments:  Marily Memos, RN 12/31/2014, 9:48 AM

## 2014-12-31 NOTE — Progress Notes (Signed)
[facility] - [place] at [facility]   PATIENT NAME: Rachel Brady    MR#:  [id number]  DATE OF BIRTH:  Jan 19, 1946  SUBJECTIVE: Patient was tachypneic with 30-40 bpm, hypoxic with the 90% sats on her present tract collar. She also was using accessory muscles of respiration this a.m. So patient put back on the vent. Also bradycardia noted, patient had a brief episode of asystole but did not require CPR. At that time she was getting the suppository by RN. Still has episodes of bradycardia.    REVIEW OF SYSTEMS:  Review of Systems  Unable to perform ROS  DRUG ALLERGIES:   Allergies  Allergen Reactions  . Codeine     Has tolerated oxycodone in the past.   VITALS:  Blood pressure 116/74, pulse 75, temperature 98.4 F (36.9 C), temperature source Oral, resp. rate 17, height  (1.702 m), weight 102.7 kg (226 lb 6.6 oz), SpO2 96 %. PHYSICAL EXAMINATION:  Physical Exam  Constitutional: critically ill ,patient is  On full vent support Head: Normocephalic and atraumatic.  Trach in place  Eyes: Conjunctivae are normal. Pupils are equal, round, and reactive to light.  Neck: Normal range of motion. Neck supple. No tracheal deviation present. No thyromegaly present.  trach collar is connected to vent. Cardiovascular: Normal rate, regular rhythm and normal heart sounds.   Pulmonary/Chest: Bilateral breath sounds present  N She has no wheezes.  Abdominal: Soft. Bowel sounds are normal. She exhibits no distension. There is no tenderness.  Musculoskeletal: Normal range of motion.  Neurological: No cranial nerve deficit.  Skin: Skin is warm and dry. No rash noted.  Psychiatric: Mood and affect normal.   LABORATORY PANEL:   CBC  Recent Labs Lab 12/31/14 0352  WBC 4.0  HGB 9.4*  HCT 30.4*  PLT 125*   ------------------------------------------------------------------------------------------------------------------ Chemistries   Recent Labs Lab  12/31/14 0352  NA 145  K 3.8  CL 103  CO2 36*  GLUCOSE 154*  BUN 25*  CREATININE 1.06*  CALCIUM 11.9*   RADIOLOGY:  No results found. ASSESSMENT AND PLAN:   69 year old female with a history of tracheostomy, atrial fibrillation, diabetes who presents with sepsis.  1. Sepsis with hypotension: secondary to urinary tract infection with ESBL ecoli Hypotension resolved  Appreciate ID input. Abx changed to Meropenem day #14/14 Continue meropenem for a total of 14 days until 01/01/2015 as per ID recommendations. 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 ESBL E.coli Urinary tract infection: Meropenem as above    3. Healthcare acquired pneumonia: Patient supposed to be on vent at night according to the records.  We will have to put her on vent at nighttime after this episode resolves. Will the weaning off from vent tomorrow.  Continue meropenem as trach suction sputum growing Pseudomonas & Citrobacter and MRSA  4. Acute respiratory failure: acute on chronic  -5. Atrial fibrillation: Riccardi at this morning. Hold the Coreg. Consult cardiology. Continue  ELiquis. 6. Diabetes:   Sliding scale insulin    7. Depression/anxiety: Anxiolytics as needed basis  8. Fungal skin infection: continue nystatin.  9. Nutrition- oral intake resumed  Spoke with son Rachel Brady, long term prognosis poor D/w dr Darlin Priestly  CODE STATUS: Full code  TOTAL critical TIME  TAKING CARE OF THIS PATIENT: 35 minutes.   Katha Hamming M.D on 12/31/2014 at 12:15 PM  Between 7am to 6pm - Pager - [phone]  After 6pm go to [email or web address] - password [email or web address] [facility]  [facility]  Office  [phone]  CC: Primary care physician; Dorothey Baseman, MD

## 2014-12-31 NOTE — Progress Notes (Signed)
ANTIBIOTIC CONSULT NOTE - FOLLOW UP   Pharmacy Consult for Meropenm Indication: ESBL UTI/Pseudomonas PNA  Allergies  Allergen Reactions  . Codeine     Has tolerated oxycodone in the past.    Patient Measurements: Height:  (170.2 cm) Weight: 226 lb 6.6 oz (102.7 kg) IBW/kg (Calculated) : 61.6   Vital Signs: Temp: 98.4 F (36.9 C) (09/22 1200) Temp Source: Oral (09/22 1200) BP: 116/74 mmHg (09/22 1200) Pulse Rate: 75 (09/22 1200) Intake/Output from previous day: 09/21 0701 - 09/22 0700 In: 1369.5 [P.O.:720; I.V.:249.5; IV Piggyback:400] Out: 950 [Urine:950] Intake/Output from this shift: Total I/O In: 86.8 [I.V.:86.8] Out: -   Labs:  Recent Labs  12/29/14 0520 12/30/14 0545 12/31/14 0352  WBC 6.3 5.4 4.0  HGB 9.4* 9.9* 9.4*  PLT 153 141* 125*  CREATININE 1.20* 1.08* 1.06*   Estimated Creatinine Clearance: 62.5 mL/min (by C-G formula based on Cr of 1.06). No results for input(s): VANCOTROUGH, VANCOPEAK, VANCORANDOM, GENTTROUGH, GENTPEAK, GENTRANDOM, TOBRATROUGH, TOBRAPEAK, TOBRARND, AMIKACINPEAK, AMIKACINTROU, AMIKACIN in the last 72 hours.   Microbiology: Recent Results (from the past 720 hour(s))  Blood Culture (routine x 2)     Status: None   Collection Time: 12/13/14  9:25 AM  Result Value Ref Range Status   Specimen Description BLOOD LEFT ARM  Final   Special Requests BOTTLES DRAWN AEROBIC AND ANAEROBIC  Final   Culture  Setup Time   Final    GRAM POSITIVE COCCI IN CLUSTERS ANAEROBIC BOTTLE ONLY CRITICAL RESULT CALLED TO, READ BACK BY AND VERIFIED WITH: LESLIE LEWIS 12/14/2014 0630 [facility] CONFIRMED BY [facility]    Culture   Final    COAGULASE NEGATIVE STAPHYLOCOCCUS ANAEROBIC BOTTLE ONLY Results consistent with contamination.    Report Status 12/18/2014 FINAL  Final  Blood Culture (routine x 2)     Status: None   Collection Time: 12/13/14  9:35 AM  Result Value Ref Range Status   Specimen Description BLOOD RIGHT FORARM  Final   Special Requests  BOTTLES DRAWN AEROBIC AND ANAEROBIC  Final   Culture NO GROWTH 5 DAYS  Final   Report Status 12/18/2014 FINAL  Final  Urine culture     Status: None   Collection Time: 12/13/14  9:35 AM  Result Value Ref Range Status   Specimen Description URINE, RANDOM  Final   Special Requests NONE  Final   Culture   Final    >=100,000 COLONIES/mL ESCHERICHIA COLI ESBL-EXTENDED SPECTRUM BETA LACTAMASE-THE ORGANISM IS RESISTANT TO PENICILLINS, CEPHALOSPORINS AND AZTREONAM ACCORDING TO CLSI M100-S15 VOL.25 N01 JAN 2005. Results Called to: Derald Macleod AT 1119 12/15/14 CTJ    Report Status 12/15/2014 FINAL  Final   Organism ID, Bacteria ESCHERICHIA COLI  Final      Susceptibility   Escherichia coli - MIC*    AMPICILLIN >=32 RESISTANT Resistant     CEFTAZIDIME 16 RESISTANT Resistant     CEFAZOLIN >=64 RESISTANT Resistant     CEFTRIAXONE 32 RESISTANT Resistant     GENTAMICIN <=1 SENSITIVE Sensitive     IMIPENEM <=0.25 SENSITIVE Sensitive     TRIMETH/SULFA >=320 RESISTANT Resistant     Extended ESBL POSITIVE Resistant     CIPROFLOXACIN Value in next row Sensitive      SENSITIVE<=0.25    NITROFURANTOIN Value in next row Sensitive      SENSITIVE<=16    PIP/TAZO Value in next row Resistant      RESISTANT>=128    ERTAPENEM Value in next row Sensitive  SENSITIVE<=0.5    * >=100,000 COLONIES/mL ESCHERICHIA COLI  MRSA PCR Screening     Status: Abnormal   Collection Time: 12/13/14 12:20 PM  Result Value Ref Range Status   MRSA by PCR POSITIVE (A) NEGATIVE Final    Comment:        The GeneXpert MRSA Assay (FDA approved for NASAL specimens only), is one component of a comprehensive MRSA colonization surveillance program. It is not intended to diagnose MRSA infection nor to guide or monitor treatment for MRSA infections. CRITICAL RESULT CALLED TO, READ BACK BY AND VERIFIED WITH: ADAM SCARBORO  ON 12/13/14 BY HP   Culture, expectorated sputum-assessment     Status: None    Collection Time: 12/15/14 10:49 AM  Result Value Ref Range Status   Specimen Description ENDOTRACHEAL  Final   Special Requests NONE  Final   Sputum evaluation THIS SPECIMEN IS ACCEPTABLE FOR SPUTUM CULTURE  Final   Report Status 12/16/2014 FINAL  Final  Culture, respiratory (NON-Expectorated)     Status: None   Collection Time: 12/15/14 10:49 AM  Result Value Ref Range Status   Specimen Description ENDOTRACHEAL  Final   Special Requests NONE Reflexed from [id number]  Final   Gram Stain   Final    FEW WBC SEEN FEW GRAM NEGATIVE RODS FEW GRAM POSITIVE COCCI IN PAIRS FAIR SPECIMEN - 70-80% WBCS    Culture   Final    MODERATE GROWTH CITROBACTER SPECIES HEAVY GROWTH PSEUDOMONAS AERUGINOSA MULTI-DRUG RESISTANT ORGANISM CRITICAL RESULT CALLED TO, READ BACK BY AND VERIFIED WITH: [facility] BORBA 12/18/14 1042 JGF    Report Status 12/19/2014 FINAL  Final   Organism ID, Bacteria PSEUDOMONAS AERUGINOSA  Final   Organism ID, Bacteria CITROBACTER SPECIES  Final      Susceptibility   Pseudomonas aeruginosa - MIC*    CEFTAZIDIME >=64 RESISTANT Resistant     CIPROFLOXACIN >=4 RESISTANT Resistant     GENTAMICIN >=16 RESISTANT Resistant     IMIPENEM 1 SENSITIVE Sensitive     PIP/TAZO Value in next row Resistant      RESISTANT>=128    * HEAVY GROWTH PSEUDOMONAS AERUGINOSA   Citrobacter species - MIC*    PIP/TAZO Value in next row Resistant      RESISTANT>=128    CEFTAZIDIME Value in next row Sensitive      SENSITIVE<=4    CEFEPIME Value in next row Sensitive      SENSITIVE<=1    CEFAZOLIN Value in next row Resistant      RESISTANT>=64    CEFTRIAXONE Value in next row Sensitive      SENSITIVE8    CIPROFLOXACIN Value in next row Sensitive      SENSITIVE<=0.25    GENTAMICIN Value in next row Sensitive      SENSITIVE<=1    IMIPENEM Value in next row Sensitive      SENSITIVE<=0.25    TRIMETH/SULFA Value in next row Sensitive      SENSITIVE<=20    * MODERATE GROWTH CITROBACTER SPECIES  Culture,  respiratory (NON-Expectorated)     Status: None   Collection Time: 12/23/14 12:00 PM  Result Value Ref Range Status   Specimen Description SPUTUM  Final   Special Requests NONE  Final   Gram Stain   Final    MODERATE WBC SEEN FEW GRAM NEGATIVE RODS EXCELLENT SPECIMEN - 90-100% WBCS    Culture   Final    HEAVY GROWTH PSEUDOMONAS AERUGINOSA This organism isolate is resistant to one or more antiotic agents in three  or more antimicrobial categories.  Suggest Infectious Disease consult.   CRITICAL RESULT CALLED TO, READ BACK BY AND VERIFIED WITH: PAM CRAWFORD AT 1047 12/25/14 DV    Report Status 12/25/2014 FINAL  Final   Organism ID, Bacteria PSEUDOMONAS AERUGINOSA  Final      Susceptibility   Pseudomonas aeruginosa - MIC*    CEFTAZIDIME 16 INTERMEDIATE Intermediate     CIPROFLOXACIN >=4 RESISTANT Resistant     GENTAMICIN >=16 RESISTANT Resistant     IMIPENEM >=16 RESISTANT Resistant     PIP/TAZO Value in next row Sensitive      SENSITIVE32    * HEAVY GROWTH PSEUDOMONAS AERUGINOSA    Medical History: Past Medical History  Diagnosis Date  . Tracheostomy in place   . Diabetes   . CKD (chronic kidney disease)   . OSA (obstructive sleep apnea)   . HTN (hypertension)   . MDD (major depressive disorder)     Medications:  Scheduled:  . acetylcysteine  2 mL Nebulization TID  . apixaban  5 mg Oral BID  . atorvastatin  80 mg Oral q1800  . bisacodyl  10 mg Rectal QODAY  . escitalopram  5 mg Oral Daily  . famotidine  40 mg Oral Daily  . feeding supplement (GLUCERNA SHAKE)  237 mL Oral TID WC  . furosemide  40 mg Oral Daily  . insulin aspart  0-5 Units Subcutaneous QHS  . insulin aspart  0-9 Units Subcutaneous TID WC  . ipratropium-albuterol  3 mL Nebulization TID  . isosorbide dinitrate  30 mg Oral Daily  . megestrol  400 mg Oral Daily  . meropenem (MERREM) IV  1 g Intravenous 3 times per day  . multivitamin with minerals  1 tablet Oral Daily  . QUEtiapine  50 mg Oral QHS  .  senna-docusate  1 tablet Oral BID  . sodium chloride  10-40 mL Intracatheter Q12H   Infusions:  . sodium chloride 10 mL/hr at 12/31/14 0814  . fentaNYL infusion INTRAVENOUS 25 mcg/hr (12/31/14 0814)   PRN: acetaminophen **OR** acetaminophen, alum & mag hydroxide-simeth, fentaNYL, hydrALAZINE, Influenza vac split quadrivalent PF, ipratropium-albuterol, ondansetron **OR** ondansetron (ZOFRAN) IV, pneumococcal 23 valent vaccine, sodium chloride  Assessment: 69 y/o F on day 13 of meropenem for ESBL E coli as well as Pseudomonas/Citrobacter PNA.  Patient will need to continue therapy through 09/23 per ID.   Plan:  Will continue Merrem 1 g IV q8 hours.   Luisa Hart D 12/31/2014,12:20 PM

## 2014-12-31 NOTE — Significant Event (Deleted)

## 2014-12-31 NOTE — Progress Notes (Signed)
MD notified of NPO status in relation to pt Rachel Brady dose. Will hold tonight Brady dose and Md will evaluate med list.

## 2014-12-31 NOTE — Progress Notes (Signed)
[facility] Progess Note    ASSESSMENT/PLAN   69 yo obese white female with acute on chronic resp failure s/p trach with acute resp failure and acute cardiac arrest, now with left lower lobe pneumonia.   IMPRESSION:  Acute respiratory failure, vent dependence with mucous plug, left lower lobe atelectasis, left lower lobe pneumonia.  -The patient had to be put back on the ventilator this morning due to acute respiratory failure, she will therefore require nocturnal ventilation for the immediate future. -Continue Merrem to complete a 14 day course, which will be completed on 01/01/2015.  Altered mental status, suspect secondary to metabolic encephalopathy. -Mental status is improved this morning.  Chronic trach dependence, with chronic respiratory failure. -Continue to wean from the ventilator as tolerated. -Chronic hypercapnic respiratory failure, likely due to COPD, emphysema, possibly obesity hypoventilation.  Acute on chronic diastolic congestive heart failure. -Continue furosemide , the patient's creatinine continues to trend downwards.  Pulmonary hypertension, likely group 2 and/or group. 3. Likely pulm edema/CHF-will obtain echo  ESBL E coli UTI  Psedomonas/citrobacter tracheobronchitis vs PNA -Continue broad-spectrum antibiotics, Merrem. Patient will complete a 14 day course of antibiotics tomorrow.  Coronary artery disease, S/P CABG x 4 -Continue current regimen.  S/P aortic valve replacement with bioprosthetic valve -Currently on apixaban .   Nutrition. -Continue to advance diet as tolerated. -Continue Pepcid for GI prophylaxis.  Case discussed with critical care RN  and nutrition. We'll continue the weaning the vent as tolerated. The patient can likely be discharged to soon to a chronic ventilator facility where she will be continued on nocturnal ventilation, which can be weaned off progressively over  time. ---------------------------------------  CXR 12/29/14 image reviewed; continued LLL atelectasis/pneumonia.  ---------------------------------------- Case was discussed with the critical care RN, nutrition services, critical care pharmacist.  Name: Rachel Brady MRN: [id number] DOB: 22-Jun-1945    ADMISSION DATE:  12/13/2014    CHIEF COMPLAINT: Dyspnea   STUDIES:  --   SUBJECTIVE:   Patient is sleepy but arousable on the ventilator.  Review of Systems:  Pt currently on the ventilator, can not provide history or review of systems.    VITAL SIGNS: Temp:  [97.5 F (36.4 C)-99.2 F (37.3 C)] 98.4 F (36.9 C) (09/22 1200) Pulse Rate:  [53-180] 75 (09/22 1200) Resp:  [15-22] 17 (09/22 1200) BP: (100-170)/(60-135) 116/74 mmHg (09/22 1200) SpO2:  [90 %-100 %] 96 % (09/22 1200) FiO2 (%):  [28 %-40 %] 28 % (09/22 1200) Weight:  [102.7 kg (226 lb 6.6 oz)] 102.7 kg (226 lb 6.6 oz) (09/22 0429) HEMODYNAMICS:   VENTILATOR SETTINGS: Vent Mode:  [-] PRVC FiO2 (%):  [28 %-40 %] 28 % Set Rate:  [16 bmp] 16 bmp Vt Set:  [500 mL] 500 mL PEEP:  [5 cmH20] 5 cmH20 INTAKE / OUTPUT:  Intake/Output Summary (Last 24 hours) at 12/31/14 1214 Last data filed at 12/31/14 1000  Gross per 24 hour  Intake 936.76 ml  Output    950 ml  Net -13.24 ml    PHYSICAL EXAMINATION: Physical Examination:   VS: BP 116/74 mmHg  Pulse 75  Temp(Src) 98.4 F (36.9 C) (Oral)  Resp 17  Ht  (1.702 m)  Wt 102.7 kg (226 lb 6.6 oz)  BMI 35.45 kg/m2  SpO2 96%  General Appearance: No distress  Neuro:without focal findings, lethargic but arousable. HEENT: PERRLA, EOM intact. Pulmonary: normal breath sounds   CardiovascularNormal S1,S2.  No m/r/g.   Abdomen: Benign, Soft, non-tender. Renal:  No costovertebral tenderness  GU:  Not performed at this time. Endocrine: No evident thyromegaly. Skin:   warm, no rashes, no ecchymosis  Extremities: normal, no cyanosis,  clubbing.   LABS:   LABORATORY PANEL:   CBC  Recent Labs Lab 12/31/14 0352  WBC 4.0  HGB 9.4*  HCT 30.4*  PLT 125*    Chemistries   Recent Labs Lab 12/31/14 0352  NA 145  K 3.8  CL 103  CO2 36*  GLUCOSE 154*  BUN 25*  CREATININE 1.06*  CALCIUM 11.9*     Recent Labs Lab 12/30/14 0717 12/30/14 1142 12/30/14 1606 12/30/14 2057 12/31/14 0719 12/31/14 1109  GLUCAP 87 121* 136* 149* 122* 112*    Recent Labs Lab 12/27/14 2248 12/29/14 0419  PHART 7.35 7.54*  PCO2ART 57* 45  PO2ART 63* 80*   No results for input(s): AST, ALT, ALKPHOS, BILITOT, ALBUMIN in the last 168 hours.  Cardiac Enzymes  Recent Labs Lab 12/28/14 1208  TROPONINI 0.08*    RADIOLOGY:  No results found.     Wells Guiles, MD.  Pager [phone] [facility] Office Number: [phone]  Santiago Glad, M.D.  Stephanie Acre, M.D.  Billy Fischer, M.D  Critical Care Attestation.  I have personally obtained a history, examined the patient, evaluated laboratory and imaging results, formulated the assessment and plan and placed orders. The Patient requires high complexity decision making for assessment and support, frequent evaluation and titration of therapies, application of advanced monitoring technologies and extensive interpretation of multiple databases. The patient has critical illness that could lead imminently to failure of 1 or more organ systems and requires the highest level of physician preparedness to intervene.  Critical Care Time devoted to patient care services described in this note is 35 minutes and is exclusive of time spent in procedures.

## 2014-12-31 NOTE — Progress Notes (Signed)
CRITICAL VALUE ALERT  Critical value received: Blood suagr /dl  Date of ZOXWRUEAVWUJ:81/19/14  Time of notification:  2128  Critical value read back:Yes.    Nurse who received alert:  Imma RN  MD notified (1st page):  Dr Clint Guy  Time of first page:  2128  MD notified (2nd page):  Time of second page:  Responding MD:  Dr Clint Guy  Time MD responded:  2138 See new order

## 2014-12-31 NOTE — Progress Notes (Signed)
Pt breathing 30-40 times a minute, Sat drooping to 87-90 on 100% trach collar. Trach suctioned non productive. Pt using her accessory muscles when trying to breath. RT notified and pt placed back on the vent 28%/16/500. Pt tolerating it well. Report in am RN

## 2014-12-31 NOTE — Progress Notes (Signed)
Given patient's inability take Eliquis for atrial fibrillation will initiate Lovenox until the time that she is able to take oral medications

## 2015-01-01 ENCOUNTER — Inpatient Hospital Stay: Payer: Medicare Other

## 2015-01-01 DIAGNOSIS — E46 Unspecified protein-calorie malnutrition: Secondary | ICD-10-CM

## 2015-01-01 DIAGNOSIS — D696 Thrombocytopenia, unspecified: Secondary | ICD-10-CM

## 2015-01-01 DIAGNOSIS — J189 Pneumonia, unspecified organism: Secondary | ICD-10-CM | POA: Insufficient documentation

## 2015-01-01 LAB — CBC
HCT: 27.3 % — ABNORMAL LOW (ref 35.0–47.0)
Hemoglobin: 8.7 g/dL — ABNORMAL LOW (ref 12.0–16.0)
MCH: 26.9 pg (ref 26.0–34.0)
MCHC: 31.9 g/dL — AB (ref 32.0–36.0)
MCV: 84.5 fL (ref 80.0–100.0)
PLATELETS: 112 10*3/uL — AB (ref 150–440)
RBC: 3.23 MIL/uL — ABNORMAL LOW (ref 3.80–5.20)
RDW: 20.5 % — AB (ref 11.5–14.5)
WBC: 7.3 10*3/uL (ref 3.6–11.0)

## 2015-01-01 LAB — GLUCOSE, CAPILLARY
GLUCOSE-CAPILLARY: 59 mg/dL — AB (ref 65–99)
GLUCOSE-CAPILLARY: 93 mg/dL (ref 65–99)
Glucose-Capillary: 83 mg/dL (ref 65–99)
Glucose-Capillary: 74 mg/dL (ref 65–99)
Glucose-Capillary: 95 mg/dL (ref 65–99)
Glucose-Capillary: 74 mg/dL (ref 65–99)

## 2015-01-01 MED ORDER — FENTANYL 75 MCG/HR TD PT72: 75.0000 ug | MEDICATED_PATCH | TRANSDERMAL | Status: DC

## 2015-01-01 MED ORDER — FENTANYL CITRATE (PF) 100 MCG/2ML IJ SOLN
INTRAMUSCULAR | Status: AC
  Administered 2015-01-01: 25 ug
  Filled 2015-01-01: qty 2

## 2015-01-01 MED ORDER — DEXTROSE 50 % IV SOLN
INTRAVENOUS | Status: AC
  Administered 2015-01-01: 50 mL
  Filled 2015-01-01: qty 50

## 2015-01-01 MED ORDER — VITAL HIGH PROTEIN PO LIQD
1000.0000 mL | ORAL | Status: DC
  Administered 2015-01-01: 1000 mL
  Administered 2015-01-01 – 2015-01-02 (×6)
  Administered 2015-01-02: 1000 mL
  Administered 2015-01-02 (×2)
  Administered 2015-01-02: 1000 mL
  Administered 2015-01-02
  Administered 2015-01-03: 1000 mL
  Administered 2015-01-04 (×2)
  Administered 2015-01-04 (×2): 1000 mL
  Administered 2015-01-04 (×5)

## 2015-01-01 MED ORDER — DEXTROSE-NACL 5-0.9 % IV SOLN
INTRAVENOUS | Status: DC
  Administered 2015-01-01 – 2015-01-02 (×3): via INTRAVENOUS

## 2015-01-01 MED ORDER — FENTANYL 50 MCG/HR TD PT72
50.0000 ug | MEDICATED_PATCH | TRANSDERMAL | Status: DC
  Administered 2015-01-01 – 2015-01-04 (×2): 50 ug via TRANSDERMAL
  Filled 2015-01-01 (×2): qty 1

## 2015-01-01 MED ORDER — FREE WATER
200.0000 mL | Freq: Three times a day (TID) | Status: DC
  Administered 2015-01-01 – 2015-01-02 (×3): 200 mL

## 2015-01-01 MED ORDER — LORAZEPAM 2 MG/ML IJ SOLN
1.0000 mg | INTRAMUSCULAR | Status: DC | PRN
  Administered 2015-01-01: 2 mg via INTRAVENOUS
  Administered 2015-01-02: 1 mg via INTRAVENOUS
  Filled 2015-01-01 (×2): qty 1

## 2015-01-01 MED ORDER — FENTANYL 50 MCG/HR TD PT72: 50.0000 ug | MEDICATED_PATCH | TRANSDERMAL | Status: DC

## 2015-01-01 NOTE — Consult Note (Signed)
[facility] Cardiology Consultation Note  Patient ID: Rachel Brady, MRN: [id number], DOB/AGE: 1945/07/14 69 y.o. Admit date: 12/13/2014   Date of Consult: 01/01/2015 Primary Physician: Dorothey Baseman, MD Primary Cardiologist: Gwen Pounds  Chief Complaint:  Chief Complaint  Patient presents with  . Respiratory Arrest   Reason for Consult: symptomatic bradycardia  HPI: 69 y.o. female with known aortic valve stenosis severe in nature status post repair and replacement with chronic atrial fibrillation nonvalvular in nature and severe sleep apnea diabetes with complications recently admitted for urinary tract infection and sepsis. The patient has had waxing and waning of the condition for which the blood pressure has been significantly labile. The patient has had atrial fibrillation with rapid ventricular rate and was placed on carvedilol for treatment of hypertension and heart rate control. With this the patient also had an episode of significant symptomatic bradycardia for which a recall was started. The patient did have compressions and did recover from this relatively well. The patient does have an elevated troponin of 0.08 most consistent with this treatment and not consistent with an acute myocardial infarction. Therefore the patient had had decided continuation of beta blocker at that time. Since this event the patient has had abstinence of carvedilol and heart rate has been reasonably well controlled and not evidence of new thickening sick sinus syndrome. The patient also may have had some concerns of mucous plugging and or pulmonary edema which currently is relatively stable and improved. The patient cannot take oral medications at this time and will continue anticoagulation with intravenous and/or subcutaneous medical  Past Medical History  Diagnosis Date  . Tracheostomy in place   . Diabetes   . CKD (chronic kidney disease)   . OSA (obstructive sleep apnea)   . HTN (hypertension)    . MDD (major depressive disorder)       Surgical History: History reviewed. No pertinent past surgical history.   Home Meds: Prior to Admission medications   Medication Sig Start Date End Date Taking? Authorizing Provider  acetaminophen (TYLENOL) 325 MG tablet Take 650 mg by mouth every 6 (six) hours as needed for mild pain, moderate pain or fever.   Yes Historical Provider, MD  ALPRAZolam Prudy Feeler) 0.5 MG tablet Take 0.5 mg by mouth every 8 (eight) hours as needed for anxiety.   Yes Historical Provider, MD  apixaban (ELIQUIS) 5 MG TABS tablet Take 5 mg by mouth 2 (two) times daily. 11/09/14  Yes Historical Provider, MD  atorvastatin (LIPITOR) 80 MG tablet Take 80 mg by mouth at bedtime.   Yes Historical Provider, MD  bisacodyl (DULCOLAX) 10 MG suppository Place 10 mg rectally every other day.   Yes Historical Provider, MD  carvedilol (COREG) 6.25 MG tablet Take 6.25 mg by mouth 2 (two) times daily. Hold if SBP <105 or HR <60.   Yes Historical Provider, MD  escitalopram (LEXAPRO) 5 MG tablet Take 5 mg by mouth daily.   Yes Historical Provider, MD  famotidine (PEPCID) 40 MG tablet Take 40 mg by mouth daily.   Yes Historical Provider, MD  furosemide (LASIX) 40 MG tablet Take 40 mg by mouth daily.   Yes Historical Provider, MD  insulin aspart (NOVOLOG FLEXPEN) 100 UNIT/ML FlexPen Inject into the skin 4 (four) times daily -  before meals and at bedtime. Sliding scale: if blood sugar is 151-200=4 units, 201-250=8 units, 251-300=10 units, 301-350=12 units, greater than 351=Call MD   Yes Historical Provider, MD  Insulin Detemir (LEVEMIR FLEXTOUCH) 100 UNIT/ML Pen Inject  10 Units into the skin every morning.   Yes Historical Provider, MD  ipratropium-albuterol (DUONEB) 0.5-2.5 (3) MG/3ML SOLN Take 3 mLs by nebulization See admin instructions. Use 1 vial via nebulizer every 6 hours (scheduled). Use 1 vial via nebulizer every 2 hours as needed for shortness of breath and/or wheezing.   Yes Historical  Provider, MD  isosorbide dinitrate (ISORDIL) 30 MG tablet Take 30 mg by mouth daily.   Yes Historical Provider, MD  megestrol (MEGACE) 400 MG/10ML suspension Take 400 mg by mouth daily.   Yes Historical Provider, MD  Multiple Vitamin (MULTIVITAMIN) tablet Take 1 tablet by mouth daily.   Yes Historical Provider, MD  nystatin cream (MYCOSTATIN) Apply 1 application topically 2 (two) times daily.   Yes Historical Provider, MD  ondansetron (ZOFRAN) 4 MG tablet Take 4 mg by mouth every 6 (six) hours as needed for nausea or vomiting.   Yes Historical Provider, MD  oxyCODONE (OXY IR/ROXICODONE) 5 MG immediate release tablet Take 5 mg by mouth every 6 (six) hours as needed for severe pain.   Yes Historical Provider, MD  POLYETHYLENE GLYCOL 3350 PO Take 17 g by mouth daily. Mix with 4-8 ounces of water.   Yes Historical Provider, MD  potassium chloride SA (K-DUR,KLOR-CON) 20 MEQ tablet Take 40 mEq by mouth daily.   Yes Historical Provider, MD  QUEtiapine (SEROQUEL) 50 MG tablet Take 50 mg by mouth at bedtime.   Yes Historical Provider, MD    Inpatient Medications:  . acetylcysteine  2 mL Nebulization TID  . apixaban  5 mg Oral BID  . atorvastatin  80 mg Oral q1800  . enoxaparin (LOVENOX) injection  105 mg Subcutaneous Q12H  . escitalopram  5 mg Oral Daily  . famotidine  40 mg Oral Daily  . feeding supplement (GLUCERNA SHAKE)  237 mL Oral TID WC  . furosemide  40 mg Oral Daily  . insulin aspart  0-5 Units Subcutaneous QHS  . insulin aspart  0-9 Units Subcutaneous TID WC  . ipratropium-albuterol  3 mL Nebulization TID  . isosorbide dinitrate  30 mg Oral Daily  . megestrol  400 mg Oral Daily  . meropenem (MERREM) IV  1 g Intravenous 3 times per day  . multivitamin with minerals  1 tablet Oral Daily  . QUEtiapine  50 mg Oral QHS  . senna-docusate  1 tablet Oral BID  . sodium chloride  10-40 mL Intracatheter Q12H   . sodium chloride Stopped (01/01/15 0131)  . dextrose 5 % and 0.9% NaCl 75 mL/hr at  01/01/15 0500  . fentaNYL infusion INTRAVENOUS 75 mcg/hr (01/01/15 6644)    Allergies:  Allergies  Allergen Reactions  . Codeine     Has tolerated oxycodone in the past.    Social History   Social History  . Marital Status: Single    Spouse Name: N/A  . Number of Children: N/A  . Years of Education: N/A   Occupational History  . Not on file.   Social History Main Topics  . Smoking status: Never Smoker   . Smokeless tobacco: Not on file  . Alcohol Use: Not on file  . Drug Use: Not on file  . Sexual Activity: Not on file   Other Topics Concern  . Not on file   Social History Narrative     History reviewed. No pertinent family history.   Review of Systems  Unable to assess  Labs: No results for input(s): CKTOTAL, CKMB, TROPONINI in the last 72 hours.  Lab Results  Component Value Date   WBC 7.3 01/01/2015   HGB 8.7* 01/01/2015   HCT 27.3* 01/01/2015   MCV 84.5 01/01/2015   PLT 112* 01/01/2015    Recent Labs Lab 12/31/14 0352  NA 145  K 3.8  CL 103  CO2 36*  BUN 25*  CREATININE 1.06*  CALCIUM 11.9*  GLUCOSE 154*   No results found for: CHOL, HDL, LDLCALC, TRIG No results found for: DDIMER  Radiology/Studies:  Dg Chest 1 View  12/31/2014   CLINICAL DATA:  Patient with sepsis.  Followup pneumonia.  EXAM: CHEST 1 VIEW  COMPARISON:  12/29/2014 chest radiograph  FINDINGS: Patient is rotated to the left. Tracheostomy tube stable in position. Status post median sternotomy. Stable markedly enlarged cardiac and mediastinal contours. Re- demonstrated moderate left-greater-than-right pleural effusions with underlying pulmonary consolidation. No definite pneumothorax. Left upper extremity PICC line tip projects over the central left brachiocephalic vein. Multiple old rib fractures.  IMPRESSION: Stable marked cardiomegaly with persistent moderate bilateral pleural effusions and underlying pulmonary consolidation suggestive of atelectasis and edema. Infection not  excluded.   Electronically Signed   By: Annia Belt M.D.   On: 12/31/2014 13:18   Dg Chest 1 View  12/29/2014   CLINICAL DATA:  Dyspnea.  EXAM: CHEST  1 VIEW  COMPARISON:  12/28/2014 and 12/25/2014  FINDINGS: Left-sided PICC line and tracheostomy tube unchanged. Median sternotomy wires and prosthetic heart valve unchanged.  Lungs are somewhat hypoinflated and demonstrate continued moderate opacification over the left mid to lower lung/retrocardiac region without significant change likely moderate size effusion with associated compressive atelectasis. Evidence of a small right pleural effusion without significant change. Cannot exclude infection in the lung bases. Prominence of the perihilar markings slightly improved likely mild interstitial edema. Moderate cardiomegaly which is stable. Remainder of the exam is unchanged.  IMPRESSION: Moderate stable cardiomegaly with slightly improved interstitial edema. Stable small right pleural effusion. Evidence of stable moderate left pleural effusion and associated atelectasis. Cannot exclude infection in the lung bases.   Electronically Signed   By: Elberta Fortis M.D.   On: 12/29/2014 08:18   Dg Chest 1 View  12/28/2014   CLINICAL DATA:  Pneumonia.  EXAM: CHEST  1 VIEW  COMPARISON:  Nineteen 2016 .  FINDINGS: Tracheostomy tube in stable position. Left PICC line stable position. Prior CABG and aortic valve replacement. Persistent severe cardiomegaly. Interim partial clearing of pulmonary edema and/or infiltrates. Persistent bilateral small pleural effusions. No pneumothorax. Prior thoracic spine fusion .  IMPRESSION: 1. Lines and tubes in stable position. 2. Prior CABG and aortic valve replacement . Persistent severe cardiomegaly . 3. Interim partial clearing of bilateral pulmonary edema and/or infiltrate. Persistent small bilateral pleural effusions.   Electronically Signed   By: Maisie Fus  Register   On: 12/28/2014 07:36   Dg Chest 1 View  12/13/2014   CLINICAL DATA:   Respiratory arrest twice this morning.  EXAM: CHEST  1 VIEW  COMPARISON:  08/02/2014.  FINDINGS: Stable enlarged cardiac silhouette and post CABG changes. Tracheostomy tube in satisfactory position. Dense airspace opacity in the lower half of the left lung. Patchy airspace opacity in the right mid and lower lung zones. Bilateral pleural effusions. Stable prosthetic heart valve and thoracic spine fixation hardware. Thoracic spine degenerative changes.  IMPRESSION: 1. Dense left basilar atelectasis or pneumonia. 2. Right mid and lower lung zone pneumonia or alveolar edema. 3. Cardiomegaly and bilateral pleural effusions.   Electronically Signed   By: Zada Finders.D.  On: 12/13/2014 10:17   Ct Head Wo Contrast  12/13/2014   CLINICAL DATA:  Patient presents to the ED via EMS from [facility]. Patient stopped breathing twice with EMS but after they have approx. 5 breaths with bag mask ventilation patient's spontaneous breathing returned. This occurred twice enroute to hospital. On arrival patient was hypotensive and responding to pain only. Patient breathing on her own at this time. Eval AMS  EXAM: CT HEAD WITHOUT CONTRAST  TECHNIQUE: Contiguous axial images were obtained from the base of the skull through the vertex without intravenous contrast.  COMPARISON:  04/03/2011  FINDINGS: There is moderate central and cortical atrophy. Periventricular white matter changes are consistent with small vessel disease. There is no intra or extra-axial fluid collection or mass lesion. The basilar cisterns and ventricles have a normal appearance. There is no CT evidence for acute infarction or hemorrhage. Bone windows show no acute findings. There is atherosclerotic calcification of the internal carotid arteries.  IMPRESSION: 1. Atrophy and small vessel disease. 2.  No evidence for acute intracranial abnormality.   Electronically Signed   By: Norva Pavlov M.D.   On: 12/13/2014 10:56   Dg Chest Port 1 View  12/27/2014    CLINICAL DATA:  Respiratory failure, diabetes mellitus, hypertension  EXAM: PORTABLE CHEST - 1 VIEW  COMPARISON:  Portable exam 2252 hr compared to 12/25/2014  FINDINGS: Rotated to the LEFT.  Tracheostomy tube projects over tracheal air column.  LEFT arm PICC line has been partially withdrawn, tip projecting over the confluence of the brachiocephalic vein and SVC.  External pacing leads present.  Enlargement of cardiac silhouette post CABG and AVR.  Pulmonary vascular congestion.  Slightly improved pulmonary edema.  Bibasilar effusions.  No pneumothorax.  IMPRESSION: Enlargement of cardiac silhouette post cardiac surgery with pulmonary vascular congestion and slightly improved pulmonary edema.  Bibasilar effusions persist.   Electronically Signed   By: Ulyses Southward M.D.   On: 12/27/2014 23:56   Dg Chest Port 1 View  12/25/2014   CLINICAL DATA:  Respiratory failure.  EXAM: PORTABLE CHEST - 1 VIEW  COMPARISON:  12/23/2014 .  FINDINGS: Tracheostomy tube and left PICC line in stable position. Prior CABG and cardiac valve replacement. Persistent severe cardiomegaly and prominent bilateral pleural effusions. Underlying bibasilar pulmonary infiltrates/edema cannot be excluded. Low lung volumes. No pneumothorax.  IMPRESSION: 1. Lines and tubes in stable position. 2. Prior CABG and cardiac valve replacement. Persistent severe cardiomegaly. Persistent bilateral prominent pleural effusions. Underlying bibasilar infiltrates/pulmonary edema cannot be excluded. Low lung volumes.   Electronically Signed   By: Maisie Fus  Register   On: 12/25/2014 07:21   Dg Chest Port 1 View  12/23/2014   CLINICAL DATA:  Respiratory failure.  EXAM: PORTABLE CHEST - 1 VIEW  COMPARISON:  Yesterday  FINDINGS: Tracheostomy tube remains well seated.  Left upper extremity PICC, tip at the upper cavoatrial junction.  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 No gross change from prior. Unchanged large left and small to moderate right pleural  effusion with underlying lung opacity. No pulmonary edema seen in the upper lungs. No air leak. Remote bilateral rib fractures.  IMPRESSION: 1. Stable pleural effusions, large on the left, obscuring the lower lungs. 2. Chronic pulmonary venous congestion.   Electronically Signed   By: Marnee Spring M.D.   On: 12/23/2014 08:06   Dg Chest Port 1 View  12/22/2014   CLINICAL DATA:  Tracheostomy.  Respiratory failure .  EXAM: PORTABLE CHEST - 1 VIEW  COMPARISON:  12/20/2014.  FINDINGS: Interim removal of feeding tube. Tracheostomy tube and left PICC line stable position. Prior CABG. Cardiac valve replacement. Stable cardiomegaly. Mild pulmonary vascular congestion. Persistent bibasilar pulmonary infiltrates/ edema with bilateral pleural effusions. No significant interim improvement. No pneumothorax. Old left rib fractures.  IMPRESSION: 1. Interim removal of feeding tube. Tracheostomy tube, left PICC line and stable position. 2. Prior CABG and cardiac valve replacement. Persistent severe cardiomegaly. Mild pulmonary venous congestion. 3. Dense unchanged bibasilar pulmonary infiltrates and/or edema with bilateral pleural effusions.   Electronically Signed   By: Maisie Fus  Register   On: 12/22/2014 08:06   Dg Chest Port 1 View  12/20/2014   CLINICAL DATA:  69 year old female with a history of respiratory failure  EXAM: PORTABLE CHEST - 1 VIEW  COMPARISON:  12/19/2014, 12/15/2014, 12/14/2014  FINDINGS: Cardiomediastinal silhouette unchanged.  Surgical changes of median sternotomy and CABG.  Unchanged tracheostomy tube.  Persisting opacity at the left base with obscuration left hemidiaphragm, left costophrenic angle, and left heart border.  Similar opacity at the right base partially obscuring the right hemidiaphragm and right costophrenic angle.  Unchanged left-sided PICC.  IMPRESSION: Similar appearance the chest x-ray with persisting dense left basilar opacity, potentially a combination of lung volume loss/  atelectasis, consolidation/infection, and/or pleural effusion.  Similar appearance of the right basilar opacity, likely combination of pleural effusion atelectasis.  Similar appearance of cardiomegaly. Pericardial effusion not excluded. If there is concern for cardiac compromise, correlation with ECHO may be useful.  Unchanged left upper extremity PICC and tracheostomy tube.  Signed,  Yvone Neu. Loreta Ave, DO  [facility]  [facility]   Electronically Signed   By: Gilmer Mor D.O.   On: 12/20/2014 09:24   Dg Chest Port 1 View  12/19/2014   CLINICAL DATA:  PICC line placement  EXAM: PORTABLE CHEST - 1 VIEW  COMPARISON:  12/15/2014  FINDINGS: Postoperative changes in the mediastinum. Shallow inspiration. Prominent large moped cardiac silhouette which could be due to cardiac enlargement and/ or pericardial effusion. Small bilateral pleural effusions with basilar atelectasis or infiltration. Effusions are increasing since previous study. Tracheostomy. Enteric tube with tip in the left upper quadrant, likely in the stomach. Left PICC catheter with tip over the mid SVC region. No pneumothorax.  IMPRESSION: Appliances appear in satisfactory location. Enlargement of the cardiac silhouette. Increasing bilateral pleural effusions and basilar atelectasis.   Electronically Signed   By: Burman Nieves M.D.   On: 12/19/2014 21:26   Dg Chest Port 1 View  12/15/2014   CLINICAL DATA:  Respiratory distress  EXAM: PORTABLE CHEST - 1 VIEW  COMPARISON:  12/14/2014  FINDINGS: There is a tracheostomy tube in satisfactory position. There is bilateral interstitial and alveolar airspace opacities. There is a trace right pleural effusion. There is a moderate left pleural effusion with associated airspace disease likely reflecting atelectasis. There is prominence of the central pulmonary vasculature. There is no pneumothorax. There is stable cardiomegaly. There is evidence of prior CABG. There  is no acute osseous abnormality.  IMPRESSION: 1. Overall findings most consistent with CHF. 2. Moderate left pleural effusion with associated airspace disease likely reflecting atelectasis.   Electronically Signed   By: Elige Ko   On: 12/15/2014 09:20   Portable Chest 1 View  12/14/2014   CLINICAL DATA:  69 year old female with respiratory failure, sepsis. Initial encounter.  EXAM: PORTABLE CHEST - 1 VIEW  COMPARISON:  12/13/2014 and earlier.  FINDINGS: Portable AP semi upright view at  0555 hours. Stable tracheostomy tube. Stable severe cardiomegaly. Other mediastinal contours are stable, suggestion of some central pulmonary artery enlargement. Mildly decreased pulmonary vascularity. No pneumothorax. Continued dense retrocardiac opacity. Less veiling opacity at the right lung base. Chronic bilateral lateral rib fractures.  IMPRESSION: 1. Severe cardiomegaly with dense lower lobe collapse or consolidation greater on the left. 2. Mildly decreased pulmonary vascularity and veiling opacity at the right lung base suggesting some improvement in pulmonary edema and right pleural effusion.   Electronically Signed   By: Odessa Fleming M.D.   On: 12/14/2014 07:57   Dg Abd Portable 1v  12/17/2014   CLINICAL DATA:  NG tube placement.  EXAM: PORTABLE ABDOMEN - 1 VIEW  COMPARISON:  None.  FINDINGS: Feeding tube tip is in the fundus of the stomach. Chronic pleural effusions and basilar atelectasis. Moderate air in the nondistended colon.  IMPRESSION: Feeding tube tip is in the fundus of the stomach.   Electronically Signed   By: Francene Boyers M.D.   On: 12/17/2014 14:55    EKG: Atrial fibrillation with controlled ventricular rate and nonspecific ST and T-wave changes  Weights: Filed Weights   12/30/14 0416 12/31/14 0429 01/01/15 0445  Weight: 230 lb 13.2 oz (104.7 kg) 226 lb 6.6 oz (102.7 kg) 223 lb 12.3 oz (101.5 kg)     Physical Exam: Blood pressure 105/63, pulse 77, temperature 99.2 F (37.3 C), temperature  source Oral, resp. rate 16, height  (1.702 m), weight 223 lb 12.3 oz (101.5 kg), SpO2 94 %. Body mass index is 35.04 kg/(m^2). General: Well developed, well nourished, in no acute distress. Head eyes ears nose throat: Normocephalic, atraumatic, sclera non-icteric, no xanthomas, nares are without discharge. No apparent thyromegaly and/or mass  Lungs: Normal respiratory effort.  no wheezes, no rales, no rhonchi.  Heart: RRR with normal S1 S2. no murmur gallop, no rub, PMI is normal size and placement, carotid upstroke normal without bruit, jugular venous pressure is normal Abdomen: Soft, non-tender, non-distended with normoactive bowel sounds. No hepatomegaly. No rebound/guarding. No obvious abdominal masses. Abdominal aorta is normal size without bruit Extremities: No edema. no cyanosis, no clubbing, no ulcers  Peripheral : 2+ bilateral upper extremity pulses, 2+ bilateral femoral pulses, 2+ bilateral dorsal pedal pulse Neuro: Alert and oriented. No facial asymmetry. No focal deficit. Moves all extremities spontaneously. Musculoskeletal: Normal muscle tone without kyphosis Psych:  Responds to questions appropriately with a normal affect.    Assessment: 69 year old female with known aortic valve stenosis status post replacement severe sleep apnea with tracheostomy diabetes with complication relation with sick sinus syndrome and significant symptomatic bradycardia now improved after discontinuation of carvedilol with elevated troponin consistent with demand ischemia and ACLS protocol rather than acute coronary syndrome  Plan: 1. Abstain from beta blocker due to concerns of bradycardia 2. Continue pulmonary toilet to assess and treat the possibility of the uterus plugging and other concerns which could cause symptomatic bradycardia 3. No further intervention of minimal elevation of troponin most consistent with demand ischemia 4. Anticoagulation with subcutaneous heparin 5. Oral anticoagulation  as able 5. Further reported care of respiratory abnormalities and urinary tract infection status post sepsis 6. Further cardiac diagnostics necessary at this time  Signed, Lamar Blinks M.D. [facility] 01/01/2015, 8:38 AM

## 2015-01-01 NOTE — Progress Notes (Signed)
Nutrition Follow-up    INTERVENTION:   Coordination of Care: discussed short term/long term nutritional poc during ICU rounds; pt remains on vent today, back and forth between vent and trach collar, unable to maintain consistent oral intake. Per MD Nicholos Johns, plan to place dobhoff today for nutrition and meds. Pt remains full code, Palliative care following, further poc to be determined EN: recommend starting Vital High Protein at goal rate of 55 ml/hr providing 1320 kcals, 116 g of protein, 1109 mL of free water. Orders to start at 20 ml/hr. Free water flush at 200 mL q 8 hours. Additional kcals from D5 infusion at present, hooefully can discontinue once feedings started via dobhoff, montior glucose levels. Continue to assess   NUTRITION DIAGNOSIS:   Inadequate oral intake related to acute illness as evidenced by NPO status.  GOAL:   Patient will meet greater than or equal to 90% of their needs  MONITOR:    (Energy Intake, Pulmonary, Digestive System, Anthropometrics, Electrolyte/Renal Profile, Glucose Profile)  REASON FOR ASSESSMENT:   Consult Enteral/tube feeding initiation and management  ASSESSMENT:     Pt remains on vent, confused, poorly responsive this AM, on fentanyl, on D5 infusion for hypoglycemia due to inability to eat on vent  Diet Order:   Dysphagia II but unable to take po due to vent status  Skin:   (stage II buttock)  Last BM:  9/22   Electrolyte and Renal Profile:  Recent Labs Lab 12/29/14 0520 12/30/14 0545 12/31/14 0352  BUN 26* 24* 25*  CREATININE 1.20* 1.08* 1.06*  NA 144 144 145  K 3.8 3.8 3.8   Glucose Profile:  Recent Labs  01/01/15 0012 01/01/15 0406 01/01/15 0713  GLUCAP 59* 83 74   Meds: D5-NS at 75 ml/hr, fentanyl, lasix, ss novolog, MVI, megace  Height:   Ht Readings from Last 1 Encounters:  12/13/14  (1.702 m)    Weight:   Wt Readings from Last 1 Encounters:  01/01/15 223 lb 12.3 oz (101.5 kg)    BMI:  Body  mass index is 35.04 kg/(m^2).  Estimated Nutritional Needs:   Kcal:  1610-9604 kcals (11-14 kcals/kg)   Protein:  122-153 g (2.0-2.5 g/kg) using IBW 61 kg  Fluid:  1525-1830 mL (25-30 ml/kg)   EDUCATION NEEDS:   No education needs identified at this time  HIGH Care Level  Romelle Starcher MS, RD, LDN [phone] Pager

## 2015-01-01 NOTE — Progress Notes (Signed)
ANTIBIOTIC CONSULT NOTE - FOLLOW UP   Pharmacy Consult for Meropenm Indication: ESBL UTI/Pseudomonas PNA  Allergies  Allergen Reactions  . Codeine     Has tolerated oxycodone in the past.    Patient Measurements: Height:  (170.2 cm) Weight: 223 lb 12.3 oz (101.5 kg) IBW/kg (Calculated) : 61.6   Vital Signs: Temp: 99 F (37.2 C) (09/23 1100) Temp Source: Oral (09/23 1100) BP: 121/85 mmHg (09/23 1100) Pulse Rate: 92 (09/23 1100) Intake/Output from previous day: 09/22 0701 - 09/23 0700 In: 678.5 [I.V.:378.5; IV Piggyback:300] Out: 1542 [Urine:1540; Stool:2] Intake/Output from this shift: Total I/O In: 525 [I.V.:525] Out: 200 [Urine:200]  Labs:  Recent Labs  12/30/14 0545 12/31/14 0352 01/01/15 0444  WBC 5.4 4.0 7.3  HGB 9.9* 9.4* 8.7*  PLT 141* 125* 112*  CREATININE 1.08* 1.06*  --    Estimated Creatinine Clearance: 62.2 mL/min (by C-G formula based on Cr of 1.06). No results for input(s): VANCOTROUGH, VANCOPEAK, VANCORANDOM, GENTTROUGH, GENTPEAK, GENTRANDOM, TOBRATROUGH, TOBRAPEAK, TOBRARND, AMIKACINPEAK, AMIKACINTROU, AMIKACIN in the last 72 hours.   Microbiology: Recent Results (from the past 720 hour(s))  Blood Culture (routine x 2)     Status: None   Collection Time: 12/13/14  9:25 AM  Result Value Ref Range Status   Specimen Description BLOOD LEFT ARM  Final   Special Requests BOTTLES DRAWN AEROBIC AND ANAEROBIC  Final   Culture  Setup Time   Final    GRAM POSITIVE COCCI IN CLUSTERS ANAEROBIC BOTTLE ONLY CRITICAL RESULT CALLED TO, READ BACK BY AND VERIFIED WITH: LESLIE LEWIS 12/14/2014 0630 [facility] CONFIRMED BY [facility]    Culture   Final    COAGULASE NEGATIVE STAPHYLOCOCCUS ANAEROBIC BOTTLE ONLY Results consistent with contamination.    Report Status 12/18/2014 FINAL  Final  Blood Culture (routine x 2)     Status: None   Collection Time: 12/13/14  9:35 AM  Result Value Ref Range Status   Specimen Description BLOOD RIGHT FORARM  Final   Special  Requests BOTTLES DRAWN AEROBIC AND ANAEROBIC  Final   Culture NO GROWTH 5 DAYS  Final   Report Status 12/18/2014 FINAL  Final  Urine culture     Status: None   Collection Time: 12/13/14  9:35 AM  Result Value Ref Range Status   Specimen Description URINE, RANDOM  Final   Special Requests NONE  Final   Culture   Final    >=100,000 COLONIES/mL ESCHERICHIA COLI ESBL-EXTENDED SPECTRUM BETA LACTAMASE-THE ORGANISM IS RESISTANT TO PENICILLINS, CEPHALOSPORINS AND AZTREONAM ACCORDING TO CLSI M100-S15 VOL.25 N01 JAN 2005. Results Called to: Derald Macleod AT 1119 12/15/14 CTJ    Report Status 12/15/2014 FINAL  Final   Organism ID, Bacteria ESCHERICHIA COLI  Final      Susceptibility   Escherichia coli - MIC*    AMPICILLIN >=32 RESISTANT Resistant     CEFTAZIDIME 16 RESISTANT Resistant     CEFAZOLIN >=64 RESISTANT Resistant     CEFTRIAXONE 32 RESISTANT Resistant     GENTAMICIN <=1 SENSITIVE Sensitive     IMIPENEM <=0.25 SENSITIVE Sensitive     TRIMETH/SULFA >=320 RESISTANT Resistant     Extended ESBL POSITIVE Resistant     CIPROFLOXACIN Value in next row Sensitive      SENSITIVE<=0.25    NITROFURANTOIN Value in next row Sensitive      SENSITIVE<=16    PIP/TAZO Value in next row Resistant      RESISTANT>=128    ERTAPENEM Value in next row Sensitive  SENSITIVE<=0.5    * >=100,000 COLONIES/mL ESCHERICHIA COLI  MRSA PCR Screening     Status: Abnormal   Collection Time: 12/13/14 12:20 PM  Result Value Ref Range Status   MRSA by PCR POSITIVE (A) NEGATIVE Final    Comment:        The GeneXpert MRSA Assay (FDA approved for NASAL specimens only), is one component of a comprehensive MRSA colonization surveillance program. It is not intended to diagnose MRSA infection nor to guide or monitor treatment for MRSA infections. CRITICAL RESULT CALLED TO, READ BACK BY AND VERIFIED WITH: ADAM SCARBORO  ON 12/13/14 BY HP   Culture, expectorated sputum-assessment     Status: None    Collection Time: 12/15/14 10:49 AM  Result Value Ref Range Status   Specimen Description ENDOTRACHEAL  Final   Special Requests NONE  Final   Sputum evaluation THIS SPECIMEN IS ACCEPTABLE FOR SPUTUM CULTURE  Final   Report Status 12/16/2014 FINAL  Final  Culture, respiratory (NON-Expectorated)     Status: None   Collection Time: 12/15/14 10:49 AM  Result Value Ref Range Status   Specimen Description ENDOTRACHEAL  Final   Special Requests NONE Reflexed from [id number]  Final   Gram Stain   Final    FEW WBC SEEN FEW GRAM NEGATIVE RODS FEW GRAM POSITIVE COCCI IN PAIRS FAIR SPECIMEN - 70-80% WBCS    Culture   Final    MODERATE GROWTH CITROBACTER SPECIES HEAVY GROWTH PSEUDOMONAS AERUGINOSA MULTI-DRUG RESISTANT ORGANISM CRITICAL RESULT CALLED TO, READ BACK BY AND VERIFIED WITH: [facility] BORBA 12/18/14 1042 JGF    Report Status 12/19/2014 FINAL  Final   Organism ID, Bacteria PSEUDOMONAS AERUGINOSA  Final   Organism ID, Bacteria CITROBACTER SPECIES  Final      Susceptibility   Pseudomonas aeruginosa - MIC*    CEFTAZIDIME >=64 RESISTANT Resistant     CIPROFLOXACIN >=4 RESISTANT Resistant     GENTAMICIN >=16 RESISTANT Resistant     IMIPENEM 1 SENSITIVE Sensitive     PIP/TAZO Value in next row Resistant      RESISTANT>=128    * HEAVY GROWTH PSEUDOMONAS AERUGINOSA   Citrobacter species - MIC*    PIP/TAZO Value in next row Resistant      RESISTANT>=128    CEFTAZIDIME Value in next row Sensitive      SENSITIVE<=4    CEFEPIME Value in next row Sensitive      SENSITIVE<=1    CEFAZOLIN Value in next row Resistant      RESISTANT>=64    CEFTRIAXONE Value in next row Sensitive      SENSITIVE8    CIPROFLOXACIN Value in next row Sensitive      SENSITIVE<=0.25    GENTAMICIN Value in next row Sensitive      SENSITIVE<=1    IMIPENEM Value in next row Sensitive      SENSITIVE<=0.25    TRIMETH/SULFA Value in next row Sensitive      SENSITIVE<=20    * MODERATE GROWTH CITROBACTER SPECIES   Culture, respiratory (NON-Expectorated)     Status: None   Collection Time: 12/23/14 12:00 PM  Result Value Ref Range Status   Specimen Description SPUTUM  Final   Special Requests NONE  Final   Gram Stain   Final    MODERATE WBC SEEN FEW GRAM NEGATIVE RODS EXCELLENT SPECIMEN - 90-100% WBCS    Culture   Final    HEAVY GROWTH PSEUDOMONAS AERUGINOSA This organism isolate is resistant to one or more antiotic agents in three  or more antimicrobial categories.  Suggest Infectious Disease consult.   CRITICAL RESULT CALLED TO, READ BACK BY AND VERIFIED WITH: PAM CRAWFORD AT 1047 12/25/14 DV    Report Status 12/25/2014 FINAL  Final   Organism ID, Bacteria PSEUDOMONAS AERUGINOSA  Final      Susceptibility   Pseudomonas aeruginosa - MIC*    CEFTAZIDIME 16 INTERMEDIATE Intermediate     CIPROFLOXACIN >=4 RESISTANT Resistant     GENTAMICIN >=16 RESISTANT Resistant     IMIPENEM >=16 RESISTANT Resistant     PIP/TAZO Value in next row Sensitive      SENSITIVE32    * HEAVY GROWTH PSEUDOMONAS AERUGINOSA    Medical History: Past Medical History  Diagnosis Date  . Tracheostomy in place   . Diabetes   . CKD (chronic kidney disease)   . OSA (obstructive sleep apnea)   . HTN (hypertension)   . MDD (major depressive disorder)     Medications:  Scheduled:  . acetylcysteine  2 mL Nebulization TID  . apixaban  5 mg Oral BID  . atorvastatin  80 mg Oral q1800  . escitalopram  5 mg Oral Daily  . famotidine  40 mg Oral Daily  . feeding supplement (VITAL HIGH PROTEIN)  1,000 mL Per Tube Q24H  . free water  200 mL Per Tube 3 times per day  . furosemide  40 mg Oral Daily  . insulin aspart  0-5 Units Subcutaneous QHS  . insulin aspart  0-9 Units Subcutaneous TID WC  . ipratropium-albuterol  3 mL Nebulization TID  . isosorbide dinitrate  30 mg Oral Daily  . megestrol  400 mg Oral Daily  . meropenem (MERREM) IV  1 g Intravenous 3 times per day  . multivitamin with minerals  1 tablet Oral Daily  .  QUEtiapine  50 mg Oral QHS  . senna-docusate  1 tablet Oral BID  . sodium chloride  10-40 mL Intracatheter Q12H   Infusions:  . sodium chloride Stopped (01/01/15 0131)  . dextrose 5 % and 0.9% NaCl 75 mL/hr at 01/01/15 0900  . fentaNYL infusion INTRAVENOUS 50 mcg/hr (01/01/15 1122)   PRN: acetaminophen **OR** acetaminophen, alum & mag hydroxide-simeth, fentaNYL, hydrALAZINE, Influenza vac split quadrivalent PF, ipratropium-albuterol, ondansetron **OR** ondansetron (ZOFRAN) IV, pneumococcal 23 valent vaccine, sodium chloride  Assessment: 69 y/o F on day 14 of meropenem for ESBL E coli as well as Pseudomonas/Citrobacter PNA.  Patient will need to continue therapy through 09/23 per ID.   Plan:  Will continue Merrem 1 g IV q8 hours. Today is the final day of therapy.   Luisa Hart D 01/01/2015,12:01 PM

## 2015-01-01 NOTE — Care Management Note (Signed)
Case Management Note  Patient Details  Name: MALEYAH EVANS MRN: [id number] Date of Birth: 04/09/1946  Subjective/Objective:  Patient remains on the vent. There is concern that due to her intermittent respiratory distress and bradycardia that she is not stable for transfer to a vent SNF at this time. The plan is for her to go to [facility] next week (not LTACH since she has no remaining medicare LTACH days and [employer] declined to approve LTACH). Transfer will be based on the fact that she is stable to be transferred to a SNF as this is not a acute level of care.                  Action/Plan: SNF when medically stable  Expected Discharge Date:                  Expected Discharge Plan:  Skilled Nursing Facility  In-House Referral:  Clinical Social Work  Discharge planning Services  CM Consult  Post Acute Care Choice:  Long Term Acute Care (LTAC) Choice offered to:  Adult Children  DME Arranged:    DME Agency:     HH Arranged:    HH Agency:     Status of Service:  In process, will continue to follow  Medicare Important Message Given:  Yes-third notification given Date Medicare IM Given:    Medicare IM give by:    Date Additional Medicare IM Given:    Additional Medicare Important Message give by:     If discussed at Long Length of Stay Meetings, dates discussed:    Additional Comments:  Marily Memos, RN 01/01/2015, 8:59 AM

## 2015-01-01 NOTE — Progress Notes (Signed)
CRITICAL VALUE ALERT  Critical value received: CBG /DL  Date of notification:  01/01/15  Time of notification:  0050  Critical value read back:Yes.    Nurse who received alert:  IMMA RN  MD notified (1st page):  DR Clint Guy  Time of first page:  0050  MD notified (2nd page):  Time of second page:  Responding MD:  DR Clint Guy  Time MD responded:  0050  SEE NEW ORDERS

## 2015-01-01 NOTE — Consult Note (Signed)
Palliative Medicine Inpatient Consult Note   Name: Rachel Brady Date: 01/01/2015 MRN: [id number]  DOB: [birth year]  Referring Physician: Enid Baas, MD  Palliative Care consult requested for this 69 y.o. female for goals of medical therapy in patient with acute on chronic respiratory failure and a number of severe chronic conditions as detailed as below.  Pt was admitted here on 12/13/14 and it appears that she is going to need chronic nightly ventilation somewhat long term. She had resided at [facility] just a short time.  This was following an LTAC stay at [facility].    IMPRESSION: 1.  Acute on Chronic respiratory failure with hypoxemia ---acute respiratory failure due to sepsis effect and mucous plugs and left lower lung Pseudomonas pneumonia ---chronic lung disease from combination of following:    ---OSA     ---drug overdose in 04/2011 with respiratory failure ultimately requiring a trache    ---MRSA pneumonia bouts with resultant lung scarring    ---effects on lungs from CAD, CHF, effusions, and valvular disease and surgery    ---s/p tracheostomy in 2013 following a nearly terminal event of drug OD (thought to possibly represent a suicide attempt) 2.  UTI with resultant sepsis ---ESBL e coli bacteria = causative organism 3.  Respiratory arrest (she quit breathing in ambulance and was bagged ) 4.  Cardia arrest on 12/27/14 with full code blue (EPI X plus CPR resulted in recovery)  5.  H/O substance abuse but details are unclear 6.  Essential HTN 7.  Obesity 8.  H/O major depressive disorder 9.  CKD stage 3 10. DM2 11.Atrial Fibrillation 12. GERD 13. Metabolic Encephalopathy from chronic and acute illness 14. Tachycardia ---due to demand ischemia 15.  H/O Aortic Stenosis s/p bioprosthetic valve replacement  17. CAD with CABG at same time as valve replacement 18. Bradycardia     DISCUSSIONS AND DECISIONS: 1.  I was not able to talk in detail with pt due to her  lethargy and the late hour of my visit.  2.  I was informed that it would be best to talk with patient rather than son.    3.  I spoke with nursing and was updated as to pt/ son dynamics, as well as prior conversations about code status etc.  Will attempt to establish goals of care with pt / son soon.   4.  Pt is Full Code     REVIEW OF SYSTEMS:  Patient is not able to provide ROS  Due to illness and weakness.  She was alert but is now sleeping.   SPIRITUAL SUPPORT SYSTEM: Yes  --her step son is her caregiver and primary decision maker when she cannot make decisions.  SOCIAL HISTORY:  reports that she has never smoked. She does not have any smokeless tobacco history on file.  LEGAL DOCUMENTS:  NONE in chart  CODE STATUS: Full code   PAST MEDICAL HISTORY: Past Medical History  Diagnosis Date  . Tracheostomy in place   . Diabetes   . CKD (chronic kidney disease)   . OSA (obstructive sleep apnea)   . HTN (hypertension)   . MDD (major depressive disorder)     PAST SURGICAL HISTORY: History reviewed. No pertinent past surgical history.  ALLERGIES:  is allergic to codeine.  MEDICATIONS:  Current Facility-Administered Medications  Medication Dose Route Frequency Provider Last Rate Last Dose  . 0.9 %  sodium chloride infusion   Intravenous Continuous Enedina Finner, MD   Stopped at 01/01/15 0131  . acetaminophen (  TYLENOL) tablet 650 mg  650 mg Oral Q6H PRN Adrian Saran, MD   650 mg at 12/30/14 2117   Or  . acetaminophen (TYLENOL) suppository 650 mg  650 mg Rectal Q6H PRN Adrian Saran, MD      . acetylcysteine (MUCOMYST) 20 % nebulizer / oral solution 2 mL  2 mL Nebulization TID Shane Crutch, MD   2 mL at 01/01/15 0843  . alum & mag hydroxide-simeth (MAALOX/MYLANTA) 200-200-20 MG/5ML suspension 30 mL  30 mL Oral Q6H PRN Adrian Saran, MD      . apixaban (ELIQUIS) tablet 5 mg  5 mg Oral BID Adrian Saran, MD   5 mg at 01/01/15 1405  . atorvastatin (LIPITOR) tablet 80 mg  80 mg Oral  q1800 Ramonita Lab, MD   80 mg at 12/30/14 1818  . dextrose 5 %-0.9 % sodium chloride infusion   Intravenous Continuous Wyatt Haste, MD 75 mL/hr at 01/01/15 1700    . escitalopram (LEXAPRO) tablet 5 mg  5 mg Oral Daily Adrian Saran, MD   5 mg at 01/01/15 1404  . famotidine (PEPCID) tablet 40 mg  40 mg Oral Daily Enedina Finner, MD   40 mg at 01/01/15 1405  . feeding supplement (VITAL HIGH PROTEIN) liquid 1,000 mL  1,000 mL Per Tube Q24H Shane Crutch, MD      . fentaNYL (DURAGESIC - dosed mcg/hr) 50 mcg  50 mcg Transdermal Q72H Enid Baas, MD   50 mcg at 01/01/15 1510  . free water 200 mL  200 mL Per Tube 3 times per day Shane Crutch, MD      . furosemide (LASIX) tablet 40 mg  40 mg Oral Daily Merwyn Katos, MD   40 mg at 01/01/15 1405  . hydrALAZINE (APRESOLINE) injection 20 mg  20 mg Intravenous Q4H PRN Erin Fulling, MD   20 mg at 12/29/14 0350  . Influenza vac split quadrivalent PF (FLUARIX) injection 0.5 mL  0.5 mL Intramuscular Prior to discharge Delfino Lovett, MD      . insulin aspart (novoLOG) injection 0-5 Units  0-5 Units Subcutaneous QHS Adrian Saran, MD   Stopped at 12/31/14 2106  . insulin aspart (novoLOG) injection 0-9 Units  0-9 Units Subcutaneous TID WC Adrian Saran, MD   1 Units at 12/31/14 0908  . ipratropium-albuterol (DUONEB) 0.5-2.5 (3) MG/3ML nebulizer solution 3 mL  3 mL Nebulization Q2H PRN Cy Blamer, RPH      . ipratropium-albuterol (DUONEB) 0.5-2.5 (3) MG/3ML nebulizer solution 3 mL  3 mL Nebulization TID Shane Crutch, MD   3 mL at 01/01/15 0843  . isosorbide dinitrate (ISORDIL) tablet 30 mg  30 mg Oral Daily Enedina Finner, MD   30 mg at 01/01/15 1414  . LORazepam (ATIVAN) injection 1-2 mg  1-2 mg Intravenous Q4H PRN Enid Baas, MD   2 mg at 01/01/15 1525  . megestrol (MEGACE) 400 MG/10ML suspension 400 mg  400 mg Oral Daily Enedina Finner, MD   400 mg at 01/01/15 1059  . meropenem (MERREM) 1 g in sodium chloride 0.9 % 100 mL IVPB  1 g Intravenous  3 times per day Enedina Finner, MD   1 g at 01/01/15 1459  . multivitamin with minerals tablet 1 tablet  1 tablet Oral Daily Enedina Finner, MD   1 tablet at 01/01/15 1405  . ondansetron (ZOFRAN) tablet 4 mg  4 mg Oral Q6H PRN Adrian Saran, MD       Or  . ondansetron (ZOFRAN) injection 4  mg  4 mg Intravenous Q6H PRN Adrian Saran, MD   4 mg at 12/27/14 1155  . pneumococcal 23 valent vaccine (PNU-IMMUNE) injection 0.5 mL  0.5 mL Intramuscular Prior to discharge Delfino Lovett, MD      . QUEtiapine (SEROQUEL) tablet 50 mg  50 mg Oral QHS Adrian Saran, MD   Stopped at 12/31/14 2107  . senna-docusate (Senokot-S) tablet 1 tablet  1 tablet Oral BID Delfino Lovett, MD   1 tablet at 01/01/15 1405  . sodium chloride 0.9 % injection 10-40 mL  10-40 mL Intracatheter Q12H Enedina Finner, MD   10 mL at 01/01/15 1000  . sodium chloride 0.9 % injection 10-40 mL  10-40 mL Intracatheter PRN Enedina Finner, MD   10 mL at 12/27/14 1414    Vital Signs: BP 99/63 mmHg  Pulse 75  Temp(Src) 98.9 F (37.2 C) (Oral)  Resp 16  Ht  (1.702 m)  Wt 101.5 kg (223 lb 12.3 oz)  BMI 35.04 kg/m2  SpO2 100% Filed Weights   12/30/14 0416 12/31/14 0429 01/01/15 0445  Weight: 104.7 kg (230 lb 13.2 oz) 102.7 kg (226 lb 6.6 oz) 101.5 kg (223 lb 12.3 oz)    Estimated body mass index is 35.04 kg/(m^2) as calculated from the following:   Height as of this encounter:  (1.702 m).   Weight as of this encounter: 101.5 kg (223 lb 12.3 oz).  PERFORMANCE STATUS (ECOG) : 4 - Bedbound  PHYSICAL EXAM: NAD Eyes closed --groggy when I spoke to her OP clear Trache  Hrt irreg irreg Lungs ronchi and vent sounds Ext discolored lower legs Skin warm and dry    CXR 12/27/14: Enlargement of cardiac silhouette post cardiac surgery with pulmonary vascular congestion and slightly improved pulmonary edema. Bibasilar effusions persist.  _________________________________________________________  More than 50% of the visit was spent in  counseling/coordination of care: Yes  Time Spent: 60 minutes

## 2015-01-01 NOTE — Progress Notes (Addendum)
[facility] Critical Care Medicine Progess Note    ASSESSMENT/PLAN   69 yo obese white female with acute on chronic resp failure s/p trach with acute resp failure and acute cardiac arrest, now with left lower lobe pneumonia.   IMPRESSION:  Acute respiratory failure, vent dependence with mucous plug, left lower lobe atelectasis, left lower lobe pneumonia.  -The patient has been has not been able to be weaned off the ventilator for an extended period of time and will likely require continued vent dependence at least at night for the time being. We will attempt to see if she can tolerate trach collar during the day today. The patient has become bradycardic in the past during weaning trials. -ESBL UTI.   Altered mental status, suspect secondary to metabolic encephalopathy. -Mental status is improved this morning.  Chronic trach dependence, with chronic respiratory failure. -Chronic hypercapnic respiratory failure, likely due to COPD, emphysema, possibly obesity hypoventilation.  Acute on chronic diastolic congestive heart failure. -Continue furosemide at 20 mg once daily.  Pulmonary hypertension, likely group 2 and/or group. 3. Likely pulm edema/CHF, better.    Psedomonas/citrobacter tracheobronchitis vs PNA -Continue broad-spectrum antibiotics, infectious disease services, following.  Coronary artery disease, S/P CABG x 4 -Continue current regimen.  S/P aortic valve replacement with bioprosthetic valve -Currently on apixaban .   Nutrition. -Continue to advance diet as tolerated. -Continue Pepcid for GI prophylaxis.  Case discussed with critical care RN  and nutrition. We'll continue the weaning the vent as tolerated. It appears that the patient will continue to require nocturnal ventilatory support for the time being. We will await placement. Discussed with RN, patient could not tolerate feeding tube placement, discussed with radiology, will arrange for fluoroscopically  guided placement.  ---------------------------------------  CXR 12/29/14 image reviewed; continued LLL atelectasis/pneumonia.  ---------------------------------------- Case was discussed with the critical care RN, nutrition services, critical care pharmacist.  Name: ELLISE Brady MRN: [id number] DOB: February 10, 1946    ADMISSION DATE:  12/13/2014    CHIEF COMPLAINT: Dyspnea   STUDIES:  --   SUBJECTIVE:   Patient is sleepy but arousable on the ventilator.  Review of Systems:  Pt currently on the ventilator, can not provide history or review of systems.    VITAL SIGNS: Temp:  [97.7 F (36.5 C)-99.2 F (37.3 C)] 99.2 F (37.3 C) (09/23 0400) Pulse Rate:  [52-86] 77 (09/23 0600) Resp:  [16-21] 16 (09/23 0600) BP: (88-170)/(54-135) 105/63 mmHg (09/23 0600) SpO2:  [94 %-100 %] 98 % (09/23 0843) FiO2 (%):  [28 %] 28 % (09/23 0843) Weight:  [101.5 kg (223 lb 12.3 oz)] 101.5 kg (223 lb 12.3 oz) (09/23 0445) HEMODYNAMICS:   VENTILATOR SETTINGS: Vent Mode:  [-] PRVC FiO2 (%):  [28 %] 28 % Set Rate:  [16 bmp] 16 bmp Vt Set:  [500 mL] 500 mL PEEP:  [5 cmH20] 5 cmH20 INTAKE / OUTPUT:  Intake/Output Summary (Last 24 hours) at 01/01/15 0849 Last data filed at 01/01/15 0400  Gross per 24 hour  Intake 454.68 ml  Output   1542 ml  Net -1087.32 ml    PHYSICAL EXAMINATION: Physical Examination:   VS: BP 105/63 mmHg  Pulse 77  Temp(Src) 99.2 F (37.3 C) (Oral)  Resp 16  Ht  (1.702 m)  Wt 101.5 kg (223 lb 12.3 oz)  BMI 35.04 kg/m2  SpO2 98%  General Appearance: No distress  Neuro:without focal findings, lethargic but arousable. HEENT: PERRLA, EOM intact. Pulmonary: normal breath sounds   CardiovascularNormal S1,S2.  No  m/r/g.   Abdomen: Benign, Soft, non-tender. Renal:  No costovertebral tenderness  GU:  Not performed at this time. Endocrine: No evident thyromegaly. Skin:   warm, no rashes, no ecchymosis  Extremities: normal, no cyanosis,  clubbing.   LABS:   LABORATORY PANEL:   CBC  Recent Labs Lab 01/01/15 0444  WBC 7.3  HGB 8.7*  HCT 27.3*  PLT 112*    Chemistries   Recent Labs Lab 12/31/14 0352  NA 145  K 3.8  CL 103  CO2 36*  GLUCOSE 154*  BUN 25*  CREATININE 1.06*  CALCIUM 11.9*     Recent Labs Lab 12/31/14 2128 12/31/14 2129 12/31/14 2257 01/01/15 0012 01/01/15 0406 01/01/15 0713  GLUCAP 68 98 80 59* 83 74    Recent Labs Lab 12/27/14 2248 12/29/14 0419  PHART 7.35 7.54*  PCO2ART 57* 45  PO2ART 63* 80*   No results for input(s): AST, ALT, ALKPHOS, BILITOT, ALBUMIN in the last 168 hours.  Cardiac Enzymes  Recent Labs Lab 12/28/14 1208  TROPONINI 0.08*    RADIOLOGY:  Dg Chest 1 View  12/31/2014   CLINICAL DATA:  Patient with sepsis.  Followup pneumonia.  EXAM: CHEST 1 VIEW  COMPARISON:  12/29/2014 chest radiograph  FINDINGS: Patient is rotated to the left. Tracheostomy tube stable in position. Status post median sternotomy. Stable markedly enlarged cardiac and mediastinal contours. Re- demonstrated moderate left-greater-than-right pleural effusions with underlying pulmonary consolidation. No definite pneumothorax. Left upper extremity PICC line tip projects over the central left brachiocephalic vein. Multiple old rib fractures.  IMPRESSION: Stable marked cardiomegaly with persistent moderate bilateral pleural effusions and underlying pulmonary consolidation suggestive of atelectasis and edema. Infection not excluded.   Electronically Signed   By: Annia Belt M.D.   On: 12/31/2014 13:18       --Wells Guiles, MD.  Pager [phone] [place] [facility] Office Number: [phone]  Santiago Glad, M.D.  Stephanie Acre, M.D.  Billy Fischer, M.D  Critical Care Attestation.  I have personally obtained a history, examined the patient, evaluated laboratory and imaging results, formulated the assessment and plan and placed orders. The Patient requires high  complexity decision making for assessment and support, frequent evaluation and titration of therapies, application of advanced monitoring technologies and extensive interpretation of multiple databases. The patient has critical illness that could lead imminently to failure of 1 or more organ systems and requires the highest level of physician preparedness to intervene.  Critical Care Time devoted to patient care services described in this note is 35 minutes and is exclusive of time spent in procedures.

## 2015-01-01 NOTE — Progress Notes (Addendum)
Adult (Non-Pregnant) Hypoglycemia Protocol Treatment Guidelines  1.  RN shall initiate Hypoglycemia Protocol emergency measures immediately when:            w Routine or STAT CBG and/or a lab glucose indicates hypoglycemia (CBG < 70 mg/dl)  2.  Treat the patient according to ability to take PO's and severity of hypoglycemia.   3.  If patient is on GlucoStabilizer, follow directions provided by the GlucoStabilizer for hypoglycemic events.  4.  If patient on insulin pump, follow Hypoglycemia Protocol.  If patient requires more than one treatment have patient place pump in SUSPEND and notify MD.  DO NOT leave pump in SUSPEND for greater than 30 minutes unless ordered by MD.  A.  Treatment for Mild or Moderate-Patient cooperative and able to swallow    1.  Patient taking PO's and can cooperate   a.  Give one of the following 15 gram CHO options:                           w 1 tube oral dextrose gel                           w 3-4 Glucose tablets                           w 4 oz. Juice                           w 4 oz. regular soda                                    ESRD patients:  clear, regular soda                           w 8 oz. skim milk    b.  Recheck CBG in 15 minutes after treatment                            w If CBG < 70 mg/dl, repeat treatment and recheck until hypoglycemia is resolved                            w If CBG > 70 mg/dl and next meal is more than 1 hour away, give additional 15 grams CHO   2.  Patient NPO-Patient cooperative and no altered mental status    a.  Give 25 ml of D50 IV.   b.  Recheck CBG in 15 minutes after treatment.                             w If CBG is less than 70 mg/dl, repeat treatment and recheck until hypoglycemia is resolved.   c.  Notify MD for further orders.             SPECIAL CONSIDERATIONS:    a.  If no IV access,                              w Start IV of D5W at KVO                               w Give 25 ml of D50 IV.    b.  If  unable to gain IV access                             w Give Glucagon IM:    i.  1 mg if patient weighs more than 45.5 kg    ii.  0.5 mg if patient weighs less than 45.5 kg   c.  Notify MD for further orders  B.  Treatment for Severe-- Patient unconscious or unable to take PO's safely    1.  Position patient on side   2.  Give 50 ml D50 IV   3.  Recheck CBG in 15 minutes.                    w If CBG is less than 70 mg/dl, repeat treatment and recheck until hypoglycemia is resolved.   4.  Notify MD for further orders.    SPECIAL CONSIDERATIONS:    a.  If no IV access                              w Give Glucagon IM                                        i.  1 mg if patient weighs more than 45.5 kg                                       ii.  0.5 mg if patient weighs less than 45.5 kg                              w Start IV of D5W at 50 ml/hr and give 50 ml D50 IV   b.  If no IV access and active seizure                               w Call Rapid Response   c.  If unable to gain IV access, give Glucagon IM:                              w 1 mg if patient weighs more than 45.5 kg                              w 0.5 mg if patient weighs less than 45.5 kg   d.  Notify MD for further orders.  C.  Complete smart text progress note to document intervention and follow-up CBG   1.  In [facility] patient chart, click on Notes (left side of screen)   2.  Create Progress Note   3.  Click on [employer].  In the Match box type "hypo" and enter    4.  Double click on CHL IP HYPOGLYCEMIC EVENT and enter data   5.  MD must be notified if patient is NPO or experienced severe hypoglycemia Adult (Non-Pregnant) Hypoglycemia Protocol Treatment Guidelines  1.  RN shall initiate Hypoglycemia Protocol emergency measures immediately when:            w Routine or STAT CBG and/or a lab glucose indicates hypoglycemia (CBG < 70 mg/dl)  2.  Treat the patient according to ability to take PO's and severity of  hypoglycemia.   3.  If patient is on GlucoStabilizer, follow directions provided by the [facility] for hypoglycemic events.  4.  If patient on insulin pump, follow Hypoglycemia Protocol.  If patient requires more than one treatment have patient place pump in SUSPEND and notify MD.  DO NOT leave pump in SUSPEND for greater than 30 minutes unless ordered by MD.  A.  Treatment for Mild or Moderate-Patient cooperative and able to swallow    1.  Patient taking PO's and can cooperate   a.  Give one of the following 15 gram CHO options:                           w 1 tube oral dextrose gel                           w 3-4 Glucose tablets                           w 4 oz. Juice                           w 4 oz. regular soda                                    ESRD patients:  clear, regular soda                           w 8 oz. skim milk    b.  Recheck CBG in 15 minutes after treatment                            w If CBG < 70 mg/dl, repeat treatment and recheck until hypoglycemia is resolved                            w If CBG > 70 mg/dl and next meal is more than 1 hour away, give additional 15 grams CHO   2.  Patient NPO-Patient cooperative and no altered mental status    a.  Give 25 ml of D50 IV.   b.  Recheck CBG in 15 minutes after treatment.                             w If CBG is less than 70 mg/dl, repeat treatment and recheck until hypoglycemia is resolved.   c.  Notify MD for further orders.             SPECIAL CONSIDERATIONS:    a.  If no IV access,                              w Start IV of D5W at [facility]  w Give 25 ml of D50 IV.    b.  If unable to gain IV access                             w Give Glucagon IM:    i.  1 mg if patient weighs more than 45.5 kg    ii.  0.5 mg if patient weighs less than 45.5 kg   c.  Notify MD for further orders  B.  Treatment for Severe-- Patient unconscious or unable to take PO's safely    1.  Position patient on  side   2.  Give 50 ml D50 IV   3.  Recheck CBG in 15 minutes.                    w If CBG is less than 70 mg/dl, repeat treatment and recheck until hypoglycemia is resolved.   4.  Notify MD for further orders.    SPECIAL CONSIDERATIONS:    a.  If no IV access                              w Give Glucagon IM                                        i.  1 mg if patient weighs more than 45.5 kg                                       ii.  0.5 mg if patient weighs less than 45.5 kg                              w Start IV of D5W at 50 ml/hr and give 50 ml D50 IV   b.  If no IV access and active seizure                               w Call Rapid Response   c.  If unable to gain IV access, give Glucagon IM:                              w 1 mg if patient weighs more than 45.5 kg                              w 0.5 mg if patient weighs less than 45.5 kg   d.  Notify MD for further orders.  C.  Complete smart text progress note to document intervention and follow-up CBG   1.  In [facility] patient chart, click on Notes (left side of screen)   2.  Create Progress Note   3.  Click on [employer].  In the Match box type "hypo" and enter    4.  Double click on CHL IP HYPOGLYCEMIC EVENT and enter data   5.  MD must be notified if patient is NPO or experienced severe hypoglycemia  PROTOCOL FOLLOWED AS ORDRED. MD MADE AWARE

## 2015-01-01 NOTE — Progress Notes (Signed)
Palliative Medicine Inpatient Consult Follow Up Note   Name: Rachel Brady Date: 01/01/2015 MRN: [id number]  DOB: 1945-11-02  Referring Physician: Enid Baas, MD  Palliative Care consult requested for this 69 y.o. female for goals of medical therapy in patient with acute respiratory failure and problems as outlined.  IMPRESSION: 1. 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 UTI with resultant sepsis ---ESBL e coli bacteria = causative organism 3. Respiratory arrest (she quit breathing in ambulance and was bagged ) 4. Cardia arrest on 12/27/14 with full code blue (EPI X plus CPR resulted in recovery)  5. H/O substance abuse but details are unclear 6. Essential HTN 7. Obesity 8. H/O major depressive disorder 9. CKD stage 3 10. DM2 11.Atrial Fibrillation (nonvalvular per cardiology despite past AVR) 12. GERD 13. Metabolic Encephalopathy from chronic and acute illness 14. Tachycardia ---due to demand ischemia 15. H/O Aortic Stenosis s/p bioprosthetic valve replacement  17. CAD with CABG at same time as valve replacement 18. Bradycardia  19.  Moderate malnutrition 20.  Anemia of chronic disease 21.  Aging and inadequate immune system is thought likely due to chronic illnesses. 22.  Mild thrombocytopenia 23. Elevated liver function labs due to sepsis 24. She has HYPERCALCEMIA.  I am not clear on the cause.     DISCUSSION,  DECISIONS, AND PLANS  1.  I spoke at length and listened intently when I called pts son.  He is actually her step son but he has lived with her since he was 18 and he is her only care giver and decision maker when she cannot make decisions.  He states that he is legally her HCPOA --but he does not know where the form is located.  I asked him to try to locate this for the paper chart and told him that ideally, he really needs to show this form anytime she goes anywhere.   NEED HCPOA form if it can be located.  2.  Pt is to stay FULL CODE.  Pts son takes a long time to provide details in conversation, but after listening a long time, I can see why he is keeping her FULL CODE. It is basically to honor HER WISHES as SHE HAS EXPRESSED TO HIM.  He quoted many words she has said to him only last week.  She has said to him, " Don't give up on me.  Do NOT let them pull the plug!" etc.   I spoke later about the difference between pulling the plug and just establishing a simple DNR status.  I explained this in a way he could understand and he did seem to appreciate the difference between 'giving up' and allowing someone who is dead to remain dead.  However, he says she will have to be the one to talk to [place] about this when she is better oriented.    3. We talked some about placement, quality of life, and ventilation.  He understands better about the current approach to ventilation. He feels strongly that she 'needs to be  on a vent every night' in order to have any quality of life. He seems to think that this will prevent a relapse or another pneumonia.  I educated him that her LACK OF A ROBUST WORKING IMMUNE SYSTEM is going to mean pneumonias OFTEN and being on the vent won't prevent that.  But he does not want her weaned to CPAP only again anytime soon if ever.     REVIEW OF SYSTEMS:  Patient is not able to provide ROS due to critical illness  CODE STATUS: Full code still --not able to talk with pt now due to her  being sedated and again ventilated --and did not reach son via phone.     PAST MEDICAL HISTORY: Past Medical History  Diagnosis Date  . Tracheostomy in place   . Diabetes   . CKD (chronic kidney disease)   . OSA (obstructive sleep apnea)   . HTN (hypertension)   . MDD (major depressive disorder)     PAST SURGICAL HISTORY: History reviewed. No pertinent past surgical history.  Vital Signs: BP 105/63 mmHg  Pulse 77  Temp(Src) 99.2 F (37.3 C) (Oral)  Resp 16  Ht  (1.702 m)  Wt 101.5 kg (223 lb 12.3 oz)  BMI 35.04 kg/m2  SpO2 94% Filed Weights   12/30/14 0416 12/31/14 0429 01/01/15 0445  Weight: 104.7 kg (230 lb 13.2 oz) 102.7 kg (226 lb 6.6 oz) 101.5 kg (223 lb 12.3 oz)    Estimated body mass index is 35.04 kg/(m^2) as calculated from the following:   Height as of this encounter:  (1.702 m).   Weight as of this encounter: 101.5 kg (223 lb 12.3 oz).  PHYSICAL EXAM Sedated and ventilated via trache Eyes closed Nares patent Lips moist Trache  Hrt rrr no m heard Lungs with ronchi and vent sounds Abd soft with nl BS Ext with dark colored bilateral lower legs Skin warm and dry  LABS: CBC:    Component Value Date/Time   WBC 7.3 01/01/2015 0444   WBC 13.8* 08/02/2014 1918   HGB 8.7* 01/01/2015 0444   HGB 9.6* 08/02/2014 1918   HCT 27.3* 01/01/2015 0444   HCT 30.2* 08/02/2014 1918   PLT 112* 01/01/2015 0444   PLT 190 08/02/2014 1918   MCV 84.5 01/01/2015 0444   MCV 91 08/02/2014 1918   NEUTROABS 8.2* 12/13/2014 0925   NEUTROABS 12.8* 08/02/2014 1918   LYMPHSABS 0.3* 12/13/2014 0925   LYMPHSABS 0.2* 08/02/2014 1918   MONOABS 0.3 12/13/2014 0925   MONOABS 0.8 08/02/2014 1918   EOSABS 0.1 12/13/2014 0925   EOSABS 0.0 08/02/2014 1918   BASOSABS 0.0 12/13/2014 0925   BASOSABS 0.0 08/02/2014 1918   Comprehensive Metabolic Panel:    Component Value Date/Time   NA 145 12/31/2014 0352   NA 134* 08/02/2014 1918   K 3.8 12/31/2014 0352   K 5.9*  08/02/2014 1918   CL 103 12/31/2014 0352   CL 95* 08/02/2014 1918   CO2 36* 12/31/2014 0352   CO2 31 08/02/2014 1918   BUN 25* 12/31/2014 0352   BUN 44* 08/02/2014 1918   CREATININE 1.06* 12/31/2014 0352   CREATININE 1.61* 08/02/2014 1918   GLUCOSE 154* 12/31/2014 0352   GLUCOSE 158* 08/02/2014 1918   CALCIUM 11.9* 12/31/2014 0352   CALCIUM 10.9* 08/02/2014 1918   CALCIUM 9.6 04/23/2011 0539   AST 32 12/13/2014 0925   AST 33 08/02/2014 1918   ALT 87* 12/13/2014 0925   ALT 27 08/02/2014 1918  ALKPHOS 128* 12/13/2014 0925   ALKPHOS 133* 08/02/2014 1918   BILITOT 0.5 12/13/2014 0925   BILITOT 0.5 08/02/2014 1918   PROT 6.8 12/13/2014 0925   PROT 7.2 08/02/2014 1918   ALBUMIN 3.1* 12/13/2014 0925   ALBUMIN 3.0* 08/02/2014 1918    More than 50% of the visit was spent in counseling/coordination of care: YES  Time Spent: 60 min (prolonged visit time and complex counselling greater than 50% of visit)

## 2015-01-01 NOTE — Progress Notes (Signed)
Pt restraints d/c per protocol, pt calm and sleeping, nursing will cont to monitor

## 2015-01-01 NOTE — Progress Notes (Signed)
   01/01/15 1029  Vent Select  Invasive or Noninvasive Invasive  Adult Vent Y  [REMOVED] Tracheostomy  Removal Date/Time: (c) 12/14/14 (c) 0800  No Placement Date or Time found.   Inserted prior to hospital arrival?: (c) Yes  Status Secured  Emergency Equipment at bedside Yes  Adult Ventilator Settings  Vent Type Servo i  Humidity HME  Vent Mode PSV  FiO2 (%) 28 %  Pressure Support 14 cmH20  PEEP 5 cmH20  Adult Ventilator Measurements  Peak Airway Pressure 17 L/min  Mean Airway Pressure 10 cmH20  Resp Rate Spontaneous 28 br/min  Resp Rate Total 28 br/min  Exhaled Vt 348 mL  Measured Ve 8.7 mL  HOB> 30 Degrees Y  Adult Ventilator Alarms  Alarms On Y  Ve High Alarm 20 L/min  Ve Low Alarm 5 L/min  Resp Rate High Alarm 35 br/min  Resp Rate Low Alarm 5  PEEP Low Alarm 2 cmH2O  Press High Alarm 50 cmH2O  T Apnea 20 sec(s)  Placed patient on PSV 14/5 35% per MD request.  Patient is tolerating well.  RN aware.

## 2015-01-01 NOTE — Progress Notes (Signed)
[facility] Physicians - [place] at [facility]   PATIENT NAME: Rachel Brady    MR#:  [id number]  DATE OF BIRTH:  Jan 09, 1946  SUBJECTIVE:  - Patient with chronic trach, on a trach collar. But here has been needing continuous vent support due to acute respiratory failure. -Also noted to be bradycardic on admission which has resolved. -Continues to be confused, mittens to hands placed. Trying to get out of bed.  REVIEW OF SYSTEMS:  Review of Systems  Unable to perform ROS due to critical illness  DRUG ALLERGIES:   Allergies  Allergen Reactions  . Codeine     Has tolerated oxycodone in the past.   VITALS:  Blood pressure 108/65, pulse 92, temperature 99 F (37.2 C), temperature source Oral, resp. rate 19, height  (1.702 m), weight 101.5 kg (223 lb 12.3 oz), SpO2 100 %. PHYSICAL EXAMINATION:  Physical Exam  Constitutional: critically ill ,patient is  On full vent support Very confused and trying to get out of bed. Moving all extremities Head: Normocephalic and atraumatic.  Trach in place , and connected to vent. Dobhoff tube in place Eyes: Conjunctivae are normal. Pupils are equal, round, and reactive to light.  Neck: Normal range of motion. Neck supple. No tracheal deviation present. No thyromegaly present.  trach collar is connected to vent. Cardiovascular: Normal rate, regular rhythm and normal heart sounds.   Pulmonary/Chest: Bilateral breath sounds present She has no wheezes. Coarse breath sounds at the bases Abdominal: Soft. Bowel sounds are normal. She exhibits no distension. There is no tenderness.  Musculoskeletal: Normal range of motion.  Neurological: No cranial nerve deficit.  moving all 4 extremities in bed. Not following commands. Skin: Skin is warm and dry. No rash noted.  Psychiatric: Very confused, alert. Not verbal.   LABORATORY PANEL:   CBC  Recent Labs Lab 01/01/15 0444  WBC 7.3  HGB 8.7*  HCT 27.3*  PLT 112*    ------------------------------------------------------------------------------------------------------------------ Chemistries   Recent Labs Lab 12/31/14 0352  NA 145  K 3.8  CL 103  CO2 36*  GLUCOSE 154*  BUN 25*  CREATININE 1.06*  CALCIUM 11.9*   RADIOLOGY:  Dg Naso G Tube Plc W/fl W/rad  01/01/2015   CLINICAL DATA:  Dobbhoff tube placement.  EXAM: NASO G TUBE PLACEMENT WITH FL AND WITH RAD  FLUOROSCOPY TIME:  Radiation Exposure Index (as provided by the fluoroscopic device): 7.5 mGy  COMPARISON:  None.  FINDINGS: Fluoroscopic guided Dobbhoff tube placement. Contact precautions were observed. The tip of the Dobhoff tube was lubricated. The tube was advanced into the body of the stomach without difficulty.  IMPRESSION: Successful placement of a Dobbhoff tube with the tip projecting over the body of the stomach.   Electronically Signed   By: Elige Ko   On: 01/01/2015 13:51   ASSESSMENT AND PLAN:   69 year old female with a history of tracheostomy, atrial fibrillation, diabetes who presents with sepsis.  1. Sepsis with hypotension: secondary to urinary tract infection with ESBL ecoli Hypotension resolved  Appreciate ID input. Continue meropenem for a total of 14 days until 01/01/2015 as per ID recommendations. 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 ESBL E.coli Urinary tract infection: Meropenem as above  . Lasted today  3. Healthcare acquired pneumonia: Remains on ventilator. - Continue meropenem as trach suction sputum growing Pseudomonas  - Citrobacter and MRSA-possible: Isolation. No fevers.  4. Acute  respiratory failure: acute on chronic -Actually secondary to her pneumonia. Continue vent support. -Appreciate pulmonary consult. -Discharge to long-term acute care facility next week  -5. Atrial fibrillation: Bradycardic, so medications on hold. -Eliquis for  anticoagulation  6. Delirium-patient was conversational on admission, currently nonverbal and very confused. -If no improvement, repeat CT head. No focal deficits noted. -CT on admission with no acute deficits.   7. Depression/anxiety: Lexapro and Seroquel. Anxiolytics as needed-added Ativan  8. Fungal skin infection: continue nystatin.  9. Nutrition- since Dobbhoff tube placed, we'll need to start tube feeds. Appreciate dietary consult. -On D5 until then.  Patient critically ill and high risk for cardiorespiratory arrest. Appreciate palliative care consult.  CODE STATUS: Full code  TOTAL critical TIME  TAKING CARE OF THIS PATIENT: 36 minutes.   Enid Baas M.D on 01/01/2015 at 4:26 PM  Between 7am to 6pm - Pager - [phone]  After 6pm go to [email or web address] - password [email or web address] [facility]  [place] [facility]  Office  [phone]  CC: Primary care physician; Dorothey Baseman, MD

## 2015-01-01 NOTE — Progress Notes (Signed)
   01/01/15 1343  Restraint Order  Length of Order Daily  Restraint Status START  Assessment  Less Restrictive Interventions Active listening;Decrease stimulation (calming techniques);Diversional activities;Observation;Medicated;Limit setting (adjust lighting in room, reduce environmental noise);Provide reassurance;Reorient patient;Re-evaluate equipment (appropriately applied);Repositioning  Diversional Activities Patient education TV channels;Linen to fold/paper to tear  Health history reviewed prior to applying restraint Yes  Justification  Clinical Justification Pulling lines;Pulling tubes;Removal of equipment  Plan to Progress Out Reality orientation;Reevaluate safety plan;Interdisciplinary collaboration;Continue safety plan  Education  Discontinuation Criteria Alternative intervention effective;Responding to safe limit settings;No longer interfering with care;Follows instructions  Discontinuation Criteria Explained Yes  Family Notification Family not available at this time  Non-violent Restraints  Soft Restraint Right Wrist Initiated  Soft Restraint Left Wrist Initiated  Side Rails Up x 4 (with order) Initiated  soft wrist restraints and 4 side rails placed due to pt attempting to pull off vent and pull out dobhoff tube, pt not following safety commands, nursing will cont to monitor

## 2015-01-01 NOTE — Progress Notes (Signed)
Inpatient Diabetes Program Recommendations  AACE/ADA: New Consensus Statement on Inpatient Glycemic Control (2015)  Target Ranges:  Prepandial:   less than 140 mg/dL      Peak postprandial:   less than 180 mg/dL (1-2 hours)      Critically ill patients:  140 - 180 mg/dL   Review of Glycemic Control  Diabetes history: Type 2  Current orders for Inpatient glycemic control: Novolog sensitive 0-9 units tid, Novolog 0-5 units qhs  Inpatient Diabetes Program Recommendations:  Once the feedings are started, please change the correction insulin to Novolog to 0-9 units q4h   Susette Racer, RN, [place], [place], CDE Diabetes Coordinator Inpatient Diabetes Program  [phone] (Team Pager) [phone] [facility] Office) 01/01/2015 1:50 PM

## 2015-01-01 NOTE — Clinical Social Work Note (Signed)
Plan remains for patient to transfer to [facility] next week (more mid to late week) when they have a bed become available. Will continue to follow. York Spaniel MSW,LCSW [phone]

## 2015-01-01 NOTE — Progress Notes (Signed)
This RN with 2nd RN attempted placement to dobhoff, pt unable to tol even with rest periods, will notify MD for plan

## 2015-01-02 ENCOUNTER — Inpatient Hospital Stay: Payer: Medicare Other

## 2015-01-02 LAB — BASIC METABOLIC PANEL
ANION GAP: 4 — AB (ref 5–15)
BUN: 19 mg/dL (ref 6–20)
CHLORIDE: 105 mmol/L (ref 101–111)
CO2: 36 mmol/L — AB (ref 22–32)
Calcium: 10.9 mg/dL — ABNORMAL HIGH (ref 8.9–10.3)
Creatinine, Ser: 0.93 mg/dL (ref 0.44–1.00)
GFR calc non Af Amer: >60 mL/min (ref >60)
GLUCOSE: 99 mg/dL (ref 65–99)
Potassium: 3.3 mmol/L — ABNORMAL LOW (ref 3.5–5.1)
Sodium: 145 mmol/L (ref 135–145)

## 2015-01-02 LAB — GLUCOSE, CAPILLARY
GLUCOSE-CAPILLARY: 107 mg/dL — AB (ref 65–99)
GLUCOSE-CAPILLARY: 115 mg/dL — AB (ref 65–99)
GLUCOSE-CAPILLARY: 87 mg/dL (ref 65–99)
GLUCOSE-CAPILLARY: 89 mg/dL (ref 65–99)
GLUCOSE-CAPILLARY: 90 mg/dL (ref 65–99)
Glucose-Capillary: 134 mg/dL — ABNORMAL HIGH (ref 65–99)
Glucose-Capillary: 61 mg/dL — ABNORMAL LOW (ref 65–99)

## 2015-01-02 LAB — CBC
HEMATOCRIT: 29.1 % — AB (ref 35.0–47.0)
HEMOGLOBIN: 9.2 g/dL — AB (ref 12.0–16.0)
MCH: 26.5 pg (ref 26.0–34.0)
MCHC: 31.6 g/dL — ABNORMAL LOW (ref 32.0–36.0)
MCV: 83.9 fL (ref 80.0–100.0)
Platelets: 127 10*3/uL — ABNORMAL LOW (ref 150–440)
RBC: 3.47 MIL/uL — ABNORMAL LOW (ref 3.80–5.20)
RDW: 20.6 % — AB (ref 11.5–14.5)
WBC: 7.5 10*3/uL (ref 3.6–11.0)

## 2015-01-02 LAB — URINALYSIS COMPLETE WITH MICROSCOPIC (ARMC ONLY)
BILIRUBIN URINE: NEGATIVE
Bacteria, UA: NONE SEEN
GLUCOSE, UA: 50 mg/dL — AB
Ketones, ur: NEGATIVE mg/dL
NITRITE: NEGATIVE
Protein, ur: 100 mg/dL — AB
SPECIFIC GRAVITY, URINE: 1.018 (ref 1.005–1.030)
pH: 5 (ref 5.0–8.0)

## 2015-01-02 MED ORDER — STERILE WATER FOR INJECTION IJ SOLN
INTRAMUSCULAR | Status: AC
  Administered 2015-01-02: 15:00:00
  Filled 2015-01-02: qty 10

## 2015-01-02 MED ORDER — LORAZEPAM 2 MG/ML IJ SOLN: 0.5000 mg | INTRAMUSCULAR | Status: DC | PRN

## 2015-01-02 MED ORDER — FREE WATER
200.0000 mL | Status: DC
  Administered 2015-01-02 – 2015-01-04 (×14): 200 mL

## 2015-01-02 MED ORDER — IPRATROPIUM-ALBUTEROL 0.5-2.5 (3) MG/3ML IN SOLN
3.0000 mL | Freq: Four times a day (QID) | RESPIRATORY_TRACT | Status: DC
  Administered 2015-01-02 – 2015-01-04 (×8): 3 mL via RESPIRATORY_TRACT
  Filled 2015-01-02 (×8): qty 3

## 2015-01-02 MED ORDER — ALTEPLASE 2 MG IJ SOLR
2.0000 mg | Freq: Once | INTRAMUSCULAR | Status: AC
  Administered 2015-01-02: 2 mg
  Filled 2015-01-02: qty 2

## 2015-01-02 MED ORDER — INSULIN ASPART 100 UNIT/ML ~~LOC~~ SOLN
0.0000 [IU] | SUBCUTANEOUS | Status: DC
  Administered 2015-01-02 – 2015-01-03 (×2): 1 [IU] via SUBCUTANEOUS
  Administered 2015-01-04: 2 [IU] via SUBCUTANEOUS
  Filled 2015-01-02: qty 2
  Filled 2015-01-02 (×2): qty 1

## 2015-01-02 MED ORDER — QUETIAPINE FUMARATE 25 MG PO TABS
50.0000 mg | ORAL_TABLET | Freq: Every day | ORAL | Status: DC
  Administered 2015-01-02 – 2015-01-03 (×2): 50 mg
  Filled 2015-01-02 (×2): qty 2

## 2015-01-02 NOTE — Progress Notes (Signed)
Dr. Nemiah Commander notified of dobhoff placement in duodenum. No new orders, RN to continue with current tube feeding and medication orders per MD.

## 2015-01-02 NOTE — Progress Notes (Signed)
Nutrition Follow-up   INTERVENTION:   EN: recommend titrating TF as tolerated to goal rate as per order set   NUTRITION DIAGNOSIS:   Inadequate oral intake related to acute illness as evidenced by NPO status. Being addressed via TF  GOAL:   Patient will meet greater than or equal to 90% of their needs  MONITOR:    (Energy Intake, Pulmonary, Digestive System, Anthropometrics, Electrolyte/Renal Profile, Glucose Profile)  REASON FOR ASSESSMENT:   Consult Enteral/tube feeding initiation and management  ASSESSMENT:   Pt remains on vent via trach, trach collar trials as tolerated  EN: tolerating Vital High Protein at rate of 40 ml/hr this AM, goal rate of 55 ml/hr, free water changed to q 4 hours  Digestive System: no signs of TF intolerance; dobhoff placed under fleuro yesterday, xray repeated this AM to confirm placement with tube in duodenum  Skin:   (stage II buttock)  Last BM:  9/22   Electrolyte and Renal Profile:  Recent Labs Lab 12/30/14 0545 12/31/14 0352 01/02/15 0335  BUN 24* 25* 19  CREATININE 1.08* 1.06* 0.93  NA 144 145 145  K 3.8 3.8 3.3*   Glucose Profile:  Recent Labs  01/02/15 0332 01/02/15 0731 01/02/15 1122  GLUCAP 87 89 115*   Meds: ss novolog, lasix  Height:   Ht Readings from Last 1 Encounters:  12/13/14  (1.702 m)    Weight:   Wt Readings from Last 1 Encounters:  01/02/15 227 lb 11.8 oz (103.3 kg)    BMI:  Body mass index is 35.66 kg/(m^2).  Estimated Nutritional Needs:   Kcal:  0981-1914 kcals (11-14 kcals/kg)   Protein:  122-153 g (2.0-2.5 g/kg) using IBW 61 kg  Fluid:  1525-1830 mL (25-30 ml/kg)   EDUCATION NEEDS:   No education needs identified at this time  HIGH Care Level  Romelle Starcher MS, RD, LDN [phone] Pager

## 2015-01-02 NOTE — Progress Notes (Signed)
MD called back about page for dobhoff. MD to order abdominal x-ray to confirm that 85 cm at nare is correct placement for dobhoff from here on.

## 2015-01-02 NOTE — Progress Notes (Signed)
[facility] Physicians - [place] at [facility]   PATIENT NAME: Rachel Brady    MR#:  [id number]  DATE OF BIRTH:  1946/02/11  SUBJECTIVE:  - Patient with chronic trach, on a trach collar. But here has been needing continuous vent support due to acute respiratory failure. -Also noted to be bradycardic on admission which has resolved. -sleeping today, received ativan post midnight, on 40% trach collar this morning  REVIEW OF SYSTEMS:  Review of Systems  Unable to perform ROS due to critical illness  DRUG ALLERGIES:   Allergies  Allergen Reactions  . Codeine     Has tolerated oxycodone in the past.   VITALS:  Blood pressure 100/61, pulse 83, temperature 97.8 F (36.6 C), temperature source Axillary, resp. rate 18, height  (1.702 m), weight 103.3 kg (227 lb 11.8 oz), SpO2 93 %. PHYSICAL EXAMINATION:  Physical Exam  Constitutional: critically ill ,patient is  On trach collar today Lethargic, sedated this am Head: Normocephalic and atraumatic.  Trach in place ,  Dobhoff tube in place Eyes: Conjunctivae are normal. Pupils are equal, round, and reactive to light.  Neck: Normal range of motion. Neck supple. No tracheal deviation present. No thyromegaly present.  trach collar is connected to vent. Cardiovascular: Normal rate, regular rhythm and normal heart sounds.   Pulmonary/Chest: Bilateral breath sounds present She has no wheezes. Coarse breath sounds at the bases, scant breath sounds overall Abdominal: Soft. Bowel sounds are normal. She exhibits no distension. There is no tenderness.  Musculoskeletal: Normal range of motion.  Neurological: No cranial nerve deficit.  Sedated,  Not following commands. Skin: Skin is warm and dry. No rash noted.  Psychiatric: Very confused, alert. Not verbal.   LABORATORY PANEL:   CBC  Recent Labs Lab 01/02/15 0335  WBC 7.5  HGB 9.2*  HCT 29.1*  PLT 127*    ------------------------------------------------------------------------------------------------------------------ Chemistries   Recent Labs Lab 01/02/15 0335  NA 145  K 3.3*  CL 105  CO2 36*  GLUCOSE 99  BUN 19  CREATININE 0.93  CALCIUM 10.9*   RADIOLOGY:  Dg Abd Portable 1v  01/02/2015   CLINICAL DATA:  NG tube placement.  EXAM: PORTABLE ABDOMEN - 1 VIEW  COMPARISON:  01/01/2015 fluoroscopic images. 12/17/2014 abdominal radiograph.  FINDINGS: Weighted enteric tube appears to have advanced some in the interim with tip likely now over the second/third portion of the duodenum just right of midline. No dilated loops of bowel are seen.  As seen on recent chest radiographs, there is cardiomegaly with bilateral pleural effusions, incompletely evaluated.  IMPRESSION: Interval advancement of feeding tube into the duodenum.   Electronically Signed   By: Sebastian Ache M.D.   On: 01/02/2015 09:35   Dg Basil Dess Tube Plc W/fl W/rad  01/01/2015   CLINICAL DATA:  Dobbhoff tube placement.  EXAM: NASO G TUBE PLACEMENT WITH FL AND WITH RAD  FLUOROSCOPY TIME:  Radiation Exposure Index (as provided by the fluoroscopic device): 7.5 mGy  COMPARISON:  None.  FINDINGS: Fluoroscopic guided Dobbhoff tube placement. Contact precautions were observed. The tip of the Dobhoff tube was lubricated. The tube was advanced into the body of the stomach without difficulty.  IMPRESSION: Successful placement of a Dobbhoff tube with the tip projecting over the body of the stomach.   Electronically Signed   By: Elige Ko   On: 01/01/2015 13:51   ASSESSMENT AND PLAN:   69 year old female with a history of tracheostomy, atrial fibrillation, diabetes who presents with sepsis.  1. Sepsis with hypotension: secondary to urinary tract infection with ESBL ecoli Hypotension resolved  Appreciate ID input. Continue meropenem for a total of 14 days until 01/01/2015 as per ID recommendations. 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 ESBL E.coli Urinary tract infection: Meropenem as above  - repeat UA and CXR  3. Healthcare acquired pneumonia: Remains on ventilator. - Continue meropenem as trach suction sputum growing Pseudomonas  - Citrobacter and MRSA-possible: Isolation. No fevers.  4. Acute respiratory failure: acute on chronic -Actually secondary to her pneumonia. Continue vent support. -Appreciate pulmonary consult. -Discharge to long-term acute care facility next week  -5. Atrial fibrillation: Bradycardic, so medications on hold. -Eliquis for anticoagulation  6. Delirium-patient was conversational on admission, currently nonverbal and very confused. -If no improvement, repeat CT head. No focal deficits noted. -CT on admission with no acute deficits.   7. Depression/anxiety: Lexapro and Seroquel. Anxiolytics as needed-added Ativan  8. Fungal skin infection: continue nystatin.  9. Nutrition- since Dobbhoff tube placed, we'll need to start tube feeds. Appreciate dietary consult. -On D5 until then.  Patient critically ill and high risk for cardiorespiratory arrest. Appreciate palliative care consult. Possible transfer to LTAC next week  CODE STATUS: Full code  TOTAL critical TIME  TAKING CARE OF THIS PATIENT: 35 minutes.   Enid Baas M.D on 01/02/2015 at 12:07 PM  Between 7am to 6pm - Pager - [phone]  After 6pm go to [email or web address] - password [email or web address] [facility]  [place] [facility]  Office  [phone]  CC: Primary care physician; Dorothey Baseman, MD

## 2015-01-02 NOTE — Progress Notes (Signed)
MD notified about red, frank blood in urethral catheter and apparent urine and blood that leaked around catheter. Patient appears calm, safety mittens still on, legs in roughly similar position as earlier. MD ordered 60 mL irrigation with sterile water to see if lightens or clears up and to watch and if doesn't lighten or clear up to call back for future orders.

## 2015-01-02 NOTE — Progress Notes (Signed)
Paged MD about patient's dobhoff, placed yesterday under fluoroscopy but not charted on to confirm distal tube length today. Current distal tube measurement of 85 cm at nare. With unknown distal tube length confirmation, tube feedings currently held. Awaiting MD callback.

## 2015-01-02 NOTE — Progress Notes (Signed)
[facility] [place] [facility] Medicine Progess Note     Name: NICKIA BOESEN MRN: [id number] DOB: 05-Nov-1945    ADMISSION DATE:  12/13/2014    CHIEF COMPLAINT: Dyspnea   STUDIES:  --   SUBJECTIVE:   Patient is sleepy but arousable on the ventilator.  Review of Systems:  Pt currently on the ventilator, can not provide history or review of systems.    VITAL SIGNS: Temp:  [97.4 F (36.3 C)-99.2 F (37.3 C)] 97.8 F (36.6 C) (09/24 1200) Pulse Rate:  [66-95] 83 (09/24 1200) Resp:  [14-23] 18 (09/24 1200) BP: (90-132)/(49-113) 100/61 mmHg (09/24 1200) SpO2:  [91 %-100 %] 93 % (09/24 1200) FiO2 (%):  [30 %-40 %] 40 % (09/24 1149) Weight:  [103.3 kg (227 lb 11.8 oz)] 103.3 kg (227 lb 11.8 oz) (09/24 0419) HEMODYNAMICS:   VENTILATOR SETTINGS: Vent Mode:  [-] PSV FiO2 (%):  [30 %-40 %] 40 % Set Rate:  [16 bmp] 16 bmp Vt Set:  [500 mL] 500 mL PEEP:  [5 cmH20] 5 cmH20 Pressure Support:  [8 cmH20] 8 cmH20 Plateau Pressure:  [21 cmH20] 21 cmH20 INTAKE / OUTPUT:  Intake/Output Summary (Last 24 hours) at 01/02/15 1211 Last data filed at 01/02/15 1158  Gross per 24 hour  Intake 2322.92 ml  Output    685 ml  Net 1637.92 ml    PHYSICAL EXAMINATION: Physical Examination:   VS: BP 100/61 mmHg  Pulse 83  Temp(Src) 97.8 F (36.6 C) (Axillary)  Resp 18  Ht  (1.702 m)  Wt 103.3 kg (227 lb 11.8 oz)  BMI 35.66 kg/m2  SpO2 93%  General Appearance: No distress  Neuro:without focal findings, lethargic but arousable. HEENT: PERRLA, EOM intact. Pulmonary: normal breath sounds   CardiovascularNormal S1,S2.  No m/r/g.   Abdomen: Benign, Soft, non-tender. Renal:  No costovertebral tenderness  GU:  Not performed at this time. Endocrine: No evident thyromegaly. Skin:   warm, no rashes, no ecchymosis  Extremities: normal, no cyanosis, clubbing.   LABS:   LABORATORY PANEL:   CBC  Recent Labs Lab 01/02/15 0335  WBC 7.5  HGB 9.2*  HCT 29.1*  PLT 127*     Chemistries   Recent Labs Lab 01/02/15 0335  NA 145  K 3.3*  CL 105  CO2 36*  GLUCOSE 99  BUN 19  CREATININE 0.93  CALCIUM 10.9*     Recent Labs Lab 01/01/15 1947 01/01/15 2358 01/02/15 01/02/15 0332 01/02/15 0731 01/02/15 1122  GLUCAP 74 61* 90 87 89 115*    Recent Labs Lab 12/27/14 2248 12/29/14 0419  PHART 7.35 7.54*  PCO2ART 57* 45  PO2ART 63* 80*   No results for input(s): AST, ALT, ALKPHOS, BILITOT, ALBUMIN in the last 168 hours.  Cardiac Enzymes  Recent Labs Lab 12/28/14 1208  TROPONINI 0.08*    RADIOLOGY:  Dg Chest 1 View  12/31/2014   CLINICAL DATA:  Patient with sepsis.  Followup pneumonia.  EXAM: CHEST 1 VIEW  COMPARISON:  12/29/2014 chest radiograph  FINDINGS: Patient is rotated to the left. Tracheostomy tube stable in position. Status post median sternotomy. Stable markedly enlarged cardiac and mediastinal contours. Re- demonstrated moderate left-greater-than-right pleural effusions with underlying pulmonary consolidation. No definite pneumothorax. Left upper extremity PICC line tip projects over the central left brachiocephalic vein. Multiple old rib fractures.  IMPRESSION: Stable marked cardiomegaly with persistent moderate bilateral pleural effusions and underlying pulmonary consolidation suggestive of atelectasis and edema. Infection not excluded.   Electronically Signed   By:  Annia Belt M.D.   On: 12/31/2014 13:18   Dg Abd Portable 1v  01/02/2015   CLINICAL DATA:  NG tube placement.  EXAM: PORTABLE ABDOMEN - 1 VIEW  COMPARISON:  01/01/2015 fluoroscopic images. 12/17/2014 abdominal radiograph.  FINDINGS: Weighted enteric tube appears to have advanced some in the interim with tip likely now over the second/third portion of the duodenum just right of midline. No dilated loops of bowel are seen.  As seen on recent chest radiographs, there is cardiomegaly with bilateral pleural effusions, incompletely evaluated.  IMPRESSION: Interval advancement of  feeding tube into the duodenum.   Electronically Signed   By: Sebastian Ache M.D.   On: 01/02/2015 09:35   Dg Basil Dess Tube Plc W/fl W/rad  01/01/2015   CLINICAL DATA:  Dobbhoff tube placement.  EXAM: NASO G TUBE PLACEMENT WITH FL AND WITH RAD  FLUOROSCOPY TIME:  Radiation Exposure Index (as provided by the fluoroscopic device): 7.5 mGy  COMPARISON:  None.  FINDINGS: Fluoroscopic guided Dobbhoff tube placement. Contact precautions were observed. The tip of the Dobhoff tube was lubricated. The tube was advanced into the body of the stomach without difficulty.  IMPRESSION: Successful placement of a Dobbhoff tube with the tip projecting over the body of the stomach.   Electronically Signed   By: Elige Ko   On: 01/01/2015 13:51    ASSESSMENT/PLAN   69 yo obese white female with acute on chronic resp failure, chronic trach tube, ESBL UTI, pseudomonas PNA vs tracheobronchitis, intermittent vent dependence   IMPRESSION: Chronic trach status Acute on chronic respiratory failure Intermittent vent dependence Mucus plugging Resolving pseudomonas PNA  pulm edema ESBL UTI Acute on chronic diastolic congestive heart failure. Coronary artery disease, S/P CABG x 4 S/P aortic valve replacement with bioprosthetic valve -Currently on apixaban   Profound baseline debilitation and acute deconditioning   PLAN: Cont vent support - settings reviewed and/or adjusted Cont weaning in PSV > ATC as tolerated Cont vent bundle Daily SBT if/when meets criteria Will likely need vent SNF in long term Cont current cardiac regimen and full anticoagulation Monitor BMET intermittently Monitor I/Os Correct electrolytes as indicated DVT px: SQ heparin Monitor CBC intermittently Transfuse per usual guidelines Monitor temp, WBC count Micro and abx as above Complete 14 days meropenem   Billy Fischer, MD PCCM service Mobile [phone] Pager [phone]

## 2015-01-03 ENCOUNTER — Inpatient Hospital Stay: Payer: Medicare Other

## 2015-01-03 DIAGNOSIS — N390 Urinary tract infection, site not specified: Secondary | ICD-10-CM

## 2015-01-03 LAB — BASIC METABOLIC PANEL
ANION GAP: 3 — AB (ref 5–15)
BUN: 26 mg/dL — ABNORMAL HIGH (ref 6–20)
CALCIUM: 10.9 mg/dL — AB (ref 8.9–10.3)
CO2: 37 mmol/L — AB (ref 22–32)
Chloride: 105 mmol/L (ref 101–111)
Creatinine, Ser: 0.92 mg/dL (ref 0.44–1.00)
Glucose, Bld: 123 mg/dL — ABNORMAL HIGH (ref 65–99)
POTASSIUM: 3.4 mmol/L — AB (ref 3.5–5.1)
Sodium: 145 mmol/L (ref 135–145)

## 2015-01-03 LAB — GLUCOSE, CAPILLARY
GLUCOSE-CAPILLARY: 114 mg/dL — AB (ref 65–99)
GLUCOSE-CAPILLARY: 109 mg/dL — AB (ref 65–99)
GLUCOSE-CAPILLARY: 132 mg/dL — AB (ref 65–99)
GLUCOSE-CAPILLARY: 100 mg/dL — AB (ref 65–99)
Glucose-Capillary: 114 mg/dL — ABNORMAL HIGH (ref 65–99)

## 2015-01-03 LAB — CBC
HEMATOCRIT: 28.9 % — AB (ref 35.0–47.0)
HEMOGLOBIN: 8.9 g/dL — AB (ref 12.0–16.0)
MCH: 26.4 pg (ref 26.0–34.0)
MCHC: 31 g/dL — ABNORMAL LOW (ref 32.0–36.0)
MCV: 85.4 fL (ref 80.0–100.0)
Platelets: 124 10*3/uL — ABNORMAL LOW (ref 150–440)
RBC: 3.38 MIL/uL — AB (ref 3.80–5.20)
RDW: 20.4 % — ABNORMAL HIGH (ref 11.5–14.5)
WBC: 5.5 10*3/uL (ref 3.6–11.0)

## 2015-01-03 MED ORDER — POTASSIUM CHLORIDE 20 MEQ/15ML (10%) PO SOLN
20.0000 meq | Freq: Once | ORAL | Status: AC
  Administered 2015-01-03: 20 meq via ORAL
  Filled 2015-01-03: qty 15

## 2015-01-03 MED ORDER — POTASSIUM CHLORIDE CRYS ER 20 MEQ PO TBCR: 20.0000 meq | EXTENDED_RELEASE_TABLET | Freq: Once | ORAL | Status: DC

## 2015-01-03 MED ORDER — CHLORHEXIDINE GLUCONATE 0.12 % MT SOLN
15.0000 mL | Freq: Two times a day (BID) | OROMUCOSAL | Status: DC
  Administered 2015-01-03 – 2015-01-04 (×3): 15 mL via OROMUCOSAL
  Filled 2015-01-03: qty 15

## 2015-01-03 MED ORDER — CETYLPYRIDINIUM CHLORIDE 0.05 % MT LIQD
7.0000 mL | Freq: Two times a day (BID) | OROMUCOSAL | Status: DC
  Administered 2015-01-03 – 2015-01-04 (×2): 7 mL via OROMUCOSAL

## 2015-01-03 NOTE — Progress Notes (Signed)
[facility] [place] [facility] Medicine Progess Note     Name: Rachel Brady MRN: [id number] DOB: 1945-08-20    ADMISSION DATE:  12/13/2014    CHIEF COMPLAINT: Dyspnea     No new complaints No distress.  Tolerating ATC   VITAL SIGNS: Temp:  [96.7 F (35.9 C)-99.7 F (37.6 C)] 99 F (37.2 C) (09/25 1104) Pulse Rate:  [71-112] 88 (09/25 1104) Resp:  [12-25] 23 (09/25 1104) BP: (67-120)/(29-87) 117/63 mmHg (09/25 1058) SpO2:  [90 %-100 %] 98 % (09/25 1104) FiO2 (%):  [40 %-50 %] 50 % (09/25 1104) Weight:  [104.9 kg (231 lb 4.2 oz)] 104.9 kg (231 lb 4.2 oz) (09/25 0430) HEMODYNAMICS:   VENTILATOR SETTINGS: Vent Mode:  [-] PSV FiO2 (%):  [40 %-50 %] 50 % PEEP:  [5 cmH20] 5 cmH20 Pressure Support:  [8 cmH20] 8 cmH20 INTAKE / OUTPUT:  Intake/Output Summary (Last 24 hours) at 01/03/15 1144 Last data filed at 01/03/15 1100  Gross per 24 hour  Intake   2313 ml  Output    395 ml  Net   1918 ml    PHYSICAL EXAMINATION:  VS: BP 117/63 mmHg  Pulse 88  Temp(Src) 99 F (37.2 C) (Axillary)  Resp 23  Ht  (1.702 m)  Wt 104.9 kg (231 lb 4.2 oz)  BMI 36.21 kg/m2  SpO2 98%  General Appearance: No distress  Neuro:without focal findings, lethargic but arousable. HEENT: PERRLA, EOM intact. Pulmonary: normal breath sounds   CardiovascularNormal S1,S2.  No m/r/g.   Abdomen: Benign, Soft, non-tender. Renal:  No costovertebral tenderness  GU:  Not performed at this time. Endocrine: No evident thyromegaly. Skin:   warm, no rashes, no ecchymosis  Extremities: normal, no cyanosis, clubbing.   LABS:   LABORATORY PANEL:   CBC  Recent Labs Lab 01/03/15 0426  WBC 5.5  HGB 8.9*  HCT 28.9*  PLT 124*    Chemistries   Recent Labs Lab 01/03/15 0426  NA 145  K 3.4*  CL 105  CO2 37*  GLUCOSE 123*  BUN 26*  CREATININE 0.92  CALCIUM 10.9*     Recent Labs Lab 01/02/15 1122 01/02/15 1612 01/02/15 2002 01/03/15 0151 01/03/15 0715 01/03/15 1101    GLUCAP 115* 134* 107* 114* 109* 114*    Recent Labs Lab 12/27/14 2248 12/29/14 0419  PHART 7.35 7.54*  PCO2ART 57* 45  PO2ART 63* 80*   No results for input(s): AST, ALT, ALKPHOS, BILITOT, ALBUMIN in the last 168 hours.  Cardiac Enzymes  Recent Labs Lab 12/28/14 1208  TROPONINI 0.08*    RADIOLOGY:  Dg Chest Port 1 View  01/03/2015   CLINICAL DATA:  Respiratory failure  EXAM: PORTABLE CHEST 1 VIEW  COMPARISON:  12/31/2014  FINDINGS: Tracheostomy tube and left PICC line remain in place, unchanged. There is cardiomegaly with vascular congestion and bilateral airspace opacities, likely edema/ CHF. Small to moderate bilateral effusions. No significant change.  IMPRESSION: Stable CHF pattern.  Small to moderate bilateral effusions.   Electronically Signed   By: Charlett Nose M.D.   On: 01/03/2015 07:59   Dg Abd Portable 1v  01/02/2015   CLINICAL DATA:  NG tube placement.  EXAM: PORTABLE ABDOMEN - 1 VIEW  COMPARISON:  01/01/2015 fluoroscopic images. 12/17/2014 abdominal radiograph.  FINDINGS: Weighted enteric tube appears to have advanced some in the interim with tip likely now over the second/third portion of the duodenum just right of midline. No dilated loops of bowel are seen.  As seen on  recent chest radiographs, there is cardiomegaly with bilateral pleural effusions, incompletely evaluated.  IMPRESSION: Interval advancement of feeding tube into the duodenum.   Electronically Signed   By: Sebastian Ache M.D.   On: 01/02/2015 09:35   Dg Basil Dess Tube Plc W/fl W/rad  01/01/2015   CLINICAL DATA:  Dobbhoff tube placement.  EXAM: NASO G TUBE PLACEMENT WITH FL AND WITH RAD  FLUOROSCOPY TIME:  Radiation Exposure Index (as provided by the fluoroscopic device): 7.5 mGy  COMPARISON:  None.  FINDINGS: Fluoroscopic guided Dobbhoff tube placement. Contact precautions were observed. The tip of the Dobhoff tube was lubricated. The tube was advanced into the body of the stomach without difficulty.   IMPRESSION: Successful placement of a Dobbhoff tube with the tip projecting over the body of the stomach.   Electronically Signed   By: Elige Ko   On: 01/01/2015 13:51    ASSESSMENT/PLAN   69 yo obese white female with acute on chronic resp failure, chronic trach tube, ESBL UTI, pseudomonas PNA vs tracheobronchitis, intermittent vent dependence   IMPRESSION: Chronic trach status Acute on chronic respiratory failure Intermittent vent dependence Recurrent mucus plugging Resolving pseudomonas PNA    Cardiomyopathy Refractory pulm edema ESBL UTI Acute on chronic diastolic congestive heart failure. Coronary artery disease, S/P CABG x 4 S/P aortic valve replacement with bioprosthetic valve -Currently on apixaban   Profound baseline debilitation and acute deconditioning   PLAN: Cont vent support - settings reviewed and/or adjusted Cont weaning in PSV > ATC as tolerated Cont vent bundle Daily SBT if/when meets criteria Will likely need vent SNF in long term Cont current cardiac regimen and full anticoagulation Monitor BMET intermittently Monitor I/Os Correct electrolytes as indicated DVT px: SQ heparin Monitor CBC intermittently Transfuse per usual guidelines Monitor temp, WBC count Micro and abx reviewed Completed 14 days meropenem   Billy Fischer, MD PCCM service Mobile [phone] Pager [phone]

## 2015-01-03 NOTE — Progress Notes (Signed)
Electrolyte replacement consult  9/25 AM BMP K+ 3.4. KCl 20 mEq x1 ordered. Recheck BMP in AM.  Fulton Reek, PharmD, BCPS  01/03/2015

## 2015-01-03 NOTE — Progress Notes (Signed)
[facility]   PATIENT NAME: Rachel Brady    MR#:  [id number]  DATE OF BIRTH:  November 23, 1945  SUBJECTIVE:  - more alert but confused today - urine turned darker- bloody since yesterday, improved now- patient on eliquis - required vent overnight and on trach collar this am  REVIEW OF SYSTEMS:  Review of Systems  Unable to perform ROS due to critical illness  DRUG ALLERGIES:   Allergies  Allergen Reactions  . Codeine     Has tolerated oxycodone in the past.   VITALS:  Blood pressure 111/78, pulse 90, temperature 99 F (37.2 C), temperature source Axillary, resp. rate 18, height  (1.702 m), weight 104.9 kg (231 lb 4.2 oz), SpO2 95 %. PHYSICAL EXAMINATION:  Physical Exam  Constitutional: critically ill ,patient is  On trach collar today Lethargic, sedated this am Head: Normocephalic and atraumatic.  Trach in place ,  Dobhoff tube in place Eyes: Conjunctivae are normal. Pupils are equal, round, and reactive to light.  Neck: Normal range of motion. Neck supple. No tracheal deviation present. No thyromegaly present.  trach collar is connected to vent. Cardiovascular: Normal rate, regular rhythm and normal heart sounds.   Pulmonary/Chest: Bilateral breath sounds present She has no wheezes. Coarse breath sounds at the bases, scant breath sounds overall Abdominal: Soft. Bowel sounds are normal. She exhibits no distension. There is no tenderness.  Musculoskeletal: Normal range of motion.  Neurological: No cranial nerve deficit.  Sedated,  Not following commands. Skin: Skin is warm and dry. No rash noted.  Psychiatric: Very confused, alert. Not verbal.   LABORATORY PANEL:   CBC  Recent Labs Lab 01/03/15 0426  WBC 5.5  HGB 8.9*  HCT 28.9*  PLT 124*   ------------------------------------------------------------------------------------------------------------------ Chemistries   Recent Labs Lab 01/03/15 0426  NA 145  K  3.4*  CL 105  CO2 37*  GLUCOSE 123*  BUN 26*  CREATININE 0.92  CALCIUM 10.9*   RADIOLOGY:  Dg Chest Port 1 View  01/03/2015   CLINICAL DATA:  Respiratory failure  EXAM: PORTABLE CHEST 1 VIEW  COMPARISON:  12/31/2014  FINDINGS: Tracheostomy tube and left PICC line remain in place, unchanged. There is cardiomegaly with vascular congestion and bilateral airspace opacities, likely edema/ CHF. Small to moderate bilateral effusions. No significant change.  IMPRESSION: Stable CHF pattern.  Small to moderate bilateral effusions.   Electronically Signed   By: Charlett Nose M.D.   On: 01/03/2015 07:59   ASSESSMENT AND PLAN:   69 year old female with a history of tracheostomy, atrial fibrillation, diabetes who presents with sepsis.  1. Sepsis with hypotension: secondary to urinary tract infection with ESBL ecoli Hypotension resolved  Appreciate ID input. - finished meropenem - received for 14 days as per ID recommendations. 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 ESBL E.coli Urinary tract infection: Meropenem as above  - repeat UA with no bacteria  and CXR with stable bilateral pleural effusions, and no new infiltrates.  3. Hematuria-likely from irritation. No active bleeding at this time. Improving with flushing the catheter. -Continue to flush as needed. -Urine output slightly decreased today. Monitor for now, if needed we'll start IV fluids. -Patient on oral Lasix. No change in renal function at this time. -If bleeding worsens, we'll need to hold her eliquis  4. Acute respiratory failure: acute on chronic -Actually secondary to her pneumonia. Continue vent  support. -On trach collar this morning. -Appreciate pulmonary consult. finished ABX meropenem -Discharge to long-term acute care facility next week  -5. Atrial fibrillation: Bradycardic, so medications on hold. -Eliquis for  anticoagulation  6. Delirium-patient was conversational on admission, currently nonverbal and very confused. -No focal deficits noted. -CT on admission with no acute deficits.   7. Depression/anxiety: Lexapro and Seroquel. Anxiolytics as needed-added Ativan  8. Fungal skin infection: continue nystatin.  9. Nutrition- since Dobbhoff tube placed, on tube feeds. Appreciate dietary consult..  Patient critically ill and high risk for cardiorespiratory arrest. Appreciate palliative care consult. Possible transfer to LTAC next week  CODE STATUS: Full code  TOTAL critical TIME  TAKING CARE OF THIS PATIENT: 35 minutes.   KALISETTI,RADHIKA M.D on 01/03/2015 at 1:10 PM  Between 7am to 6pm - Pager - [phone]  After 6pm go to [email or web address] - password [email or web address] [facility]  [place] [facility]  Office  [phone]  CC: Primary care physician; Dorothey Baseman, MD

## 2015-01-03 NOTE — Progress Notes (Signed)
Pt has rested well throughout shift. Red blood in catheter currently, will notify MD.

## 2015-01-04 ENCOUNTER — Ambulatory Visit (HOSPITAL_COMMUNITY)
Admission: AD | Admit: 2015-01-04 | Discharge: 2015-01-04 | Disposition: A | Discharge status: Home / Self Care | Payer: Medicare Other | Source: Other Acute Inpatient Hospital | Attending: Internal Medicine | Admitting: Internal Medicine

## 2015-01-04 DIAGNOSIS — J189 Pneumonia, unspecified organism: Secondary | ICD-10-CM

## 2015-01-04 DIAGNOSIS — Z9911 Dependence on respirator [ventilator] status: Secondary | ICD-10-CM | POA: Insufficient documentation

## 2015-01-04 LAB — BASIC METABOLIC PANEL
Anion gap: 3 — ABNORMAL LOW (ref 5–15)
BUN: 29 mg/dL — AB (ref 6–20)
CALCIUM: 10.7 mg/dL — AB (ref 8.9–10.3)
CO2: 37 mmol/L — ABNORMAL HIGH (ref 22–32)
CREATININE: 0.92 mg/dL (ref 0.44–1.00)
Chloride: 103 mmol/L (ref 101–111)
GFR calc non Af Amer: >60 mL/min (ref >60)
Glucose, Bld: 122 mg/dL — ABNORMAL HIGH (ref 65–99)
Potassium: 3.6 mmol/L (ref 3.5–5.1)
SODIUM: 143 mmol/L (ref 135–145)

## 2015-01-04 LAB — GLUCOSE, CAPILLARY
GLUCOSE-CAPILLARY: 98 mg/dL (ref 65–99)
GLUCOSE-CAPILLARY: 116 mg/dL — AB (ref 65–99)
GLUCOSE-CAPILLARY: 119 mg/dL — AB (ref 65–99)
Glucose-Capillary: 103 mg/dL — ABNORMAL HIGH (ref 65–99)
Glucose-Capillary: 104 mg/dL — ABNORMAL HIGH (ref 65–99)
Glucose-Capillary: 116 mg/dL — ABNORMAL HIGH (ref 65–99)
Glucose-Capillary: 177 mg/dL — ABNORMAL HIGH (ref 65–99)

## 2015-01-04 MED ORDER — SENNOSIDES-DOCUSATE SODIUM 8.6-50 MG PO TABS: 1.0000 | ORAL_TABLET | Freq: Two times a day (BID) | ORAL | Status: AC

## 2015-01-04 MED ORDER — VITAL HIGH PROTEIN PO LIQD: 1000.0000 mL | ORAL | Status: AC

## 2015-01-04 MED ORDER — LORAZEPAM 1 MG PO TABS: 1.0000 mg | ORAL_TABLET | Freq: Three times a day (TID) | ORAL | Status: AC | PRN

## 2015-01-04 MED ORDER — NYSTATIN 100000 UNIT/GM EX OINT
TOPICAL_OINTMENT | Freq: Two times a day (BID) | CUTANEOUS | Status: DC
  Administered 2015-01-04: 10:00:00 via TOPICAL
  Filled 2015-01-04: qty 15

## 2015-01-04 MED ORDER — FENTANYL 50 MCG/HR TD PT72: 50.0000 ug | MEDICATED_PATCH | TRANSDERMAL | Status: AC

## 2015-01-04 NOTE — Consult Note (Signed)
GI Inpatient Consult Note  Reason for Consult: PEG placement   Attending Requesting Consult: Dr/ Nicholos Johns  History of Present Illness: Rachel Brady is a 69 y.o. female with a significant history of tracheostomy (2013), type II diabetes, aortic valve stenosis severe in nature status post repair and replacement with chronic a fib, severe sleep apnea, being treated for UTI and sepsis.  She went into cardiac arrest on her way to the hospital and had an elevated troponin of 0.08, which according to Dr. Gwen Pounds was most consistent with the treatment of the cardiac arrest and not consistent with an acute MI.  Patient was very groggy and did not answer questions when I interviewed her.  She did shake her head no when asked if she has had any surgeries on her stomach.  She does not have any visible scarring on her abdomen, with the exception on a small possible scar on the left mid area.  She does have a significant weight loss of 51 pounds, her recorded weight on 08/13/2104 was 281, on 9.21.2016 was 230 pounds.  Dr. Mechele Collin did consult on patient in 04/2011.  Note at that time showed the Dr. Katrinka Blazing had placed a PEG using the T wire system.  EGD showed mild gastritis only, esophagus and duodenum neg.  NG tube removed after successful PEG placement.    Past Medical History:  Past Medical History  Diagnosis Date  . Tracheostomy in place   . Diabetes   . CKD (chronic kidney disease)   . OSA (obstructive sleep apnea)   . HTN (hypertension)   . MDD (major depressive disorder)     Problem List: Patient Active Problem List   Diagnosis Date Noted  . HCAP (healthcare-associated pneumonia)   . Respiratory distress 12/15/2014  . Pressure ulcer 12/15/2014  . Sepsis 12/13/2014    Past Surgical History: History reviewed. No pertinent past surgical history.  Allergies: Allergies  Allergen Reactions  . Codeine     Has tolerated oxycodone in the past.    Home Medications: Prescriptions prior to  admission  Medication Sig Dispense Refill Last Dose  . acetaminophen (TYLENOL) 325 MG tablet Take 650 mg by mouth every 6 (six) hours as needed for mild pain, moderate pain or fever.   unknown  . ALPRAZolam (XANAX) 0.5 MG tablet Take 0.5 mg by mouth every 8 (eight) hours as needed for anxiety.   unknown  . apixaban (ELIQUIS) 5 MG TABS tablet Take 5 mg by mouth 2 (two) times daily.   unknown  . atorvastatin (LIPITOR) 80 MG tablet Take 80 mg by mouth at bedtime.   unknown  . bisacodyl (DULCOLAX) 10 MG suppository Place 10 mg rectally every other day.   unknown  . carvedilol (COREG) 6.25 MG tablet Take 6.25 mg by mouth 2 (two) times daily. Hold if SBP <105 or HR <60.   unknown  . escitalopram (LEXAPRO) 5 MG tablet Take 5 mg by mouth daily.   unknown  . famotidine (PEPCID) 40 MG tablet Take 40 mg by mouth daily.   unknown  . furosemide (LASIX) 40 MG tablet Take 40 mg by mouth daily.   unknown  . insulin aspart (NOVOLOG FLEXPEN) 100 UNIT/ML FlexPen Inject into the skin 4 (four) times daily -  before meals and at bedtime. Sliding scale: if blood sugar is 151-200=4 units, 201-250=8 units, 251-300=10 units, 301-350=12 units, greater than 351=Call MD   unknown  . Insulin Detemir (LEVEMIR FLEXTOUCH) 100 UNIT/ML Pen Inject 10 Units into the skin  every morning.   unknown  . ipratropium-albuterol (DUONEB) 0.5-2.5 (3) MG/3ML SOLN Take 3 mLs by nebulization See admin instructions. Use 1 vial via nebulizer every 6 hours (scheduled). Use 1 vial via nebulizer every 2 hours as needed for shortness of breath and/or wheezing.   unknown  . isosorbide dinitrate (ISORDIL) 30 MG tablet Take 30 mg by mouth daily.   unknown  . megestrol (MEGACE) 400 MG/10ML suspension Take 400 mg by mouth daily.   unknown  . Multiple Vitamin (MULTIVITAMIN) tablet Take 1 tablet by mouth daily.   unknown  . nystatin cream (MYCOSTATIN) Apply 1 application topically 2 (two) times daily.   unknown  . ondansetron (ZOFRAN) 4 MG tablet Take 4 mg by  mouth every 6 (six) hours as needed for nausea or vomiting.   unknown  . oxyCODONE (OXY IR/ROXICODONE) 5 MG immediate release tablet Take 5 mg by mouth every 6 (six) hours as needed for severe pain.   unknown  . POLYETHYLENE GLYCOL 3350 PO Take 17 g by mouth daily. Mix with 4-8 ounces of water.   unknown  . potassium chloride SA (K-DUR,KLOR-CON) 20 MEQ tablet Take 40 mEq by mouth daily.   unknown  . QUEtiapine (SEROQUEL) 50 MG tablet Take 50 mg by mouth at bedtime.   unknown   Home medication reconciliation was completed with the patient.   Scheduled Inpatient Medications:   . antiseptic oral rinse  7 mL Mouth Rinse q12n4p  . apixaban  5 mg Oral BID  . chlorhexidine  15 mL Mouth Rinse BID  . escitalopram  5 mg Oral Daily  . famotidine  40 mg Oral Daily  . feeding supplement (VITAL HIGH PROTEIN)  1,000 mL Per Tube Q24H  . fentaNYL  50 mcg Transdermal Q72H  . free water  200 mL Per Tube Q4H  . furosemide  40 mg Oral Daily  . insulin aspart  0-9 Units Subcutaneous 6 times per day  . ipratropium-albuterol  3 mL Nebulization Q6H  . isosorbide dinitrate  30 mg Oral Daily  . nystatin ointment   Topical BID  . QUEtiapine  50 mg Per Tube QHS  . senna-docusate  1 tablet Oral BID  . sodium chloride  10-40 mL Intracatheter Q12H    Continuous Inpatient Infusions:   . sodium chloride Stopped (01/01/15 0131)    PRN Inpatient Medications:  acetaminophen **OR** acetaminophen, hydrALAZINE, Influenza vac split quadrivalent PF, LORazepam, ondansetron **OR** ondansetron (ZOFRAN) IV, sodium chloride  Family History: family history is not on file.    Social History:   reports that she has never smoked. She does not have any smokeless tobacco history on file.   Review of Systems: Unable to evaluate due to patient's condition   Physical Examination: BP 103/61 mmHg  Pulse 79  Temp(Src) 99.9 F (37.7 C) (Axillary)  Resp 17  Ht  (1.702 m)  Wt 105 kg (231 lb 7.7 oz)  BMI 36.25 kg/m2   SpO2 100% Gen: patient is groggy, non responsive to most questions HEENT: PEERLA, EOMI, Neck: supple, no JVD or thyromegaly Chest: CTA bilaterally, no wheezes, crackles, or other adventitious sounds CV: RRR, no m/g/c/r Abd: soft, NT, ND, +BS in all four quadrants; no HSM, guarding, ridigity, or rebound tenderness Ext: no edema, well perfused with 2+ pulses, Skin: no rash or lesions noted Lymph: no LAD  Data: Lab Results  Component Value Date   WBC 5.5 01/03/2015   HGB 8.9* 01/03/2015   HCT 28.9* 01/03/2015   MCV 85.4  01/03/2015   PLT 124* 01/03/2015    Recent Labs Lab 01/01/15 0444 01/02/15 0335 01/03/15 0426  HGB 8.7* 9.2* 8.9*   Lab Results  Component Value Date   NA 143 01/04/2015   K 3.6 01/04/2015   CL 103 01/04/2015   CO2 37* 01/04/2015   BUN 29* 01/04/2015   CREATININE 0.92 01/04/2015   Lab Results  Component Value Date   ALT 87* 12/13/2014   AST 32 12/13/2014   ALKPHOS 128* 12/13/2014   BILITOT 0.5 12/13/2014   No results for input(s): APTT, INR, PTT in the last 168 hours.   Imaging:  CLINICAL DATA: NG tube placement.  EXAM: PORTABLE ABDOMEN - 1 VIEW  COMPARISON: 01/01/2015 fluoroscopic images. 12/17/2014 abdominal radiograph.  FINDINGS: Weighted enteric tube appears to have advanced some in the interim with tip likely now over the second/third portion of the duodenum just right of midline. No dilated loops of bowel are seen.  As seen on recent chest radiographs, there is cardiomegaly with bilateral pleural effusions, incompletely evaluated.  IMPRESSION: Interval advancement of feeding tube into the duodenum.   Electronically Signed  By: Sebastian Ache M.D.  On: 01/02/2015 09:35  Assessment/Plan: Ms. Stigall is a 69 y.o. female with previous PEG tube placement in 04/2011 with Dr. Mechele Collin and Dr. Katrinka Blazing.    Recommendations: Patient has been discussed with Dr. Mechele Collin, please see his note for further recommendations.   Thank you  for the consult. Please call with questions or concerns.  Carney Harder, PA-C  I personally performed these services.

## 2015-01-04 NOTE — Progress Notes (Signed)
[facility] Medicine Progess Note     Name: MARKAN CAZAREZ MRN: [id number] DOB: 11-27-1945    ADMISSION DATE:  12/13/2014    CHIEF COMPLAINT: Dyspnea   STUDIES:  --   SUBJECTIVE:   Patient is sleepy but arousable on the ventilator.  Review of Systems:  Pt currently on the ventilator, can not provide history or review of systems.    VITAL SIGNS: Temp:  [98.8 F (37.1 C)-100 F (37.8 C)] 99.9 F (37.7 C) (09/26 0600) Pulse Rate:  [77-95] 79 (09/26 0600) Resp:  [15-27] 17 (09/26 0600) BP: (93-129)/(60-78) 103/61 mmHg (09/26 0600) SpO2:  [84 %-100 %] 100 % (09/26 0600) FiO2 (%):  [30 %-50 %] 40 % (09/26 0700) Weight:  [105 kg (231 lb 7.7 oz)] 105 kg (231 lb 7.7 oz) (09/26 0400) HEMODYNAMICS:   VENTILATOR SETTINGS: Vent Mode:  [-] PRVC FiO2 (%):  [30 %-50 %] 40 % Set Rate:  [16 bmp] 16 bmp Vt Set:  [500 mL] 500 mL PEEP:  [5 cmH20] 5 cmH20 Pressure Support:  [8 cmH20] 8 cmH20 INTAKE / OUTPUT:  Intake/Output Summary (Last 24 hours) at 01/04/15 0938 Last data filed at 01/04/15 0700  Gross per 24 hour  Intake   2405 ml  Output    570 ml  Net   1835 ml    PHYSICAL EXAMINATION: Physical Examination:   VS: BP 103/61 mmHg  Pulse 79  Temp(Src) 99.9 F (37.7 C) (Axillary)  Resp 17  Ht  (1.702 m)  Wt 105 kg (231 lb 7.7 oz)  BMI 36.25 kg/m2  SpO2 100%  General Appearance: No distress  Neuro:without focal findings, lethargic but arousable. HEENT: PERRLA, EOM intact. Pulmonary: normal breath sounds   CardiovascularNormal S1,S2.  No m/r/g.   Abdomen: Benign, Soft, non-tender. Renal:  No costovertebral tenderness  GU:  Not performed at this time. Endocrine: No evident thyromegaly. Skin:   warm, no rashes, no ecchymosis  Extremities: normal, no cyanosis, clubbing.   LABS:   LABORATORY PANEL:   CBC  Recent Labs Lab 01/03/15 0426  WBC 5.5  HGB 8.9*  HCT 28.9*  PLT 124*    Chemistries   Recent Labs Lab 01/04/15 0345    NA 143  K 3.6  CL 103  CO2 37*  GLUCOSE 122*  BUN 29*  CREATININE 0.92  CALCIUM 10.7*     Recent Labs Lab 01/03/15 1101 01/03/15 1612 01/03/15 2007 01/04/15 0005 01/04/15 0400 01/04/15 0733  GLUCAP 114* 132* 100* 103* 104* 98    Recent Labs Lab 12/29/14 0419  PHART 7.54*  PCO2ART 45  PO2ART 80*   No results for input(s): AST, ALT, ALKPHOS, BILITOT, ALBUMIN in the last 168 hours.  Cardiac Enzymes  Recent Labs Lab 12/28/14 1208  TROPONINI 0.08*    RADIOLOGY:  Dg Chest Port 1 View  01/03/2015   CLINICAL DATA:  Respiratory failure  EXAM: PORTABLE CHEST 1 VIEW  COMPARISON:  12/31/2014  FINDINGS: Tracheostomy tube and left PICC line remain in place, unchanged. There is cardiomegaly with vascular congestion and bilateral airspace opacities, likely edema/ CHF. Small to moderate bilateral effusions. No significant change.  IMPRESSION: Stable CHF pattern.  Small to moderate bilateral effusions.   Electronically Signed   By: Charlett Nose M.D.   On: 01/03/2015 07:59    ASSESSMENT/PLAN   69 yo obese white female with acute on chronic resp failure, chronic trach tube, ESBL UTI, pseudomonas PNA vs tracheobronchitis, intermittent vent dependence   IMPRESSION: Chronic trach  status Acute on chronic respiratory failure Intermittent vent dependence Mucus plugging Resolving pseudomonas PNA  pulm edema ESBL UTI Acute on chronic diastolic congestive heart failure. Coronary artery disease, S/P CABG x 4 S/P aortic valve replacement with bioprosthetic valve -Currently on apixaban   Profound baseline debilitation and acute deconditioning   PLAN: Cont vent support - settings reviewed and/or adjusted Cont weaning in PSV > ATC as tolerated Cont vent bundle Daily SBT if/when meets criteria Will likely need vent SNF in long term Cont current cardiac regimen and full anticoagulation Monitor BMET intermittently Monitor I/Os Correct electrolytes as indicated DVT px: SQ  heparin Monitor CBC intermittently Transfuse per usual guidelines Monitor temp, WBC count Micro and abx as above Complete 14 days meropenem   Deep Nicholos Johns, M.D. PCCM service

## 2015-01-04 NOTE — Discharge Summary (Signed)
[facility] [place] [facility]   PATIENT NAME: Rachel Brady    MR#:  [id number]  DATE OF BIRTH:  17-Jul-1945  DATE OF ADMISSION:  12/13/2014 ADMITTING PHYSICIAN: Adrian Saran, MD  DATE OF DISCHARGE: 01/04/2015  PRIMARY CARE PHYSICIAN: Dorothey Baseman, MD    ADMISSION DIAGNOSIS:  UTI (lower urinary tract infection) [N39.0] HCAP (healthcare-associated pneumonia) [J18.9] Sepsis, due to unspecified organism [A41.9] Altered mental status, unspecified altered mental status type [R41.82]  DISCHARGE DIAGNOSIS:  Active Problems:   Sepsis   Respiratory distress   Pressure ulcer   HCAP (healthcare-associated pneumonia)   SECONDARY DIAGNOSIS:   Past Medical History  Diagnosis Date  . Tracheostomy in place   . Diabetes   . CKD (chronic kidney disease)   . OSA (obstructive sleep apnea)   . HTN (hypertension)   . MDD (major depressive disorder)     HOSPITAL COURSE:   69 year old female with a history of tracheostomy, atrial fibrillation, diabetes who presents with sepsis.  1. Sepsis with hypotension: secondary to urinary tract infection with ESBL ecoli Hypotension resolved Appreciate ID input. - finished meropenem - received for 14 days as per ID recommendations. 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 ESBL E.coli Urinary tract infection: Meropenem as above  - repeat UA with no bacteria and CXR with stable bilateral pleural effusions, and no new infiltrates.  3. Hematuria-likely from irritation. No active bleeding at this time. Improving with flushing the catheter. -can discontinue foley.   4. Acute respiratory failure: acute on chronic -Actually secondary to her pneumonia. Continue vent support. -On trach collar sometimes, but back to vent and PS today. -Appreciate pulmonary consult. finished ABX meropenem -Discharge to SNF facility  today  -5. Atrial fibrillation: Bradycardic, so medications on hold. -Eliquis for anticoagulation  6. Delirium-patient was conversational on admission, currently nonverbal and very confused. -No focal deficits noted. -CT on admission with no acute deficits.  7. Depression/anxiety: Lexapro and Seroquel. Anxiolytics as needed-added Ativan  8. Fungal skin infection: continue nystatin.  9. Nutrition- since Dobbhoff tube placed, continue on tube feeds. Appreciate dietary consult..  Patient is as stable as she can get and can be transferred to a SNF where her vent can be managed.  DISCHARGE CONDITIONS:  Critical  CONSULTS OBTAINED:  Treatment Team:  Erin Fulling, MD Clydie Braun, MD Lamar Blinks, MD Scot Jun, MD  DRUG ALLERGIES:   Allergies  Allergen Reactions  . Codeine     Has tolerated oxycodone in the past.    DISCHARGE MEDICATIONS:   Current Discharge Medication List    START taking these medications   Details  fentaNYL (DURAGESIC - DOSED MCG/HR) 50 MCG/HR Place 1 patch (50 mcg total) onto the skin every 3 (three) days. Qty: 5 patch, Refills: 0    LORazepam (ATIVAN) 1 MG tablet Take 1 tablet (1 mg total) by mouth every 8 (eight) hours as needed for anxiety. Qty: 20 tablet, Refills: 0    Nutritional Supplements (FEEDING SUPPLEMENT, VITAL HIGH PROTEIN,) LIQD liquid Place 1,000 mLs into feeding tube daily. Qty: 10000 mL, Refills: 2    senna-docusate (SENOKOT-S) 8.6-50 MG per tablet Take 1 tablet by mouth 2 (two) times daily. Qty: 30 tablet, Refills: 0      CONTINUE these medications which have NOT CHANGED   Details  acetaminophen (TYLENOL) 325 MG tablet Take 650 mg by mouth every 6 (six) hours as  needed for mild pain, moderate pain or fever.    ALPRAZolam (XANAX) 0.5 MG tablet Take 0.5 mg by mouth every 8 (eight) hours as needed for anxiety.    apixaban (ELIQUIS) 5 MG TABS tablet Take 5 mg by mouth 2 (two) times daily.    atorvastatin  (LIPITOR) 80 MG tablet Take 80 mg by mouth at bedtime.    escitalopram (LEXAPRO) 5 MG tablet Take 5 mg by mouth daily.    famotidine (PEPCID) 40 MG tablet Take 40 mg by mouth daily.    furosemide (LASIX) 40 MG tablet Take 40 mg by mouth daily.    ipratropium-albuterol (DUONEB) 0.5-2.5 (3) MG/3ML SOLN Take 3 mLs by nebulization See admin instructions. Use 1 vial via nebulizer every 6 hours (scheduled). Use 1 vial via nebulizer every 2 hours as needed for shortness of breath and/or wheezing.    isosorbide dinitrate (ISORDIL) 30 MG tablet Take 30 mg by mouth daily.    Multiple Vitamin (MULTIVITAMIN) tablet Take 1 tablet by mouth daily.    nystatin cream (MYCOSTATIN) Apply 1 application topically 2 (two) times daily.    QUEtiapine (SEROQUEL) 50 MG tablet Take 50 mg by mouth at bedtime.      STOP taking these medications     bisacodyl (DULCOLAX) 10 MG suppository      carvedilol (COREG) 6.25 MG tablet      insulin aspart (NOVOLOG FLEXPEN) 100 UNIT/ML FlexPen      Insulin Detemir (LEVEMIR FLEXTOUCH) 100 UNIT/ML Pen      megestrol (MEGACE) 400 MG/10ML suspension      ondansetron (ZOFRAN) 4 MG tablet      oxyCODONE (OXY IR/ROXICODONE) 5 MG immediate release tablet      POLYETHYLENE GLYCOL 3350 PO      potassium chloride SA (K-DUR,KLOR-CON) 20 MEQ tablet          DISCHARGE INSTRUCTIONS:   1. Pulmonary follow-up over at the nursing facility for vent management. 2. PCP follow-up in less than a week  If you experience worsening of your admission symptoms, develop shortness of breath, life threatening emergency, suicidal or homicidal thoughts you must seek medical attention immediately by calling 911 or calling your MD immediately  if symptoms less severe.  You Must read complete instructions/literature along with all the possible adverse reactions/side effects for all the Medicines you take and that have been prescribed to you. Take any new Medicines after you have completely  understood and accept all the possible adverse reactions/side effects.   Please note  You were cared for by a hospitalist during your hospital stay. If you have any questions about your discharge medications or the care you received while you were in the hospital after you are discharged, you can call the unit and asked to speak with the hospitalist on call if the hospitalist that took care of you is not available. Once you are discharged, your primary care physician will handle any further medical issues. Please note that NO REFILLS for any discharge medications will be authorized once you are discharged, as it is imperative that you return to your primary care physician (or establish a relationship with a primary care physician if you do not have one) for your aftercare needs so that they can reassess your need for medications and monitor your lab values.    Today   CHIEF COMPLAINT:   Chief Complaint  Patient presents with  . Respiratory Arrest    VITAL SIGNS:  Blood pressure 103/61, pulse 79, temperature 99.9 F (  37.7 C), temperature source Axillary, resp. rate 17, height 5\' 7"  (1.702 m), weight 105 kg (231 lb 7.7 oz), SpO2 100 %.  I/O:   Intake/Output Summary (Last 24 hours) at 01/04/15 1403 Last data filed at 01/04/15 0956  Gross per 24 hour  Intake   1700 ml  Output    410 ml  Net   1290 ml    PHYSICAL EXAMINATION:   Physical Exam  Constitutional: critically ill ,patient is On trach collar today Lethargic, sedated this am Head: Normocephalic and atraumatic.  Trach in place , Dobhoff tube in place Eyes: Conjunctivae are normal. Pupils are equal, round, and reactive to light.  Neck: Normal range of motion. Neck supple. No tracheal deviation present. No thyromegaly present. trach collar is connected to vent. Cardiovascular: Normal rate, regular rhythm and normal heart sounds.  Pulmonary/Chest: Bilateral breath sounds present She has no wheezes. Coarse breath sounds at  the bases, scant breath sounds overall Abdominal: Soft. Bowel sounds are normal. She exhibits no distension. There is no tenderness.  Musculoskeletal: Normal range of motion.  Neurological: No cranial nerve deficit. Sedated, Not following commands. Skin: Skin is warm and dry. No rash noted.  Psychiatric: Very confused, alert. Not verbal.   DATA REVIEW:   CBC  Recent Labs Lab 01/03/15 0426  WBC 5.5  HGB 8.9*  HCT 28.9*  PLT 124*    Chemistries   Recent Labs Lab 01/04/15 0345  NA 143  K 3.6  CL 103  CO2 37*  GLUCOSE 122*  BUN 29*  CREATININE 0.92  CALCIUM 10.7*    Cardiac Enzymes No results for input(s): TROPONINI in the last 168 hours.  Microbiology Results  Results for orders placed or performed during the hospital encounter of 12/13/14  Blood Culture (routine x 2)     Status: None   Collection Time: 12/13/14  9:25 AM  Result Value Ref Range Status   Specimen Description BLOOD LEFT ARM  Final   Special Requests BOTTLES DRAWN AEROBIC AND ANAEROBIC  Final   Culture  Setup Time   Final    GRAM POSITIVE COCCI IN CLUSTERS ANAEROBIC BOTTLE ONLY CRITICAL RESULT CALLED TO, READ BACK BY AND VERIFIED WITH: LESLIE LEWIS 12/14/2014 0630 [facility] CONFIRMED BY [facility]    Culture   Final    COAGULASE NEGATIVE STAPHYLOCOCCUS ANAEROBIC BOTTLE ONLY Results consistent with contamination.    Report Status 12/18/2014 FINAL  Final  Blood Culture (routine x 2)     Status: None   Collection Time: 12/13/14  9:35 AM  Result Value Ref Range Status   Specimen Description BLOOD RIGHT FORARM  Final   Special Requests BOTTLES DRAWN AEROBIC AND ANAEROBIC  Final   Culture NO GROWTH 5 DAYS  Final   Report Status 12/18/2014 FINAL  Final  Urine culture     Status: None   Collection Time: 12/13/14  9:35 AM  Result Value Ref Range Status   Specimen Description URINE, RANDOM  Final   Special Requests NONE  Final   Culture   Final    >=100,000 COLONIES/mL ESCHERICHIA COLI ESBL-EXTENDED  SPECTRUM BETA LACTAMASE-THE ORGANISM IS RESISTANT TO PENICILLINS, CEPHALOSPORINS AND AZTREONAM ACCORDING TO CLSI M100-S15 VOL.25 N01 JAN 2005. Results Called to: Derald Macleod AT 0981 12/15/14 CTJ    Report Status 12/15/2014 FINAL  Final   Organism ID, Bacteria ESCHERICHIA COLI  Final      Susceptibility   Escherichia coli - MIC*    AMPICILLIN >=32 RESISTANT Resistant  CEFTAZIDIME 16 RESISTANT Resistant     CEFAZOLIN >=64 RESISTANT Resistant     CEFTRIAXONE 32 RESISTANT Resistant     GENTAMICIN <=1 SENSITIVE Sensitive     IMIPENEM <=0.25 SENSITIVE Sensitive     TRIMETH/SULFA >=320 RESISTANT Resistant     Extended ESBL POSITIVE Resistant     CIPROFLOXACIN Value in next row Sensitive      SENSITIVE<=0.25    NITROFURANTOIN Value in next row Sensitive      SENSITIVE<=16    PIP/TAZO Value in next row Resistant      RESISTANT>=128    ERTAPENEM Value in next row Sensitive      SENSITIVE<=0.5    * >=100,000 COLONIES/mL ESCHERICHIA COLI  MRSA PCR Screening     Status: Abnormal   Collection Time: 12/13/14 12:20 PM  Result Value Ref Range Status   MRSA by PCR POSITIVE (A) NEGATIVE Final    Comment:        The GeneXpert MRSA Assay (FDA approved for NASAL specimens only), is one component of a comprehensive MRSA colonization surveillance program. It is not intended to diagnose MRSA infection nor to guide or monitor treatment for MRSA infections. CRITICAL RESULT CALLED TO, READ BACK BY AND VERIFIED WITH: ADAM SCARBORO  ON 12/13/14 BY HP   Culture, expectorated sputum-assessment     Status: None   Collection Time: 12/15/14 10:49 AM  Result Value Ref Range Status   Specimen Description ENDOTRACHEAL  Final   Special Requests NONE  Final   Sputum evaluation THIS SPECIMEN IS ACCEPTABLE FOR SPUTUM CULTURE  Final   Report Status 12/16/2014 FINAL  Final  Culture, respiratory (NON-Expectorated)     Status: None   Collection Time: 12/15/14 10:49 AM  Result Value Ref Range  Status   Specimen Description ENDOTRACHEAL  Final   Special Requests NONE Reflexed from [id number]  Final   Gram Stain   Final    FEW WBC SEEN FEW GRAM NEGATIVE RODS FEW GRAM POSITIVE COCCI IN PAIRS FAIR SPECIMEN - 70-80% WBCS    Culture   Final    MODERATE GROWTH CITROBACTER SPECIES HEAVY GROWTH PSEUDOMONAS AERUGINOSA MULTI-DRUG RESISTANT ORGANISM CRITICAL RESULT CALLED TO, READ BACK BY AND VERIFIED WITH: [facility] BORBA 12/18/14 1042 JGF    Report Status 12/19/2014 FINAL  Final   Organism ID, Bacteria PSEUDOMONAS AERUGINOSA  Final   Organism ID, Bacteria CITROBACTER SPECIES  Final      Susceptibility   Pseudomonas aeruginosa - MIC*    CEFTAZIDIME >=64 RESISTANT Resistant     CIPROFLOXACIN >=4 RESISTANT Resistant     GENTAMICIN >=16 RESISTANT Resistant     IMIPENEM 1 SENSITIVE Sensitive     PIP/TAZO Value in next row Resistant      RESISTANT>=128    * HEAVY GROWTH PSEUDOMONAS AERUGINOSA   Citrobacter species - MIC*    PIP/TAZO Value in next row Resistant      RESISTANT>=128    CEFTAZIDIME Value in next row Sensitive      SENSITIVE<=4    CEFEPIME Value in next row Sensitive      SENSITIVE<=1    CEFAZOLIN Value in next row Resistant      RESISTANT>=64    CEFTRIAXONE Value in next row Sensitive      SENSITIVE8    CIPROFLOXACIN Value in next row Sensitive      SENSITIVE<=0.25    GENTAMICIN Value in next row Sensitive      SENSITIVE<=1    IMIPENEM Value in next row Sensitive  SENSITIVE<=0.25    TRIMETH/SULFA Value in next row Sensitive      SENSITIVE<=20    * MODERATE GROWTH CITROBACTER SPECIES  Culture, respiratory (NON-Expectorated)     Status: None   Collection Time: 12/23/14 12:00 PM  Result Value Ref Range Status   Specimen Description SPUTUM  Final   Special Requests NONE  Final   Gram Stain   Final    MODERATE WBC SEEN FEW GRAM NEGATIVE RODS EXCELLENT SPECIMEN - 90-100% WBCS    Culture   Final    HEAVY GROWTH PSEUDOMONAS AERUGINOSA This organism isolate is  resistant to one or more antiotic agents in three or more antimicrobial categories.  Suggest Infectious Disease consult.   CRITICAL RESULT CALLED TO, READ BACK BY AND VERIFIED WITH: PAM CRAWFORD AT 1047 12/25/14 DV    Report Status 12/25/2014 FINAL  Final   Organism ID, Bacteria PSEUDOMONAS AERUGINOSA  Final      Susceptibility   Pseudomonas aeruginosa - MIC*    CEFTAZIDIME 16 INTERMEDIATE Intermediate     CIPROFLOXACIN >=4 RESISTANT Resistant     GENTAMICIN >=16 RESISTANT Resistant     IMIPENEM >=16 RESISTANT Resistant     PIP/TAZO Value in next row Sensitive      SENSITIVE32    * HEAVY GROWTH PSEUDOMONAS AERUGINOSA    RADIOLOGY:  Dg Chest Port 1 View  01/03/2015   CLINICAL DATA:  Respiratory failure  EXAM: PORTABLE CHEST 1 VIEW  COMPARISON:  12/31/2014  FINDINGS: Tracheostomy tube and left PICC line remain in place, unchanged. There is cardiomegaly with vascular congestion and bilateral airspace opacities, likely edema/ CHF. Small to moderate bilateral effusions. No significant change.  IMPRESSION: Stable CHF pattern.  Small to moderate bilateral effusions.   Electronically Signed   By: Charlett Nose M.D.   On: 01/03/2015 07:59    EKG:   Orders placed or performed during the hospital encounter of 12/13/14  . EKG 12-Lead  . EKG 12-Lead  . ED EKG 12-Lead  . ED EKG 12-Lead  . EKG 12-Lead  . EKG 12-Lead  . EKG 12-Lead  . EKG 12-Lead      Management plans discussed with the patient, family and they are in agreement.  CODE STATUS:     Code Status Orders        Start     Ordered   12/13/14 1231  Full code   Continuous     12/13/14 1230      TOTAL TIME TAKING CARE OF THIS PATIENT: 38 minutes.    Enid Baas M.D on 01/04/2015 at 2:03 PM  Between 7am to 6pm - Pager - [phone]  After 6pm go to [email or web address] - password [email or web address] [facility]  [facility]  Office  [phone]  CC: Primary care physician; Dorothey Baseman, MD

## 2015-01-04 NOTE — Progress Notes (Signed)
CSW was notified that [facility] has bed available for Pt today. CSW updated Kennyth Arnold with [facility] regarding Pt's feeding tube and she confirmed that it could be addressed at [facility]. CSW updated MD who stated that Pt is cleared for dc to SNF. CSW called and spoke to Pt's son Jomarie Longs who stated that they "have prayed for this bed at [facility]." Pt's familiy is out of town until tomorrow, but available via phone and will stop at [facility] on the way home tomorrow.    CSW contacted [facility] for transport. DC packet prepared. RN to call report to RN at [facility].    No further CSW needs at this time.  Wilford Grist, LCSW [phone]

## 2015-01-04 NOTE — Progress Notes (Signed)
Slept well throughout the night. Drowsy but easily arouses to voice. Follows commands. Afib per cardiac monitor. VSS. Patient on ventilator throughout the night. Urine incontinence from leaking around foley. Foley repositioned and no other issues.

## 2015-01-04 NOTE — Progress Notes (Signed)
Patient seen for routine vent rounds/svn treatment. After suctioning for airway for copious amounts for thick tan plugged secretions patient very agitated. RR increased to 40's. Recover time from distress at no prevail. Therefore placed her back on previous settings 500vt rr16 peep 5 40%. Suctioned airway again for possible plug however nothing was obtained at the time. Patient immediately calmed on rate and tidal volume.

## 2015-01-04 NOTE — Progress Notes (Signed)
Electrolyte replacement consult  9/25 AM BMP K+ 3.4. KCl 20 mEq x1 ordered. Recheck BMP in AM.  9/26 No electrolyte deficiencies noted. BMP in AM.  Fulton Reek, PharmD, BCPS  01/04/2015

## 2015-01-04 NOTE — Care Management Important Message (Signed)
Important Message  Patient Details  Name: TATYANNA CRONK MRN: [id number] Date of Birth: 1945-04-19   Medicare Important Message Given:  Yes-fourth notification given    Marily Memos, RN 01/04/2015, 2:22 PM

## 2015-01-04 NOTE — Progress Notes (Signed)
Nutrition Follow-up     INTERVENTION:   EN: recommend continuing current TF regimen via dobhoff tube as tolerated as per order set  NUTRITION DIAGNOSIS:   Inadequate oral intake related to acute illness as evidenced by NPO status.  GOAL:   Patient will meet greater than or equal to 90% of their needs  MONITOR:    (Energy Intake, Pulmonary, Digestive System, Anthropometrics, Electrolyte/Renal Profile, Glucose Profile)  REASON FOR ASSESSMENT:   Consult Enteral/tube feeding initiation and management  ASSESSMENT:    Pt remains on vent via trach this AM, GI consulted for PEG placement  EN: tolerating Vital High Protein at rate of 55 ml/hr  Electrolyte and Renal Profile:  Recent Labs Lab 01/02/15 0335 01/03/15 0426 01/04/15 0345  BUN 19 26* 29*  CREATININE 0.93 0.92 0.92  NA 145 145 143  K 3.3* 3.4* 3.6   Glucose Profile:  Recent Labs  01/04/15 0005 01/04/15 0400 01/04/15 0733  GLUCAP 103* 104* 98   Meds: lasix, ss novolog   Skin:   (stage II buttock)  Last BM:  9/22  Height:   Ht Readings from Last 1 Encounters:  12/13/14  (1.702 m)    Weight:   Wt Readings from Last 1 Encounters:  01/04/15 231 lb 7.7 oz (105 kg)   Filed Weights   01/02/15 0419 01/03/15 0430 01/04/15 0400  Weight: 227 lb 11.8 oz (103.3 kg) 231 lb 4.2 oz (104.9 kg) 231 lb 7.7 oz (105 kg)    BMI:  Body mass index is 36.25 kg/(m^2).  Estimated Nutritional Needs:   Kcal:  9147-8295 kcals (11-14 kcals/kg)   Protein:  122-153 g (2.0-2.5 g/kg) using IBW 61 kg  Fluid:  1525-1830 mL (25-30 ml/kg)   EDUCATION NEEDS:   No education needs identified at this time  HIGH Care Level  Romelle Starcher MS, RD, LDN [phone] Pager

## 2015-01-04 NOTE — Clinical Social Work Placement (Signed)
   CLINICAL SOCIAL WORK PLACEMENT  NOTE  Date:  01/04/2015  Patient Details  Name: Rachel Brady MRN: [id number] Date of Birth: 02/02/1946  Clinical Social Work is seeking post-discharge placement for this patient at the Skilled  Nursing Facility level of care (*CSW will initial, date and re-position this form in  chart as items are completed):      Patient/family provided with [facility] Clinical Social Work Department's list of facilities offering this level of care within the geographic area requested by the patient (or if unable, by the patient's family).      Patient/family informed of their freedom to choose among providers that offer the needed level of care, that participate in Medicare, Medicaid or managed care program needed by the patient, have an available bed and are willing to accept the patient.      Patient/family informed of [facility]'s ownership interest in [facility] and [facility], as well as of the fact that they are under no obligation to receive care at these facilities.  PASRR submitted to EDS on       PASRR number received on       Existing PASRR number confirmed on 01/04/15     FL2 transmitted to all facilities in geographic area requested by pt/family on       FL2 transmitted to all facilities within larger geographic area on       Patient informed that his/her managed care company has contracts with or will negotiate with certain facilities, including the following:        Yes   Patient/family informed of bed offers received.  Patient chooses bed at [facility]     Physician recommends and patient chooses bed at      Patient to be transferred to [facility] on 01/04/15.  Patient to be transferred to facility by [facility]     Patient family notified on 01/04/15 of transfer.  Name of family member notified:  son Jomarie Longs     PHYSICIAN       Additional Comment:     _______________________________________________ Ned Card, LCSW 01/04/2015, 3:32 PM

## 2015-01-05 DIAGNOSIS — R0600 Dyspnea, unspecified: Secondary | ICD-10-CM | POA: Insufficient documentation

## 2015-01-05 NOTE — Progress Notes (Signed)
Palliative Medicine Inpatient Consult Follow Up Note   Name: Rachel Brady Date: 01/05/2015 MRN: [id number]  DOB: 1945-10-14  Referring Physician: No att. providers found  Palliative Care consult requested for this 69 y.o. female for goals of medical therapy in patient with acute on chronic respiratory failure and a number of severe chronic conditions as detailed as below. Pt was admitted here on 12/13/14 and it appears that she is going to need chronic nightly ventilation somewhat long term. She had resided at [facility] just a short time. This was following an LTAC stay at [facility]. I have just been informed she will have a bed today at [facility] level of care for stable vent patients.   IMPRESSION: 1. 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 UTI with resultant sepsis ---ESBL e coli bacteria = causative organism 3. Respiratory arrest (she quit breathing in ambulance and was bagged ) 4. Cardia arrest on 12/27/14 with full code blue (EPI X plus CPR resulted in recovery)  5. H/O substance abuse but details are unclear 6. Essential HTN 7. Obesity 8. H/O major depressive disorder 9. CKD stage 3 10. DM2 11.Atrial Fibrillation 12. GERD 13. Metabolic Encephalopathy from chronic and acute illness 14. Tachycardia ---due to demand ischemia 15. H/O Aortic Stenosis s/p bioprosthetic valve replacement  17. CAD with CABG at same time as valve  replacement 18. Bradycardia   TODAYS PLAN: I had told pt's stepson that I would continue to see pt (or at least see her today) since he expressed an interest in talking with me again about her condition (when I spoke with him at great length last Friday). I am aware now that she is being transferred and do not have any recommendations at this point.  The stepson will likely want her to remain full code unless she hersefl wakes up and states she wants something different.    REVIEW OF SYSTEMS:  Patient is not able to provide ROS  CODE STATUS: Full code  --per directive that pt reportedly gave to her stepson who is primary care Jakelyn Squyres and decision maker for pt when she cannot make her own decisions.     PAST MEDICAL HISTORY: Past Medical History  Diagnosis Date  . Tracheostomy in place   . Diabetes   . CKD (chronic kidney disease)   . OSA (obstructive sleep apnea)   . HTN (hypertension)   . MDD (major depressive disorder)     PAST SURGICAL HISTORY: History reviewed. No pertinent past surgical history.  Vital Signs: BP 141/87 mmHg  Pulse 87  Temp(Src) 99.9 F (37.7 C) (Rectal)  Resp 15  Ht  (1.702 m)  Wt 105 kg (231 lb 7.7 oz)  BMI 36.25 kg/m2  SpO2 100% Filed Weights   01/02/15 0419 01/03/15 0430 01/04/15 0400  Weight: 103.3 kg (227 lb 11.8 oz) 104.9 kg (231 lb 4.2 oz) 105 kg (231 lb 7.7 oz)    Estimated body mass index is 36.25 kg/(m^2) as calculated from the following:   Height as of this  encounter:  (1.702 m).   Weight as of this encounter: 105 kg (231 lb 7.7 oz).  PHYSICAL EXAM: Sleeping Trached and intubated via trache Hrt irreg irreg no mgr Lungs with vent sounds and decreased BS  Abd soft with nl BS Ext skin warm and dry   LABS: CBC:    Component Value Date/Time   WBC 5.5 01/03/2015 0426   WBC 13.8* 08/02/2014 1918   HGB 8.9* 01/03/2015 0426   HGB 9.6* 08/02/2014 1918   HCT 28.9* 01/03/2015 0426   HCT 30.2* 08/02/2014 1918   PLT 124*  01/03/2015 0426   PLT 190 08/02/2014 1918   MCV 85.4 01/03/2015 0426   MCV 91 08/02/2014 1918   NEUTROABS 8.2* 12/13/2014 0925   NEUTROABS 12.8* 08/02/2014 1918   LYMPHSABS 0.3* 12/13/2014 0925   LYMPHSABS 0.2* 08/02/2014 1918   MONOABS 0.3 12/13/2014 0925   MONOABS 0.8 08/02/2014 1918   EOSABS 0.1 12/13/2014 0925   EOSABS 0.0 08/02/2014 1918   BASOSABS 0.0 12/13/2014 0925   BASOSABS 0.0 08/02/2014 1918   Comprehensive Metabolic Panel:    Component Value Date/Time   NA 143 01/04/2015 0345   NA 134* 08/02/2014 1918   K 3.6 01/04/2015 0345   K 5.9* 08/02/2014 1918   CL 103 01/04/2015 0345   CL 95* 08/02/2014 1918   CO2 37* 01/04/2015 0345   CO2 31 08/02/2014 1918   BUN 29* 01/04/2015 0345   BUN 44* 08/02/2014 1918   CREATININE 0.92 01/04/2015 0345   CREATININE 1.61* 08/02/2014 1918   GLUCOSE 122* 01/04/2015 0345   GLUCOSE 158* 08/02/2014 1918   CALCIUM 10.7* 01/04/2015 0345   CALCIUM 10.9* 08/02/2014 1918   CALCIUM 9.6 04/23/2011 0539   AST 32 12/13/2014 0925   AST 33 08/02/2014 1918   ALT 87* 12/13/2014 0925   ALT 27 08/02/2014 1918   ALKPHOS 128* 12/13/2014 0925   ALKPHOS 133* 08/02/2014 1918   BILITOT 0.5 12/13/2014 0925   BILITOT 0.5 08/02/2014 1918   PROT 6.8 12/13/2014 0925   PROT 7.2 08/02/2014 1918   ALBUMIN 3.1* 12/13/2014 0925   ALBUMIN 3.0* 08/02/2014 1918    Time Spent: 15 minutes

## 2015-03-11 DEATH — deceased
